# Patient Record
Sex: Female | Born: 1965
Health system: Southern US, Community
[De-identification: ages and names within clinical notes are randomized; demographics above are authoritative.]

## PROBLEM LIST (undated history)

## (undated) DIAGNOSIS — Z9889 Other specified postprocedural states: Secondary | ICD-10-CM

## (undated) DIAGNOSIS — F32A Depression, unspecified: Secondary | ICD-10-CM

## (undated) DIAGNOSIS — R112 Nausea with vomiting, unspecified: Secondary | ICD-10-CM

## (undated) DIAGNOSIS — K2270 Barrett's esophagus without dysplasia: Secondary | ICD-10-CM

## (undated) DIAGNOSIS — F329 Major depressive disorder, single episode, unspecified: Secondary | ICD-10-CM

## (undated) DIAGNOSIS — J449 Chronic obstructive pulmonary disease, unspecified: Secondary | ICD-10-CM

## (undated) DIAGNOSIS — I10 Essential (primary) hypertension: Secondary | ICD-10-CM

## (undated) DIAGNOSIS — J302 Other seasonal allergic rhinitis: Secondary | ICD-10-CM

## (undated) HISTORY — PX: CHOLECYSTECTOMY: SHX55

## (undated) HISTORY — DX: Barrett's esophagus without dysplasia: K22.70

## (undated) HISTORY — PX: ESOPHAGOGASTRODUODENOSCOPY: SHX1529

## (undated) HISTORY — PX: TUBAL LIGATION: SHX77

## (undated) HISTORY — PX: ABDOMINAL HYSTERECTOMY: SHX81

---

## 2000-12-04 ENCOUNTER — Other Ambulatory Visit: Admission: RE | Admit: 2000-12-04 | Discharge: 2000-12-04 | Payer: Self-pay | Admitting: Obstetrics and Gynecology

## 2001-01-19 ENCOUNTER — Encounter (INDEPENDENT_AMBULATORY_CARE_PROVIDER_SITE_OTHER): Payer: Self-pay

## 2001-01-19 ENCOUNTER — Ambulatory Visit (HOSPITAL_COMMUNITY): Admission: RE | Admit: 2001-01-19 | Discharge: 2001-01-19 | Payer: Self-pay | Admitting: Obstetrics and Gynecology

## 2001-06-09 ENCOUNTER — Observation Stay (HOSPITAL_COMMUNITY): Admission: RE | Admit: 2001-06-09 | Discharge: 2001-06-10 | Payer: Self-pay | Admitting: Obstetrics and Gynecology

## 2001-06-09 ENCOUNTER — Encounter (INDEPENDENT_AMBULATORY_CARE_PROVIDER_SITE_OTHER): Payer: Self-pay | Admitting: Specialist

## 2001-09-24 ENCOUNTER — Ambulatory Visit (HOSPITAL_COMMUNITY): Admission: RE | Admit: 2001-09-24 | Discharge: 2001-09-24 | Payer: Self-pay | Admitting: Unknown Physician Specialty

## 2001-09-24 ENCOUNTER — Encounter: Payer: Self-pay | Admitting: Unknown Physician Specialty

## 2003-04-11 ENCOUNTER — Emergency Department (HOSPITAL_COMMUNITY): Admission: EM | Admit: 2003-04-11 | Discharge: 2003-04-11 | Payer: Self-pay | Admitting: Emergency Medicine

## 2003-05-18 ENCOUNTER — Other Ambulatory Visit (HOSPITAL_COMMUNITY): Admission: RE | Admit: 2003-05-18 | Discharge: 2003-05-31 | Payer: Self-pay | Admitting: Psychiatry

## 2003-10-17 ENCOUNTER — Other Ambulatory Visit (HOSPITAL_COMMUNITY): Admission: RE | Admit: 2003-10-17 | Discharge: 2003-10-31 | Payer: Self-pay | Admitting: Psychiatry

## 2003-11-14 ENCOUNTER — Ambulatory Visit (HOSPITAL_COMMUNITY): Admission: RE | Admit: 2003-11-14 | Discharge: 2003-11-14 | Payer: Self-pay | Admitting: Family Medicine

## 2004-04-26 ENCOUNTER — Ambulatory Visit (HOSPITAL_COMMUNITY): Admission: RE | Admit: 2004-04-26 | Discharge: 2004-04-26 | Payer: Self-pay | Admitting: Unknown Physician Specialty

## 2004-05-14 ENCOUNTER — Ambulatory Visit (HOSPITAL_COMMUNITY): Admission: RE | Admit: 2004-05-14 | Discharge: 2004-05-14 | Payer: Self-pay | Admitting: Unknown Physician Specialty

## 2004-06-13 ENCOUNTER — Ambulatory Visit (HOSPITAL_COMMUNITY): Admission: RE | Admit: 2004-06-13 | Discharge: 2004-06-13 | Payer: Self-pay | Admitting: Neurology

## 2004-09-10 ENCOUNTER — Inpatient Hospital Stay (HOSPITAL_COMMUNITY): Admission: AD | Admit: 2004-09-10 | Discharge: 2004-09-12 | Payer: Self-pay | Admitting: Family Medicine

## 2004-12-19 ENCOUNTER — Ambulatory Visit (HOSPITAL_COMMUNITY): Admission: RE | Admit: 2004-12-19 | Discharge: 2004-12-19 | Payer: Self-pay | Admitting: Preventative Medicine

## 2005-03-05 ENCOUNTER — Ambulatory Visit (HOSPITAL_COMMUNITY): Admission: RE | Admit: 2005-03-05 | Discharge: 2005-03-05 | Payer: Self-pay | Admitting: Family Medicine

## 2005-06-06 ENCOUNTER — Ambulatory Visit (HOSPITAL_COMMUNITY): Admission: RE | Admit: 2005-06-06 | Discharge: 2005-06-06 | Payer: Self-pay | Admitting: Family Medicine

## 2005-06-17 ENCOUNTER — Ambulatory Visit (HOSPITAL_COMMUNITY): Admission: RE | Admit: 2005-06-17 | Discharge: 2005-06-17 | Payer: Self-pay | Admitting: Family Medicine

## 2005-07-31 ENCOUNTER — Inpatient Hospital Stay (HOSPITAL_COMMUNITY): Admission: AD | Admit: 2005-07-31 | Discharge: 2005-08-02 | Payer: Self-pay | Admitting: Family Medicine

## 2006-01-09 ENCOUNTER — Ambulatory Visit (HOSPITAL_COMMUNITY): Admission: RE | Admit: 2006-01-09 | Discharge: 2006-01-09 | Payer: Self-pay | Admitting: Family Medicine

## 2006-06-04 ENCOUNTER — Inpatient Hospital Stay (HOSPITAL_COMMUNITY): Admission: AD | Admit: 2006-06-04 | Discharge: 2006-06-06 | Payer: Self-pay | Admitting: Family Medicine

## 2006-09-01 ENCOUNTER — Ambulatory Visit (HOSPITAL_COMMUNITY): Admission: RE | Admit: 2006-09-01 | Discharge: 2006-09-01 | Payer: Self-pay | Admitting: Family Medicine

## 2010-10-14 ENCOUNTER — Encounter: Payer: Self-pay | Admitting: Family Medicine

## 2011-02-08 NOTE — H&P (Signed)
NAMEKRISLYN, [name]                 ACCOUNT NO.:  [id number]   MEDICAL RECORD NO.:  [id number]          PATIENT TYPE:  INP   LOCATION:  A315                          FACILITY:  [facility]   PHYSICIAN:  Donna Bernard, M.D.DATE OF BIRTH:  1966-04-15   DATE OF ADMISSION:  09/10/2004  DATE OF DISCHARGE:  LH                                HISTORY & PHYSICAL   CHIEF COMPLAINT:  Can't breathe..   SUBJECTIVE:  This patient is a 45 year old female with a history of known  asthma and an unfortunate chronic smoking habit, who arrives into the office  the date of admission with complaints of trouble breathing.  The patient has  been seen twice over the past couple of weeks with chest cold-type symptoms.  Most recently she was given a course of Levaquin along with prednisone  taper, along with frequent Ventolin via metered-dose inhaler along with  p.r.n. nebulizer treatment.  The patient states the last two nights she has  slept sitting up.  She has air hunger.  She basically was afraid last night  that she would have to go to the emergency room.  She presents to the office  today and was seen by our nurse practitioner and given a nebulizer treatment  and continued to remain quite tight after this.  The patient reports no  significant fever.  She has had a cough that is productive of yellowish  phlegm at times.  The patient claims compliance with her current  medications, which include Zoloft 200 mg daily, Lamictal 100 mg b.i.d.,  Seroquel 100 mg p.o. q.a.m. and 300 mg p.o. q.h.s., Advair 250/50 mcg one  puff b.i.d., Vistaril 25 mg up to q.i.d. p.r.n. for anxiety.   Prior surgeries:  Remote cholecystectomy, hysterectomy, tubal ligation.   FAMILY HISTORY:  Positive for hypertension and colon cancer.   ALLERGIES:  PENICILLIN, rash; ZANTAC, rash.   Occupation:  Works at [employer].  Tobacco use:  Positive at  approximately a pack a day.  Alcohol intake:  None significant.  No drug  use.   REVIEW OF SYSTEMS:  Otherwise negative.   PHYSICAL EXAMINATION:  VITAL SIGNS:  BP 140/90, afebrile.  GENERAL:  Alert.  Increased respiratory rate.  HEENT:  Moderate nasal congestion.  TMs normal.  Pharynx normal.  NECK:  Supple.  LUNGS:  Impressive expiratory wheezes with minimal change after nebulizer  treatment.  CARDIAC:  Slight tachycardia.  Positive mild flow murmur.  ABDOMEN:  Soft.  EXTREMITIES:  Normal.   SIGNIFICANT LABORATORY DATA:  Chest x-ray:  Chronic bronchitis-type changes.  No true infiltrate.  CBC:  White blood count elevated at 15,000.  O2  saturation is pending.   IMPRESSION:  Exacerbation of asthma with bronchitis equivalent and failure  on outpatient therapy.   PLAN:  Admit for IV antibiotics, steroids, frequent nebulizer treatments.  Further orders as noted in the chart.     [name]   WSL/MEDQ  D:  09/10/2004  T:  09/11/2004  Job:  [id number]

## 2011-02-08 NOTE — Op Note (Signed)
[facility] of [facility]  Patient:    [name], [name] Visit Number: [id number] MRN: [id number]          Service Type: OBV Location: 9300 [id number] Attending Physician:  Rhina Brackett Dictated by:   Duke Salvia. Marcelle Overlie, M.D. Proc. Date: 06/09/01 Admit Date:  06/09/2001                             Operative Report  PREOPERATIVE DIAGNOSES:       Abnormal uterine bleeding unresponsive to conservative measures, endometriosis.  POSTOPERATIVE DIAGNOSES:      Abnormal uterine bleeding unresponsive to conservative measures, endometriosis.  PROCEDURE:                    Diagnostic laparoscopy followed by total vaginal hysterectomy.  SURGEON:                      Duke Salvia. Marcelle Overlie, M.D.  ASSISTANT:                    Juluis Mire, M.D.  ANESTHESIA:                   General endotracheal.  COMPLICATIONS:                None.  DRAINS:                       Foley catheter.  ESTIMATED BLOOD LOSS:         150 cc.  PROCEDURE:                    ______ patient to the operating room after an adequate level of general endotracheal anesthesia was obtained, with the patients legs in stirrups the abdomen, perineum, and vagina were prepped and draped in the usual manner for laparoscopy, vaginal hysterectomy.  With the legs in stirrups Foley catheter was positioned draining clear urine.  After EUA revealed the uterus to be mid position, mobile, adnexa negative. Attention was directed to the laparoscopy portion of the procedure.  A 2 cm subumbilical incision was made.  The Veress needle was introduced without difficulty.  Its intra-abdominal position was verified by pressure and water testing.  After a 2 L pneumoperitoneum was then created laparoscopic trocar and ______ were then introduced without difficulty.  In Trendelenburg the patient was noted to have minimal fibrotic areas around the left uterosacral ligament that were noted previously.  The previously noted  omental adhesions and to the area of the left adnexa were free and clear.  Both ovaries were unremarkable.  Decision was made to proceed with straight TVH at that point. The legs were extended.  Weighted speculum was positioned.  Cervical vaginal mucosa was incised.  Posterior culdotomy performed without difficulty.  The left uterosacral ligament was then clamped, divided, and suture ligated with 0 Dexon in a Heaney fashion, held temporarily, the same repeated on the opposite side.  The bladder was advanced superiorly with sharp and blunt dissection until the anterior peritoneum could be identified.  This was entered without difficulty.  Retractor used to gently elevate the bladder out of the field. In a sequential manner staying close to the uterus, the cardinal ligament, uterine vasculature pedicles, and upper broad ligament and pedicles were clamped, divided, and suture ligated with 0 Dexon.  The fundus of the uterus was then delivered posteriorly.  The utero-ovarian pedicles were clamped, divided, first free tie followed by suture ligature of 0 Dexon.  Both ovarian pedicles were hemostatic.  Cuff was closed with running locked 2-0 Dexon suture.  All major pedicles were inspected and were hemostatic.  McCalls culdoplasty suture was placed from left uterosacral ligament picking up posterior peritoneum across to the right uterosacral ligament and tied down. Prior to closure sponge, needle, and instrument counts were reported as correct x 2.  The cuff closed from right to left with a figure-of-eight 2-0 Monocryl sutures.  Catheter was repositioned again draining clear urine.  She was reinsufflated and inspected.  The area of the vaginal cuff was hemostatic after irrigation and aspiration.  Pneumoperitoneum was deflated and the laparoscopy incision closed with a 2-0 Dexon subcuticular.  She tolerated this well.  Went to recovery room in good condition. Dictated by:   Duke Salvia. Marcelle Overlie,  M.D. Attending Physician:  Rhina Brackett DD:  06/09/01 TD:  06/09/01 Job: [id number]

## 2011-02-08 NOTE — Op Note (Signed)
[facility] of [facility]  Patient:    [name], [name]                        MRN: [id number] Proc. Date: 01/19/01 Adm. Date:  [id number] Attending:  Rhina Brackett                           Operative Report  PREOPERATIVE DIAGNOSIS:       Abnormal uterine bleeding, chronic pelvic pain.  POSTOPERATIVE DIAGNOSIS:      Abnormal uterine bleeding, chronic pelvic pain,                               plus left adnexal adhesions.  OPERATION:  SURGEON:                      Duke Salvia. Marcelle Overlie, M.D.  ANESTHESIA:                   General endotracheal.  COMPLICATIONS:                None.  DRAINS:                       In and out catheter.  DESCRIPTION OF PROCEDURE AND FINDINGS:                 The patient was taken to the operating room. After an adequate level of general endotracheal anesthesia was obtained with the legs in the stirrups, the abdomen, perineum, and vagina were prepped and draped in the usual manner for laparoscopy/hysteroscopy.  The bladder was drained and EUA carried out.  The uterus was in mid position, normal size, and adnexa negative.  A weighted speculum was positioned.  The cervix was grasped with a tenaculum and was sounded to 8 cm, progressively dilated to a 27 Pratt dilator.  The diagnostic hysteroscope was then inserted without difficulty revealing two normal ostia.  The fundus was normal.  The cavity appeared to be entirely normal.  Brief D&C carried out just to obtain an endometrial biopsy. The Hulka tenaculum was positioned and attention directed to the abdomen where a 2 cm subumbilical incision was made.  This was done after infiltrating with 0.5% Marcaine plain.  The Veress needle was introduced without difficulty. Its intra-abdominal position was verified by pressure and water testing. After a 2 L pneumoperitoneum was created, the laparoscopic trocar and sleeve were then introduced.  Three fingerbreadths above the symphysis in the  midline, a second puncture was made with a 5 mm trocar, and a blunt probe was inserted.  This was done under direct visualization.  With the patient in Trendelenburg and the uterus anteflexed, the pelvic findings were as follows; the anterior space was unremarkable.  The uterus itself was normal size and contour.  The right tube and ovary were normal.  Both tubes had been surgically interrupted with fallopian rings.  The right pelvic side wall was unremarkable.  The cul-de-sac was free and clear.  There was one area lateral to the left uterosacral ligament showing some old fibrosis that may have been secondary to prior endometriosis.  There was a solitary omental adhesion that appeared to be avascular and to the area of the fallopian ring which was lysed in a vascular plane.  Once this was freed up, the tube and  ovary were inspected and noted to be unremarkable except for evidence of the prior tubal.  The appendix and upper abdomen were unremarkable.  After these findings were noted and the solitary adhesion was lysed, instruments were removed, gas allowed to escape, and the defects closed with 4-0 Dexon subcuticular sutures.  She tolerated this well and went to the recovery room in good condition. DD:  01/19/01 TD:  01/19/01 Job: [id number]

## 2011-02-08 NOTE — H&P (Signed)
[name], [name]                 ACCOUNT NO.:  [id number]   MEDICAL RECORD NO.:  [id number]          PATIENT TYPE:  INP   LOCATION:  A321                          FACILITY:  [facility]   PHYSICIAN:  [name], M.D.DATE OF BIRTH:  28-Dec-1965   DATE OF ADMISSION:  07/31/2005  DATE OF DISCHARGE:  LH                                HISTORY & PHYSICAL   CHIEF COMPLAINT:  Coughing, wheezing, cannot breathe.   SUBJECTIVE:  This patient is a 45 year old white female with a history of  known asthma and bipolar disease who presents to the office the day of  admission with complaints of severe shortness of breath.  The patient was  seen in the office two days prior.  At that time, she was having cough,  wheezing, congestion, sore throat.  She was noted to be wheezing  significantly.  She had come off her Advair recently.  Dr. Lorin Picket placed her  on Levaquin 750 mg daily.  The patient was placed on a prednisone taper.  She was encouraged to get back on her Advair, she was encouraged to stop  smoking.  The next 48 hours, the patient continued to worsen.  She has had  significant coughing.  She states she has been up all night wheezing.  She  is compliant with her other medications.  The psychotropic medications are  administered via the psychiatrist.   CURRENT MEDICATIONS:  1.  Zoloft 200 mg daily.  2.  Lamictal 200 mg b.i.d.  3.  Seroquel 50 mg q.a.m. and at lunch, at 4 p.m., 300 mg q.h.s.  4.  Advair 250/50 one puff b.i.d.  5.  Depakote 500 mg q.h.s.  6.  Cogentin 2 mg one p.o. q.h.s.   PAST SURGICAL HISTORY:  1.  Remote cholecystectomy.  2.  Hysterectomy.  3.  Tubal ligation.   FAMILY HISTORY:  Significant for colon cancer, hypertension.   ALLERGIES:  PENICILLIN.   SOCIAL HISTORY:  The patient smokes a pack per day.  She works at  [employer].  No alcohol abuse.   REVIEW OF SYSTEMS:  Otherwise negative.   PHYSICAL EXAMINATION:  VITAL SIGNS:  Temperature 99.4, blood pressure  130/80.  HEENT:  Moderate nasal congestion.  Pharynx normal.  NECK:  Supple.  LUNGS:  Positive tachypnea, positive expiratory wheezes, some inspiratory  crackles.  HEART:  Regular rate and rhythm.  ABDOMEN:  Soft.  EXTREMITIES:  Normal.  NEUROLOGIC:  Intact.   LABORATORY DATA:  Blood work pending.  Chest x-ray shows bronchial asthmatic  changes.   IMPRESSION:  1.  Exacerbation of asthma with bronchitis, failure on outpatient therapy.  2.  Bipolar disease.  3.  Chronic smoker.   PLAN:  The patient is encouraged to quit smoking again.  Admit for  intravenous fluids, steroid therapy, frequent nebulizer treatments, further  orders as noted in the chart.      [name], M.D.  Electronically Signed     WSL/MEDQ  D:  07/31/2005  T:  07/31/2005  Job:  [id number]

## 2011-02-08 NOTE — H&P (Signed)
[facility]  Patient:    [name], [name] Visit Number: [id number] MRN: [id number]          Service Type: Attending:  Duke Salvia. Marcelle Overlie, M.D. Dictated by:   Duke Salvia. Marcelle Overlie, M.D. Adm. Date:  06/09/01                           History and Physical  CHIEF COMPLAINT:              Abnormal uterine bleeding unresponsive to conservative measures.  HISTORY OF PRESENT ILLNESS:   A 45 year old G2, P2 prior tubal ligation who has had a one year history of pelvic pain and heavy irregular bleeding that did not respond to OCPs.  In April 2002 she underwent diagnostic laparoscopy and hysteroscopy.  Findings were normal except for some minimal fibrosis around the left uterosacral ligament suggestive of endometriosis and some omental adhesions at the site of her prior Falope ring application.  Due to the significant problems she continues to have, she presents for definitive LAVH.  ALLERGIES:                    PENICILLIN, BENADRYL, CODEINE PRODUCTS.  PAST OBSTETRICAL HISTORY:     Two vaginal deliveries at term.  PAST SURGICAL HISTORY:        Tubal ligation, cholecystectomy in 1995, laparoscopy, hysteroscopy.  REVIEW OF SYSTEMS:            Significant for mild asthma for which she uses an inhaler p.r.n.  MEDICATIONS:                  No regular medicines.  PHYSICAL EXAMINATION  VITAL SIGNS:                  Temperature 98.2, blood pressure 120/78.  HEENT:                        Unremarkable.  NECK:                         Supple without masses.  LUNGS:                        Clear.  CARDIOVASCULAR:               Regular rate and rhythm without murmurs, rubs, or gallops.  BREASTS:                      Without masses.  ABDOMEN:                      Soft, flat, nontender.  PELVIC:                       Normal external genitalia.  Vagina and cervix: Clear.  Uterus:  Mid position, normal size.  Adnexa:  Negative.  IMPRESSION:                   1.  Chronic pelvic pain, abnormal uterine                                  bleeding unresponsive to conservative  measures.                               2. Pelvic adhesions noted on recent laparoscopy                                  and also evidence of endometriosis.  PLAN:                         LAVH.  Procedure and risks reviewed as above. Dictated by:   Duke Salvia. Marcelle Overlie, M.D. Attending:  Duke Salvia. Marcelle Overlie, M.D. DD:  06/04/01 TD:  06/04/01 Job: [id number]

## 2011-02-08 NOTE — Discharge Summary (Signed)
NAMESVEA, PUSCH                 ACCOUNT NO.:  [id number]   MEDICAL RECORD NO.:  [id number]          PATIENT TYPE:  INP   LOCATION:  A321                          FACILITY:  [facility]   PHYSICIAN:  Scott A. Gerda Diss, MD    DATE OF BIRTH:  [birth year]   DATE OF ADMISSION:  07/31/2005  DATE OF DISCHARGE:  11/10/2006LH                                 DISCHARGE SUMMARY   DISCHARGE DIAGNOSES:  1.  Asthma.  2.  Chronic obstructive pulmonary disease.  3.  Tobacco use.   HOSPITAL COURSE:  A 45 year old female admitted in with severe coughing,  congestion and wheezing with some relative hypoxia also having difficulty  with breathing.  She had been treated as an outpatient for the past couple  of weeks with frequent rounds of steroids and nebulizer treatments without  success.  The patient does smoke.  She understands that it would be in her  best interest to quit smoking and that she is destine to have severe lung  problems as she goes on if she does not.  No pneumonia was seen.  The  patient responded very well to the IV steroids and nebulizer treatments and  was felt the patient was stable on November 10, to go home.  She was  discharged on nebulizer treatments as well as steroids and Zithromax.  She  is to follow up in 3-4 days if not continuing to stay dramatically stay  well.  If she continues on her present course and does really well, then she  is to follow up in 1 week to be rechecked.   DISCHARGE MEDICATIONS:  1.  Prednisone taper.  When she finishes, go to Advair twice daily.  2.  Xopenex, that she has at home, every 6-8 hours.  3.  Albuterol inhaler as needed when she is out and about.  4.  Zithromax for the next 5 days as directed.   SPECIAL INSTRUCTIONS:  She is not to smoke.  She was instructed to call us  if she gets worse.      Scott A. Gerda Diss, MD  Electronically Signed     SAL/MEDQ  D:  08/02/2005  T:  08/02/2005  Job:  [id number]

## 2011-02-08 NOTE — Discharge Summary (Signed)
[facility]  Patient:    [name], [name] Visit Number: [id number] MRN: [id number]          Service Type: OBV Location: 9300 [id number] [id number] Attending Physician:  Rhina Brackett Dictated by:   Duke Salvia. Marcelle Overlie, M.D. Admit Date:  06/09/2001 Disc. Date: 06/10/01                             Discharge Summary  DISCHARGE DIAGNOSES:          1. Abnormal uterine bleeding.                               2. Diagnostic laparoscopy followed by                                  total vaginal hysterectomy this admission.  SUMMARY OF THE HISTORY AND PHYSICAL EXAMINATION:     Please see admission history and physical for details. Briefly, this is a 45 year old, G2, P2, prior tubal with a one-year history of pelvic pain and heavy irregular bleeding, previous laparoscopy earlier this year showing some adnexal adhesions and fibrotic changes of old endometriosis. She presents now for definitive hysterectomy.  HOSPITAL COURSE:              On June 09, 2001 under general anesthesia, the patient underwent laparoscopy followed by [facility]. On the first postoperative day she was tolerating a regular diet and was afebrile. The catheter had been removed the night before. She was voiding without difficulty, felt well, and was ready for discharge at that point. Her abdominal exam was unremarkable.  LABORATORY DATA:              Preoperative hemoglobin 14.7, postoperative on September 18 was 10.8. Blood type was A positive, antibody screen negative. Admission UA was negative. Pathology report is still pending.  DISPOSITION:                  The patient was discharged.  DISCHARGE MEDICATIONS:        1. Tylox p.r.n. pain.                               2. Hemocyte once daily for a month.  DISCHARGE FOLLOWUP:           The patient  is to return to the office in one week.  DISCHARGE INSTRUCTIONS:       She was advised to report any incisional redness or drainage, increased  pain or bleeding, or fever over 101. She was given specific instructions regarding diet, sex, and exercise.  CONDITION:                    Good.  ACTIVITY:                     Gradually increase. Dictated by:   Duke Salvia. Marcelle Overlie, M.D. Attending Physician:  Rhina Brackett DD:  06/10/01 TD:  06/10/01 Job: [phone] [id number]

## 2011-02-08 NOTE — Discharge Summary (Signed)
[name], [name]                 ACCOUNT NO.:  [id number]   MEDICAL RECORD NO.:  [id number]          PATIENT TYPE:  INP   LOCATION:  A315                          FACILITY:  [facility]   PHYSICIAN:  Scott A. Gerda Diss, MD    DATE OF BIRTH:  11-Sep-1966   DATE OF ADMISSION:  DATE OF DISCHARGE:  12/21/2005LH                                 DISCHARGE SUMMARY   DISCHARGE DIAGNOSES:  1.  Walking pneumonia.  2.  Reactive airway/asthma flare-up.  3.  Hypoxia secondary to the above.   HOSPITAL COURSE:  This 45 year old white female was admitted in after having  a couple of week's worth of coughing and congestion, sinus drainage, being  treated as an outpatient with albuterol, prednisone and Levaquin.  She came  to the office in respiratory distress, progressively worse.  Failed  outpatient management.  Was admitted in on Solu-Medrol nebulization  treatments and antibiotics.  She gradually improved over the course of the  next several days.  Her x-ray did not show pneumonia, but showed bronchitic  changes.  Blood work was within order.  O2 saturations 91.  Placed on two  liters on the afternoon of the 20th and improved to the point where she did  not need steroids.  The patient states that she has decided to quit smoking.  She was advised she could use a patch if she wished.  It was felt that this  certainly was a good choice for her.  She was encouraged to follow up in our  office within two weeks, and sooner if any problems.   DISCHARGE CONDITION:  She was discharged in good condition.     Scot   SAL/MEDQ  D:  09/12/2004  T:  09/12/2004  Job:  [id number]

## 2011-02-08 NOTE — Discharge Summary (Signed)
[name], [name]                 ACCOUNT NO.:  [id number]   MEDICAL RECORD NO.:  [id number]          PATIENT TYPE:  INP   LOCATION:  A302                          FACILITY:  [facility]   PHYSICIAN:  Scott A. Gerda Diss, MD    DATE OF BIRTH:  [birth year]   DATE OF ADMISSION:  06/04/2006  DATE OF DISCHARGE:  09/14/2007LH                                 DISCHARGE SUMMARY   DISCHARGE DIAGNOSES:  1. Exacerbation of asthma with failure of outpatient therapy.  2. Allergic rhinitis.  3. Bronchitis.  4. Bipolar disease.   HOSPITAL COURSE:  The patient was admitted in with severe wheezing and  difficulty breathing.  She is a smoker.  She had been seen 3 times over 6  days in the office with severe wheezing and coughing, and she had been using  her neb treatments every couple of hours (Xopenex) without success.  She has  been on a shot of Depo-Medrol in the past and has been on Avelox and  prednisone and despite all this was getting worse.   DISCHARGE MEDICATIONS:  1. Prednisone taper from 60 mg to 20 mg over a 9-day course.  2. Zoloft 100 mg daily.  3. Lamictal 100 mg q.a.m.  4. Seroquel 100 mg q.h.s.  5. Depakote ER 1,000 mg q.h.s.  6. Singulair 10 mg daily.  7. Advair 500/50, one inhalation b.i.d.  8. Chantix.   The patient was encouraged to quit smoking.  She may also use a Xopenex as  frequently as necessary.  She is to follow up in the office within 7-14  days, plus also she is to follow up sooner if worse.  In addition, to this  the patient was also encouraged to notify us if any particular problems  should arise.   HOSPITAL COURSE:  The patient was treated with a combination of Xopenex,  Levaquin, and her usual medications and Advair and gradually improved over  the course of the next couple of days and was clear by June 06, 2006,  and stable for discharge.      Scott A. Gerda Diss, MD  Electronically Signed    SAL/MEDQ  D:  06/06/2006  T:  06/06/2006  Job:  [id number]

## 2011-02-08 NOTE — H&P (Signed)
[facility]  Patient:    [name], [name]                          MRN: [id number] Adm. Date:  01/19/01 Attending:  Duke Salvia. Marcelle Overlie, M.D.                         History and Physical  CHIEF COMPLAINT:  Menometrorrhagia, dysmenorrhea, history of endometriosis.  HISTORY OF PRESENT ILLNESS:  A 45 year old G2, P2, history of prior tubal ligation, was seen over the last six to nine months by several physicians with complaints of irregular bleeding and tried on different OCPs without significant improvement.  At the time of her tubal in 1996, she was told she had endometriosis.  Her problems lately have been worsening dysmenorrhea and heavy, irregular bleeding.  When I first saw her March 2002, her exam was normal.  At that time, we discussed several options with her, including Lupron or laparoscopy or further attempted medical management, which she declined. She has required Vioxx for pain and Loestrin 1/20 in an effort to regulate her AUB, and presents now for laparoscopy, hysteroscopy.  The risks of the procedure, including risks of bleeding, infection, adjacent organ injury, possible need for open or additional surgery, were all discussed with her. Laparoscopic techniques for lysis of adhesions and ablation of endometriosis reviewed with her.  ALLERGIES:  PENICILLIN, BENADRYL, CODEINE PRODUCTS.  PAST SURGICAL HISTORY:  Tubal ligation, cholecystectomy.  REVIEW OF SYSTEMS:  Smokes one PPD, with a history of mild asthma, not currently on any medications.  OBSTETRICAL HISTORY:  Two vaginal deliveries without complications.  PHYSICAL EXAMINATION:  VITAL SIGNS:  Temperature 98.2, blood pressure 120/60.  HEENT:  Unremarkable.  NECK:  Supple without masses.  CHEST:  Lungs clear.  CARDIAC:  Regular rate and rhythm without murmurs, rubs, gallops or _____.  BREASTS:  Without masses.  ABDOMEN:  Soft, flat, nontender.  PELVIC:  Normal external  genitalia.  Vagina and cervix clear.  Uterus midposition, normal size.  Adnexa negative.  No unusual nodularity or tenderness.  IMPRESSION: 1. Chronic pelvic pain, dysmenorrhea. 2. Abnormal uterine bleeding. 3. History of endometriosis.  PLAN:  Diagnostic laparoscopy, hysteroscopy, dilatation and curettage. Procedure and risks discussed as above. DD:  01/15/01 TD:  01/16/01 Job: [id number]

## 2011-02-08 NOTE — H&P (Signed)
[name], [name]                 ACCOUNT NO.:  [id number]   MEDICAL RECORD NO.:  [id number]          PATIENT TYPE:  INP   LOCATION:  A302                          FACILITY:  [facility]   PHYSICIAN:  Donna Bernard, M.D.DATE OF BIRTH:  August 28, 1966   DATE OF ADMISSION:  06/04/2006  DATE OF DISCHARGE:  LH                                HISTORY & PHYSICAL   CHIEF COMPLAINT:  Wheezing.   SUBJECTIVE:  This patient is a 45 year old white female with history of  asthma and bipolar disease who presents to the office for the third time in  6 days regarding her shortness of breath and wheezing.  The patient states  she was up through most of the night coughing and wheezing considerably.  She was seen 6 days ago with several days' worth of cough, congestion, and  wheezing.  The patient was given Biaxin at that time along with frequent  Ventolin treatments and a prednisone taper.  She returned four days later  with worsening symptoms.  She was given a shot of Depo-Medrol and switched  to Avelox.  She now returns 2 days later stating that she had a very  difficult night last night as noted above and has had to move her breathing  treatments up to every several hours without success.  The patient notes no  vomiting, no diarrhea.   She is compliant with her current medications which include:  1. Prednisone 40 mg daily.  2. Zoloft 100 mg daily.  3. Lamictal 100 mg q.a.m.  4. Seroquel 100 mg q.h.s.  5. Depakote R 1,000 mg q.h.s.  6. Singulair 10 mg daily.   ALLERGIES:  PENICILLIN.   FAMILY HISTORY:  Positive for colon cancer and hypertension.   HOSPITAL SURGERIES:  1. Prior cholecystectomy.  2. Prior tubal ligation.  3. Prior hysterectomy.   SOCIAL HISTORY:  The patient works.  She is married.  Smokes unfortunately a  pack per day.  No alcohol use.   REVIEW OF SYSTEMS:  Otherwise negative.   PHYSICAL EXAMINATION:  VITAL SIGNS:  Stable, temp afebrile.  GENERAL:  Alert, moderate distress,  breathing somewhat rapidly.  HEENT:  Mild nasal congestion.  LUNGS:  Impressive expiratory wheezes with tachypnea.  Respiratory rate  around 24.  HEART:  Moderately tachycardic at 120.  ABDOMEN:  Soft.  No obvious tenderness.  EXTREMITIES:  Normal.  NEUROLOGIC:  Intact.   Of note, chest x-ray revealed chronic bronchitic changes, no acute changes.  Oxygen saturation 95%.  CBC:  White blood count 11,000.   Of note, the patient was also given nebulizer treatments in the office.  Recently her Ventolin was changed to Xopenex a couple of days ago because of  her side effects that occur which I think is a good idea which we will  maintain this.   IMPRESSION:  1. Exacerbation of asthma with failure on outpatient therapy.  Due to      multiple attempts at outpatient management along with a difficult night      last night, along with the patient's severe bronchospasm, I felt  admission to the hospital was warranted.  2. Bipolar disease.  3. Allergic rhinitis.   PLAN:  As per orders.      Donna Bernard, M.D.  Electronically Signed     WSL/MEDQ  D:  06/04/2006  T:  06/04/2006  Job:  [id number]

## 2011-02-08 NOTE — Group Therapy Note (Signed)
[name], [name]                 ACCOUNT NO.:  [id number]   MEDICAL RECORD NO.:  [id number]          PATIENT TYPE:  INP   LOCATION:  A321                          FACILITY:  [facility]   PHYSICIAN:  Scott A. Gerda Diss, MD    DATE OF BIRTH:  October 16, 1965   DATE OF PROCEDURE:  08/01/2005  DATE OF DISCHARGE:                                   PROGRESS NOTE   SUBJECTIVE:  The patient overall doing pretty good.  She is having a lot of  coughing and congestion, not feeling overly well.   OBJECTIVE:  LUNGS:  Bilateral expiratory wheezes, no rales.  HEART:  Regular.  VITAL SIGNS:  O2 saturations doing good.   ASSESSMENT:  The patient has had such a rough course over the past few weeks  and she has had such problems with coughing and wheezing that I do not feel  that the patient is reasonable to be treated at home and I think we need to  continue forward with the intensive therapy.   PLAN:  She will probably be here for another couple of days.      Scott A. Gerda Diss, MD  Electronically Signed     SAL/MEDQ  D:  08/01/2005  T:  08/01/2005  Job:  [id number]

## 2011-05-17 ENCOUNTER — Telehealth (INDEPENDENT_AMBULATORY_CARE_PROVIDER_SITE_OTHER): Payer: Self-pay | Admitting: *Deleted

## 2011-05-17 DIAGNOSIS — Z800 Family history of malignant neoplasm of digestive organs: Secondary | ICD-10-CM

## 2011-05-17 DIAGNOSIS — K625 Hemorrhage of anus and rectum: Secondary | ICD-10-CM

## 2011-05-17 NOTE — Telephone Encounter (Signed)
TCS sch'd 05/21/11 @ 12:30 (11:30), movi prep sample given

## 2011-05-20 MED ORDER — SODIUM CHLORIDE 0.45 % IV SOLN
Freq: Once | INTRAVENOUS | Status: AC
  Administered 2011-05-21: 12:00:00 via INTRAVENOUS

## 2011-05-21 ENCOUNTER — Encounter (HOSPITAL_COMMUNITY): Payer: Self-pay | Admitting: *Deleted

## 2011-05-21 ENCOUNTER — Encounter (HOSPITAL_COMMUNITY)
Admission: RE | Disposition: A | Discharge status: Home / Self Care | Payer: Self-pay | Source: Ambulatory Visit | Attending: Internal Medicine

## 2011-05-21 ENCOUNTER — Other Ambulatory Visit (INDEPENDENT_AMBULATORY_CARE_PROVIDER_SITE_OTHER): Payer: Self-pay | Admitting: Internal Medicine

## 2011-05-21 ENCOUNTER — Ambulatory Visit (HOSPITAL_COMMUNITY)
Admission: RE | Admit: 2011-05-21 | Discharge: 2011-05-21 | Disposition: A | Discharge status: Home / Self Care | Payer: Medicaid Other | Source: Ambulatory Visit | Attending: Internal Medicine | Admitting: Internal Medicine

## 2011-05-21 DIAGNOSIS — K648 Other hemorrhoids: Secondary | ICD-10-CM | POA: Insufficient documentation

## 2011-05-21 DIAGNOSIS — K922 Gastrointestinal hemorrhage, unspecified: Secondary | ICD-10-CM | POA: Insufficient documentation

## 2011-05-21 DIAGNOSIS — D126 Benign neoplasm of colon, unspecified: Secondary | ICD-10-CM | POA: Insufficient documentation

## 2011-05-21 HISTORY — DX: Major depressive disorder, single episode, unspecified: F32.9

## 2011-05-21 HISTORY — PX: COLONOSCOPY: SHX5424

## 2011-05-21 HISTORY — DX: Depression, unspecified: F32.A

## 2011-05-21 HISTORY — DX: Other seasonal allergic rhinitis: J30.2

## 2011-05-21 SURGERY — COLONOSCOPY
Anesthesia: Moderate Sedation

## 2011-05-21 MED ORDER — MIDAZOLAM HCL 5 MG/5ML IJ SOLN
INTRAMUSCULAR | Status: AC
  Filled 2011-05-21: qty 5

## 2011-05-21 MED ORDER — PROMETHAZINE HCL 25 MG/ML IJ SOLN
INTRAMUSCULAR | Status: DC | PRN
  Administered 2011-05-21: 12.5 mg via INTRAVENOUS

## 2011-05-21 MED ORDER — MEPERIDINE HCL 50 MG/ML IJ SOLN
INTRAMUSCULAR | Status: AC
  Filled 2011-05-21: qty 1

## 2011-05-21 MED ORDER — MIDAZOLAM HCL 5 MG/5ML IJ SOLN
INTRAMUSCULAR | Status: AC
  Filled 2011-05-21: qty 10

## 2011-05-21 MED ORDER — DICYCLOMINE HCL 10 MG PO CAPS: 10.0000 mg | ORAL_CAPSULE | Freq: Two times a day (BID) | ORAL | Status: DC

## 2011-05-21 MED ORDER — MIDAZOLAM HCL 5 MG/5ML IJ SOLN
INTRAMUSCULAR | Status: DC | PRN
  Administered 2011-05-21: 2 mg via INTRAVENOUS
  Administered 2011-05-21: 3 mg via INTRAVENOUS
  Administered 2011-05-21 (×2): 2 mg via INTRAVENOUS
  Administered 2011-05-21: 3 mg via INTRAVENOUS

## 2011-05-21 MED ORDER — MEPERIDINE HCL 50 MG/ML IJ SOLN
INTRAMUSCULAR | Status: DC | PRN
  Administered 2011-05-21 (×2): 25 mg via INTRAVENOUS

## 2011-05-21 MED ORDER — PROMETHAZINE HCL 25 MG/ML IJ SOLN
INTRAMUSCULAR | Status: AC
  Filled 2011-05-21: qty 1

## 2011-05-21 NOTE — Op Note (Signed)
COLONOSCOPY PROCEDURE REPORT  PATIENT:  [name]  MR#:  [id number] Birthdate:  01-May-1966, 45 y.o., female Endoscopist:  Dr. Malissa Hippo, MD Referred By:  Dr. Lilyan Punt, MD Procedure Date: 05/21/2011  Procedure:   Colonoscopy with snare polypectomy.  Indications:  Rectal bleeding. Family history of CRC in a 2nd degree relative.  Informed Consent:  Procedure and risks were reviewed with patient. Her questions were answered and informed consent obtained. Medications:  Demerol 50 mg IV Versed 12 mg IV Promethazine 12.5 mg IV in diluted form.  Description of procedure:  After a digital rectal exam was performed, that colonoscope was advanced from the anus through the rectum and colon to the area of the cecum, ileocecal valve and appendiceal orifice. The cecum was deeply intubated. These structures were well-seen and photographed for the record. From the level of the cecum and ileocecal valve, the scope was slowly and cautiously withdrawn. The mucosal surfaces were carefully surveyed utilizing scope tip to flexion to facilitate fold flattening as needed. The scope was pulled down into the rectum where a thorough exam including retroflexion was performed.  Findings:   Prep excellent. 3 mm polyp cold snared from a sending colon. 10 mm cherry-red polyp on a thick stack. It was snared and removed for histologic exam. Small external hemorrhoids.    Therapeutic/Diagnostic Maneuvers Performed:  See above  Complications:  None  Cecal Withdrawal Time:  8 minutes  Impression:  Examination performed to cecum.. 3 mm  polyp cold snared from ascending colon. 10 mm pedunculated polyp snared from sigmoid colon. Small external hemorrhoids. It is possible that the sigmoid colon polyp  has been bleeding.  Recommendations:  No aspirin for 10 days. Dicyclomine 10 mg twice daily as needed for lower abdominal pain. Physician will contact patient with biopsy results.  [name],[name]   05/21/2011 2:06 PM  CC: Dr. No primary provider on file. & Dr. No ref. provider found

## 2011-05-21 NOTE — H&P (Signed)
[name] is an 45 y.o. female.   Chief Complaint: here for colonoscopy [name] is 45 yo female who has been passing bright blood with bowel movements and at she passes pure blood and clots; c/o pain across lower abdomen. Family history  Positive for CRC in maternal grandfather; renal carcinoma in father; pancreatic carcinoma in maternal aunt and paternal grandfather.   Past Medical History  Diagnosis Date  . Asthma   . Depression   . Seasonal allergies     Past Surgical History  Procedure Date  . Abdominal hysterectomy   . Cholecystectomy   . Esophagogastroduodenoscopy     Family History  Problem Relation Age of Onset  . Colon cancer Other    Social History:  reports that she has been smoking Cigarettes.  She has a 11.5 pack-year smoking history. She does not have any smokeless tobacco history on file. She reports that she does not drink alcohol or use illicit drugs.  Allergies:  Allergies  Allergen Reactions  . Codeine Nausea And Vomiting and Rash  . Penicillins Nausea And Vomiting and Rash    Medications Prior to Admission  Medication Dose Route Frequency Provider Last Rate Last Dose  . 0.45 % sodium chloride infusion   Intravenous Once Malissa Hippo, MD 20 mL/hr at 05/21/11 1157    . meperidine (DEMEROL) 50 MG/ML injection           . midazolam (VERSED) 5 MG/5ML injection            Medications Prior to Admission  Medication Sig Dispense Refill  . albuterol (PROVENTIL HFA;VENTOLIN HFA) 108 (90 BASE) MCG/ACT inhaler Inhale 2 puffs into the lungs every 6 (six) hours as needed.        . beclomethasone (QVAR) 80 MCG/ACT inhaler Inhale 2 puffs into the lungs daily.        . cetirizine (ZYRTEC) 10 MG tablet Take 10 mg by mouth daily.        . sertraline (ZOLOFT) 100 MG tablet Take 100 mg by mouth daily.          No results found for this or any previous visit (from the past 48 hour(s)). No results found.  Review of Systems  Constitutional: Negative for  weight loss.  Gastrointestinal: Positive for abdominal pain (across lower abdomen). Negative for heartburn, nausea, vomiting, diarrhea, constipation and melena. Blood in stool: blood with BMs and inbetween as well as clots.  Genitourinary: Negative for dysuria.    Blood pressure 119/83, pulse 97, temperature 97.6 F (36.4 C), temperature source Oral, resp. rate 18, height 5\' 5"  (1.651 m), weight 142 lb (64.411 kg), SpO2 99.00%. Physical Exam  Constitutional: She appears well-developed and well-nourished.  HENT:  Mouth/Throat: Oropharynx is clear and moist.  Eyes: Conjunctivae are normal. No scleral icterus.  Neck: No thyromegaly present.  Cardiovascular: Normal rate, regular rhythm and normal heart sounds.   No murmur heard. Respiratory: Effort normal. Wheezes: few rhonchi both lung bases.  GI: She exhibits no distension. There is tenderness (mild tenderness across lower abdomen).  Musculoskeletal: She exhibits no edema.  Lymphadenopathy:    She has no cervical adenopathy.  Neurological: She is alert.  Skin: Skin is warm and dry.     Assessment/Plan Rectal Bleeding.  Colonoscopy.  AngelaNAJEEB Paul 05/21/2011, 1:27 PM

## 2011-05-28 ENCOUNTER — Encounter (INDEPENDENT_AMBULATORY_CARE_PROVIDER_SITE_OTHER): Payer: Self-pay | Admitting: *Deleted

## 2011-05-31 ENCOUNTER — Encounter (HOSPITAL_COMMUNITY): Payer: Self-pay | Admitting: Internal Medicine

## 2012-01-08 ENCOUNTER — Encounter (INDEPENDENT_AMBULATORY_CARE_PROVIDER_SITE_OTHER): Payer: Self-pay

## 2012-05-15 ENCOUNTER — Emergency Department (HOSPITAL_COMMUNITY)
Admission: EM | Admit: 2012-05-15 | Discharge: 2012-05-15 | Disposition: A | Discharge status: Home / Self Care | Payer: Self-pay | Attending: Emergency Medicine | Admitting: Emergency Medicine

## 2012-05-15 ENCOUNTER — Encounter (HOSPITAL_COMMUNITY): Payer: Self-pay | Admitting: *Deleted

## 2012-05-15 DIAGNOSIS — W57XXXA Bitten or stung by nonvenomous insect and other nonvenomous arthropods, initial encounter: Secondary | ICD-10-CM

## 2012-05-15 DIAGNOSIS — S90569A Insect bite (nonvenomous), unspecified ankle, initial encounter: Secondary | ICD-10-CM | POA: Insufficient documentation

## 2012-05-15 DIAGNOSIS — R51 Headache: Secondary | ICD-10-CM | POA: Insufficient documentation

## 2012-05-15 DIAGNOSIS — R1110 Vomiting, unspecified: Secondary | ICD-10-CM | POA: Insufficient documentation

## 2012-05-15 MED ORDER — CLINDAMYCIN HCL 150 MG PO CAPS: 150.0000 mg | ORAL_CAPSULE | Freq: Four times a day (QID) | ORAL | Status: AC

## 2012-05-15 MED ORDER — ONDANSETRON 8 MG PO TBDP: 8.0000 mg | ORAL_TABLET | Freq: Three times a day (TID) | ORAL | Status: AC | PRN

## 2012-05-15 MED ORDER — ONDANSETRON 8 MG PO TBDP
8.0000 mg | ORAL_TABLET | Freq: Once | ORAL | Status: AC
  Administered 2012-05-15: 8 mg via ORAL
  Filled 2012-05-15: qty 1

## 2012-05-15 MED ORDER — DIPHENHYDRAMINE HCL 25 MG PO CAPS
25.0000 mg | ORAL_CAPSULE | Freq: Once | ORAL | Status: AC
  Administered 2012-05-15: 25 mg via ORAL
  Filled 2012-05-15: qty 1

## 2012-05-15 MED ORDER — CLINDAMYCIN HCL 150 MG PO CAPS
300.0000 mg | ORAL_CAPSULE | Freq: Once | ORAL | Status: AC
  Administered 2012-05-15: 300 mg via ORAL
  Filled 2012-05-15: qty 2

## 2012-05-15 NOTE — ED Provider Notes (Signed)
History     CSN: [id number]  Arrival date & time 05/15/12  1827   First MD Initiated Contact with Patient 05/15/12 1844      Chief Complaint  Patient presents with  . Insect Bite    (Consider location/radiation/quality/duration/timing/severity/associated sxs/prior treatment) HPI   Patient with bug bite to lle yesterday and removed stinger.  Patient states it has gotten redder and more tender today.  She had a headache this am and vomited twice this afternoon and is concerned that it is due to a spider bite.  Patient was out in tobacco field when she felt something but later noted a stinger imbedded after getting home and taking pants off.  She has had large local reactions to bees but no anaphylaxis.   Past Medical History  Diagnosis Date  . Asthma   . Depression   . Seasonal allergies     Past Surgical History  Procedure Date  . Abdominal hysterectomy   . Cholecystectomy   . Esophagogastroduodenoscopy   . Colonoscopy 05/21/2011    Procedure: COLONOSCOPY;  Surgeon: Malissa Hippo, MD;  Location: [facility];  Service: Endoscopy;  Laterality: N/A;  12:30    Family History  Problem Relation Age of Onset  . Colon cancer Other     History  Substance Use Topics  . Smoking status: Current Everyday Smoker -- 0.5 packs/day for 23 years    Types: Cigarettes  . Smokeless tobacco: Not on file  . Alcohol Use: No    OB History    Grav Para Term Preterm Abortions TAB SAB Ect Mult Living                  Review of Systems  All other systems reviewed and are negative.    Allergies  Codeine and Penicillins  Home Medications   Current Outpatient Rx  Name Route Sig Dispense Refill  . ALBUTEROL SULFATE HFA 108 (90 BASE) MCG/ACT IN AERS Inhalation Inhale 2 puffs into the lungs every 6 (six) hours as needed.      . BECLOMETHASONE DIPROPIONATE 80 MCG/ACT IN AERS Inhalation Inhale 2 puffs into the lungs daily.      Marland Kitchen CETIRIZINE HCL 10 MG PO TABS Oral Take 10 mg by mouth  daily.      Marland Kitchen DICYCLOMINE HCL 10 MG PO CAPS Oral Take 1 capsule (10 mg total) by mouth 2 (two) times daily. 60 capsule 2    Before breakfast and evening meals.  . SERTRALINE HCL 100 MG PO TABS Oral Take 100 mg by mouth daily.        BP 124/78  Pulse 94  Temp 98 F (36.7 C) (Oral)  Resp 20  Ht 5\' 5"  (1.651 m)  SpO2 98%  Physical Exam  Nursing note and vitals reviewed. Constitutional: She is oriented to person, place, and time. She appears well-developed and well-nourished.  HENT:  Head: Normocephalic and atraumatic.  Eyes: Conjunctivae are normal. Pupils are equal, round, and reactive to light.  Neck: Normal range of motion. Neck supple.  Cardiovascular: Normal rate and regular rhythm.   Pulmonary/Chest: Effort normal and breath sounds normal.  Abdominal: Soft. Bowel sounds are normal.  Musculoskeletal:       lle extremity with 4 cm diameter area of redness mildly tender without pus or fluctuance with central excoriation.    Neurological: She is alert and oriented to person, place, and time.  Skin: Skin is warm.  Psychiatric: She has a normal mood and affect.  ED Course  Procedures (including critical care time)  Labs Reviewed - No data to display No results found.   No diagnosis found.    MDM  Patient without any vomiting here. Her abdomen is soft and nontender. Her lungs are clear. The area appears consistent with insect bite with a central excoriation. However with the increasing erythema she will be placed on antibiotics and given Benadryl. She is advised regarding signs and symptoms of worsening infection and to return if the redness is spreading, pain is worsening, or she is unable to keep her antibiotics down.        Hilario Quarry, MD 05/15/12 [phone]

## 2012-05-15 NOTE — ED Notes (Signed)
Insect bite to lt ankle,last pm.  Tender to touch, since then headache, and vomiting, abd cramping

## 2012-11-23 ENCOUNTER — Encounter (HOSPITAL_COMMUNITY): Payer: Self-pay | Admitting: *Deleted

## 2012-11-23 ENCOUNTER — Emergency Department (HOSPITAL_COMMUNITY)
Admission: EM | Admit: 2012-11-23 | Discharge: 2012-11-23 | Disposition: A | Discharge status: Home / Self Care | Payer: Self-pay | Attending: Emergency Medicine | Admitting: Emergency Medicine

## 2012-11-23 DIAGNOSIS — Z888 Allergy status to other drugs, medicaments and biological substances status: Secondary | ICD-10-CM | POA: Insufficient documentation

## 2012-11-23 DIAGNOSIS — F3289 Other specified depressive episodes: Secondary | ICD-10-CM | POA: Insufficient documentation

## 2012-11-23 DIAGNOSIS — J45909 Unspecified asthma, uncomplicated: Secondary | ICD-10-CM | POA: Insufficient documentation

## 2012-11-23 DIAGNOSIS — R21 Rash and other nonspecific skin eruption: Secondary | ICD-10-CM | POA: Insufficient documentation

## 2012-11-23 DIAGNOSIS — T471X5A Adverse effect of other antacids and anti-gastric-secretion drugs, initial encounter: Secondary | ICD-10-CM | POA: Insufficient documentation

## 2012-11-23 DIAGNOSIS — L509 Urticaria, unspecified: Secondary | ICD-10-CM | POA: Insufficient documentation

## 2012-11-23 DIAGNOSIS — L299 Pruritus, unspecified: Secondary | ICD-10-CM | POA: Insufficient documentation

## 2012-11-23 DIAGNOSIS — F329 Major depressive disorder, single episode, unspecified: Secondary | ICD-10-CM | POA: Insufficient documentation

## 2012-11-23 DIAGNOSIS — F17200 Nicotine dependence, unspecified, uncomplicated: Secondary | ICD-10-CM | POA: Insufficient documentation

## 2012-11-23 DIAGNOSIS — T7840XA Allergy, unspecified, initial encounter: Secondary | ICD-10-CM

## 2012-11-23 DIAGNOSIS — R0602 Shortness of breath: Secondary | ICD-10-CM | POA: Insufficient documentation

## 2012-11-23 DIAGNOSIS — Z79899 Other long term (current) drug therapy: Secondary | ICD-10-CM | POA: Insufficient documentation

## 2012-11-23 MED ORDER — DIPHENHYDRAMINE HCL 50 MG/ML IJ SOLN
25.0000 mg | Freq: Once | INTRAMUSCULAR | Status: AC
Start: 2012-11-23 — End: 2012-11-23
  Administered 2012-11-23: 25 mg via INTRAVENOUS
  Filled 2012-11-23: qty 1

## 2012-11-23 MED ORDER — PREDNISONE 50 MG PO TABS
60.0000 mg | ORAL_TABLET | Freq: Once | ORAL | Status: AC
  Administered 2012-11-23: 60 mg via ORAL
  Filled 2012-11-23: qty 1

## 2012-11-23 MED ORDER — FAMOTIDINE 20 MG PO TABS
20.0000 mg | ORAL_TABLET | Freq: Once | ORAL | Status: AC
  Administered 2012-11-23: 20 mg via ORAL
  Filled 2012-11-23: qty 1

## 2012-11-23 MED ORDER — PREDNISONE 10 MG PO TABS: 20.0000 mg | ORAL_TABLET | Freq: Every day | ORAL | Status: DC

## 2012-11-23 NOTE — ED Notes (Addendum)
Pt states having allergic reaction to Prilosec OTC, Saturday started noticing ears red and itching, last night rash and itching spread to arms and upper torso, states breathing gets worse when she gets itchy. Pt denies difficulty breathing/swallowing, O2 sats range between 88-91% on RA

## 2012-11-23 NOTE — ED Notes (Signed)
Pt reporting improvement in itching.  Denies any other concerns at present time.

## 2012-11-23 NOTE — ED Provider Notes (Signed)
History     CSN: [id number]  Arrival date & time 11/23/12  0436   First MD Initiated Contact with Patient 11/23/12 [phone]      Chief Complaint  Patient presents with  . Allergic Reaction  . Urticaria  . Shortness of Breath    (Consider location/radiation/quality/duration/timing/severity/associated sxs/prior treatment) HPI [name] is a 47 y.o. female who presents to the Emergency Department complaining of awakening with an allergic reaction, hives, red rash, itching. She has been taking the generic Prilosec for a week and feels it is the Prilosec. She had al allergic reaction to Prilosec several years ago when she was  Put on it for GERD. She has had a resurgence of GERD and started the generic brand one week ago.   Past Medical History  Diagnosis Date  . Asthma   . Depression   . Seasonal allergies     Past Surgical History  Procedure Laterality Date  . Abdominal hysterectomy    . Cholecystectomy    . Esophagogastroduodenoscopy    . Colonoscopy  05/21/2011    Procedure: COLONOSCOPY;  Surgeon: Malissa Hippo, MD;  Location: [facility];  Service: Endoscopy;  Laterality: N/A;  12:30    Family History  Problem Relation Age of Onset  . Colon cancer Other     History  Substance Use Topics  . Smoking status: Current Every Day Smoker -- 0.50 packs/day for 23 years    Types: Cigarettes  . Smokeless tobacco: Not on file  . Alcohol Use: No    OB History   Grav Para Term Preterm Abortions TAB SAB Ect Mult Living                  Review of Systems  Constitutional: Negative for fever.       10 Systems reviewed and are negative for acute change except as noted in the HPI.  HENT: Negative for congestion.   Eyes: Negative for discharge and redness.  Respiratory: Negative for cough and shortness of breath.   Cardiovascular: Negative for chest pain.  Gastrointestinal: Negative for vomiting and abdominal pain.  Musculoskeletal: Negative for back pain.  Skin: Positive  for rash.       hives  Neurological: Negative for syncope, numbness and headaches.  Psychiatric/Behavioral:       No behavior change.    Allergies  Codeine and Penicillins  Home Medications   Current Outpatient Rx  Name  Route  Sig  Dispense  Refill  . albuterol (PROVENTIL HFA;VENTOLIN HFA) 108 (90 BASE) MCG/ACT inhaler   Inhalation   Inhale 2 puffs into the lungs every 6 (six) hours as needed.           . beclomethasone (QVAR) 80 MCG/ACT inhaler   Inhalation   Inhale 2 puffs into the lungs daily.           . cetirizine (ZYRTEC) 10 MG tablet   Oral   Take 10 mg by mouth daily.           Marland Kitchen EXPIRED: dicyclomine (BENTYL) 10 MG capsule   Oral   Take 1 capsule (10 mg total) by mouth 2 (two) times daily.   60 capsule   2     Before breakfast and evening meals.   . sertraline (ZOLOFT) 100 MG tablet   Oral   Take 100 mg by mouth daily.             BP 115/72  Pulse 115  Temp(Src) 97.9 F (  36.6 C) (Oral)  Ht 5\' 5"  (1.651 m)  Wt 150 lb (68.04 kg)  BMI 24.96 kg/m2  SpO2 91%  Physical Exam  Nursing note and vitals reviewed. Constitutional: She is oriented to person, place, and time. She appears well-developed and well-nourished.  Awake, alert, nontoxic appearance.  HENT:  Head: Normocephalic and atraumatic.  Right Ear: External ear normal.  Left Ear: External ear normal.  Eyes: EOM are normal. Pupils are equal, round, and reactive to light. Right eye exhibits no discharge. Left eye exhibits no discharge.  Neck: Normal range of motion. Neck supple.  Cardiovascular: Normal rate and intact distal pulses.   Pulmonary/Chest: Effort normal and breath sounds normal. She exhibits no tenderness.  Abdominal: Soft. Bowel sounds are normal. There is no tenderness. There is no rebound.  Musculoskeletal: Normal range of motion. She exhibits no tenderness.  Baseline ROM, no obvious new focal weakness.  Neurological: She is alert and oriented to person, place, and time.   Mental status and motor strength appears baseline for patient and situation.  Skin: No rash noted.  Red rash bodywide and hives  Psychiatric: She has a normal mood and affect.    ED Course  Procedures (including critical care time)    0600 Feeling better. Not as red. Hives have resolved.  MDM  Patient with hives and a red rash with itching from Prilosec. Given prednisone, benadryl, pepcid with improvement. Pt stable in ED with no significant deterioration in condition.The patient appears reasonably screened and/or stabilized for discharge and I doubt any other medical condition or other [facility] requiring further screening, evaluation, or treatment in the ED at this time prior to discharge.  MDM Reviewed: nursing note and vitals           Nicoletta Dress. Colon Branch, MD 11/23/12 [phone]

## 2013-03-15 ENCOUNTER — Emergency Department (HOSPITAL_COMMUNITY)
Admission: EM | Admit: 2013-03-15 | Discharge: 2013-03-15 | Disposition: A | Discharge status: Home / Self Care | Payer: Self-pay | Attending: Emergency Medicine | Admitting: Emergency Medicine

## 2013-03-15 ENCOUNTER — Emergency Department (HOSPITAL_COMMUNITY): Payer: Self-pay

## 2013-03-15 ENCOUNTER — Encounter (HOSPITAL_COMMUNITY): Payer: Self-pay | Admitting: *Deleted

## 2013-03-15 DIAGNOSIS — J45901 Unspecified asthma with (acute) exacerbation: Secondary | ICD-10-CM | POA: Insufficient documentation

## 2013-03-15 DIAGNOSIS — Z8659 Personal history of other mental and behavioral disorders: Secondary | ICD-10-CM | POA: Insufficient documentation

## 2013-03-15 DIAGNOSIS — Z880 Allergy status to penicillin: Secondary | ICD-10-CM | POA: Insufficient documentation

## 2013-03-15 DIAGNOSIS — F17200 Nicotine dependence, unspecified, uncomplicated: Secondary | ICD-10-CM | POA: Insufficient documentation

## 2013-03-15 DIAGNOSIS — R05 Cough: Secondary | ICD-10-CM | POA: Insufficient documentation

## 2013-03-15 DIAGNOSIS — Z79899 Other long term (current) drug therapy: Secondary | ICD-10-CM | POA: Insufficient documentation

## 2013-03-15 DIAGNOSIS — R059 Cough, unspecified: Secondary | ICD-10-CM | POA: Insufficient documentation

## 2013-03-15 DIAGNOSIS — Z8709 Personal history of other diseases of the respiratory system: Secondary | ICD-10-CM | POA: Insufficient documentation

## 2013-03-15 MED ORDER — IPRATROPIUM BROMIDE 0.02 % IN SOLN
0.5000 mg | Freq: Once | RESPIRATORY_TRACT | Status: AC
  Administered 2013-03-15: 0.5 mg via RESPIRATORY_TRACT
  Filled 2013-03-15: qty 2.5

## 2013-03-15 MED ORDER — PREDNISONE 20 MG PO TABS
40.0000 mg | ORAL_TABLET | Freq: Once | ORAL | Status: AC
  Administered 2013-03-15: 40 mg via ORAL
  Filled 2013-03-15: qty 2

## 2013-03-15 MED ORDER — ALBUTEROL SULFATE (5 MG/ML) 0.5% IN NEBU
5.0000 mg | INHALATION_SOLUTION | Freq: Once | RESPIRATORY_TRACT | Status: AC
Start: 2013-03-15 — End: 2013-03-15
  Administered 2013-03-15: 5 mg via RESPIRATORY_TRACT
  Filled 2013-03-15: qty 1

## 2013-03-15 MED ORDER — AZITHROMYCIN 250 MG PO TABS: 250.0000 mg | ORAL_TABLET | Freq: Every day | ORAL | Status: DC

## 2013-03-15 MED ORDER — PREDNISONE 20 MG PO TABS: 40.0000 mg | ORAL_TABLET | Freq: Every day | ORAL | Status: DC

## 2013-03-15 MED ORDER — ALBUTEROL SULFATE HFA 108 (90 BASE) MCG/ACT IN AERS: 1.0000 | INHALATION_SPRAY | Freq: Four times a day (QID) | RESPIRATORY_TRACT | Status: DC | PRN

## 2013-03-15 NOTE — ED Notes (Signed)
Hx of asthma, taking albuterol neb treatments at home.  Feels sore in "rib cage",  Yellow brown sputum.  Fever -101 last night.

## 2013-03-15 NOTE — ED Provider Notes (Signed)
History     This chart was scribed for [employer]. Rubin Payor, MD, MD by Smitty Pluck, ED Scribe. The patient was seen in room [facility]04/[facility]04 and the patient's care was started at 2:18 PM.   CSN: [id number]  Arrival date & time 03/15/13  1253    Chief Complaint  Patient presents with  . Asthma     The history is provided by the patient and medical records. No language interpreter was used.   HPI Comments: [name] is a 47 y.o. female with hx of asthma who presents to the Emergency Department complaining of SOB onset 1 week ago. She reports that she has productive cough with yellow-brownish sputum. She states this feels similar to past asthma flare ups. Pt states that she has used her albuterol neb treatments at home with minor relief. She reports that she smokes cigarettes. Pt denies fever, chills, nausea, vomiting, diarrhea, weakness and any other pain.   Past Medical History  Diagnosis Date  . Asthma   . Depression   . Seasonal allergies     Past Surgical History  Procedure Laterality Date  . Abdominal hysterectomy    . Cholecystectomy    . Esophagogastroduodenoscopy    . Colonoscopy  05/21/2011    Procedure: COLONOSCOPY;  Surgeon: Malissa Hippo, MD;  Location: [facility];  Service: Endoscopy;  Laterality: N/A;  12:30    Family History  Problem Relation Age of Onset  . Colon cancer Other     History  Substance Use Topics  . Smoking status: Current Every Day Smoker -- 0.50 packs/day for 23 years    Types: Cigarettes  . Smokeless tobacco: Not on file  . Alcohol Use: No    OB History   Grav Para Term Preterm Abortions TAB SAB Ect Mult Living                  Review of Systems  Respiratory: Positive for cough and shortness of breath.   Cardiovascular: Negative for chest pain.  Gastrointestinal: Negative for nausea, vomiting and diarrhea.  All other systems reviewed and are negative.    Allergies  Codeine; Penicillins; and Prilosec  Home Medications    Current Outpatient Rx  Name  Route  Sig  Dispense  Refill  . acetaminophen (TYLENOL) 500 MG tablet   Oral   Take 1,000 mg by mouth every 6 (six) hours as needed for pain.         Marland Kitchen albuterol (PROVENTIL HFA;VENTOLIN HFA) 108 (90 BASE) MCG/ACT inhaler   Inhalation   Inhale 2 puffs into the lungs every 6 (six) hours as needed for shortness of breath.          Marland Kitchen albuterol (PROVENTIL HFA;VENTOLIN HFA) 108 (90 BASE) MCG/ACT inhaler   Inhalation   Inhale 1-2 puffs into the lungs every 6 (six) hours as needed for wheezing.   1 Inhaler   0   . azithromycin (ZITHROMAX) 250 MG tablet   Oral   Take 1 tablet (250 mg total) by mouth daily. Take first 2 tablets together, then 1 every day until finished.   6 tablet   0   . predniSONE (DELTASONE) 20 MG tablet   Oral   Take 2 tablets (40 mg total) by mouth daily.   8 tablet   0     BP 142/90  Pulse 92  Temp(Src) 98.2 F (36.8 C) (Oral)  Resp 20  Ht 5\' 5"  (1.651 m)  Wt 141 lb (63.957 kg)  BMI 23.46 kg/m2  SpO2 96%  Physical Exam  Nursing note and vitals reviewed. Constitutional: She is oriented to person, place, and time. She appears well-developed and well-nourished. No distress.  HENT:  Head: Normocephalic and atraumatic.  Eyes: EOM are normal. Pupils are equal, round, and reactive to light.  Neck: Normal range of motion. Neck supple. No tracheal deviation present.  Cardiovascular: Normal rate.   Pulmonary/Chest: No respiratory distress. She has wheezes (diffuse ).  No retractions No stridor Harsh breath sounds Prolonged breath sounds    Abdominal: Soft. She exhibits no distension.  Musculoskeletal: Normal range of motion.  Neurological: She is alert and oriented to person, place, and time.  Skin: Skin is warm and dry.  Psychiatric: She has a normal mood and affect. Her behavior is normal.    ED Course  Procedures (including critical care time)  COORDINATION OF CARE: 2:20 PM Discussed ED treatment with pt and  pt agrees.  Medications  albuterol (PROVENTIL) (5 MG/ML) 0.5% nebulizer solution 5 mg (5 mg Nebulization Given 03/15/13 1439)  ipratropium (ATROVENT) nebulizer solution 0.5 mg (0.5 mg Nebulization Given 03/15/13 1439)  predniSONE (DELTASONE) tablet 40 mg (40 mg Oral Given 03/15/13 1441)      Labs Reviewed - No data to display Dg Chest 2 View  03/15/2013   *RADIOLOGY REPORT*  Clinical Data: Cough, congestion and difficulty breathing.  CHEST - 2 VIEW  Comparison: 06/04/2006.  Findings: Trachea may be slightly deviated to the left, which can be seen with right thyroid enlargement.  Heart size normal.  Lungs are mildly hyperinflated but clear.  No pleural fluid.  IMPRESSION: No acute findings.   Original Report Authenticated By: Leanna Battles, M.D.     1. Asthma attack       MDM  Patient with shortness of breath. Diffuse wheezing. Sputum has changed from yellow to brown. We'll treat with steroids and antibiotics. Patient is not in respiratory distress.    I personally performed the services described in this documentation, which was scribed in my presence. The recorded information has been reviewed and is accurate.     Juliet Rude. Rubin Payor, MD 03/15/13 1526

## 2014-10-04 ENCOUNTER — Other Ambulatory Visit (HOSPITAL_COMMUNITY): Payer: Self-pay | Admitting: *Deleted

## 2014-10-04 DIAGNOSIS — N6320 Unspecified lump in the left breast, unspecified quadrant: Secondary | ICD-10-CM

## 2014-10-11 ENCOUNTER — Encounter (HOSPITAL_COMMUNITY): Payer: Medicaid Other

## 2014-10-18 ENCOUNTER — Other Ambulatory Visit (HOSPITAL_COMMUNITY): Payer: Self-pay | Admitting: *Deleted

## 2014-10-18 ENCOUNTER — Ambulatory Visit (HOSPITAL_COMMUNITY)
Admission: RE | Admit: 2014-10-18 | Discharge: 2014-10-18 | Disposition: A | Discharge status: Home / Self Care | Payer: PRIVATE HEALTH INSURANCE | Source: Ambulatory Visit | Attending: *Deleted | Admitting: *Deleted

## 2014-10-18 DIAGNOSIS — N6320 Unspecified lump in the left breast, unspecified quadrant: Secondary | ICD-10-CM

## 2014-10-18 DIAGNOSIS — N6002 Solitary cyst of left breast: Secondary | ICD-10-CM | POA: Insufficient documentation

## 2014-10-18 DIAGNOSIS — N63 Unspecified lump in breast: Secondary | ICD-10-CM | POA: Diagnosis present

## 2016-06-06 ENCOUNTER — Emergency Department (HOSPITAL_COMMUNITY): Payer: Medicaid Other

## 2016-06-06 ENCOUNTER — Encounter (HOSPITAL_COMMUNITY): Payer: Self-pay | Admitting: *Deleted

## 2016-06-06 ENCOUNTER — Encounter (INDEPENDENT_AMBULATORY_CARE_PROVIDER_SITE_OTHER): Payer: Self-pay | Admitting: *Deleted

## 2016-06-06 ENCOUNTER — Emergency Department (HOSPITAL_COMMUNITY)
Admission: EM | Admit: 2016-06-06 | Discharge: 2016-06-06 | Disposition: A | Discharge status: Home / Self Care | Payer: Medicaid Other | Attending: Emergency Medicine | Admitting: Emergency Medicine

## 2016-06-06 DIAGNOSIS — J40 Bronchitis, not specified as acute or chronic: Secondary | ICD-10-CM

## 2016-06-06 DIAGNOSIS — F17210 Nicotine dependence, cigarettes, uncomplicated: Secondary | ICD-10-CM | POA: Insufficient documentation

## 2016-06-06 DIAGNOSIS — J440 Chronic obstructive pulmonary disease with acute lower respiratory infection: Secondary | ICD-10-CM | POA: Insufficient documentation

## 2016-06-06 HISTORY — DX: Chronic obstructive pulmonary disease, unspecified: J44.9

## 2016-06-06 MED ORDER — PREDNISONE 50 MG PO TABS
60.0000 mg | ORAL_TABLET | Freq: Once | ORAL | Status: AC
  Administered 2016-06-06: 60 mg via ORAL
  Filled 2016-06-06: qty 1

## 2016-06-06 MED ORDER — ALBUTEROL SULFATE HFA 108 (90 BASE) MCG/ACT IN AERS: 1.0000 | INHALATION_SPRAY | Freq: Four times a day (QID) | RESPIRATORY_TRACT | 0 refills | Status: DC | PRN

## 2016-06-06 MED ORDER — IPRATROPIUM-ALBUTEROL 0.5-2.5 (3) MG/3ML IN SOLN
3.0000 mL | Freq: Once | RESPIRATORY_TRACT | Status: AC
  Administered 2016-06-06: 3 mL via RESPIRATORY_TRACT
  Filled 2016-06-06: qty 3

## 2016-06-06 MED ORDER — PREDNISONE 20 MG PO TABS: 40.0000 mg | ORAL_TABLET | Freq: Every day | ORAL | 0 refills | Status: DC

## 2016-06-06 NOTE — ED Notes (Addendum)
Pt has had a fever of 102 yesterday w/ body aches, cough, and congestion. Pt threw up last night. 06-06-16 and is complaining of N/V.  Pt also says her ears ache. Pt took Tylenol to bring the fever down.

## 2016-06-06 NOTE — ED Notes (Signed)
RT called for breathing tx. 

## 2016-06-06 NOTE — ED Triage Notes (Signed)
Fever, body aches , aching all over, flu symptoms onset 2 days ago

## 2016-06-09 NOTE — ED Provider Notes (Signed)
[facility] DEPT Provider Note   CSN: [id number] Arrival date & time: 06/06/16  1448     History   Chief Complaint Chief Complaint  Patient presents with  . Fever  . Cough    HPI [name] is a 50 y.o. female.  HPI   50 year old female with fever and cough. Onset about 2 days ago. Persistent since then. She has felt feverish. Generalized weakness and body aches. Cough is nonproductive. Facial congestion. Vomiting once last night. No urinary symptoms. No sick contacts.  Past Medical History:  Diagnosis Date  . Asthma   . COPD (chronic obstructive pulmonary disease) ([place])   . Depression   . Seasonal allergies     There are no active problems to display for this patient.   Past Surgical History:  Procedure Laterality Date  . ABDOMINAL HYSTERECTOMY    . CHOLECYSTECTOMY    . COLONOSCOPY  05/21/2011   Procedure: COLONOSCOPY;  Surgeon: Rogene Houston, MD;  Location: [facility];  Service: Endoscopy;  Laterality: N/A;  12:30  . ESOPHAGOGASTRODUODENOSCOPY    . TUBAL LIGATION      OB History    No data available       Home Medications    Prior to Admission medications   Medication Sig Start Date End Date Taking? Authorizing Provider  acetaminophen (TYLENOL) 500 MG tablet Take 1,000 mg by mouth every 6 (six) hours as needed for pain.    Historical Provider, MD  albuterol (PROVENTIL HFA;VENTOLIN HFA) 108 (90 BASE) MCG/ACT inhaler Inhale 2 puffs into the lungs every 6 (six) hours as needed for shortness of breath.     Historical Provider, MD  albuterol (PROVENTIL HFA;VENTOLIN HFA) 108 (90 BASE) MCG/ACT inhaler Inhale 1-2 puffs into the lungs every 6 (six) hours as needed for wheezing. 03/15/13   Davonna Belling, MD  albuterol (PROVENTIL HFA;VENTOLIN HFA) 108 (90 Base) MCG/ACT inhaler Inhale 1-2 puffs into the lungs every 6 (six) hours as needed for wheezing or shortness of breath. 06/06/16   Virgel Manifold, MD  azithromycin (ZITHROMAX) 250 MG tablet Take 1 tablet  (250 mg total) by mouth daily. Take first 2 tablets together, then 1 every day until finished. 03/15/13   Davonna Belling, MD  predniSONE (DELTASONE) 20 MG tablet Take 2 tablets (40 mg total) by mouth daily. 06/06/16   Virgel Manifold, MD    Family History Family History  Problem Relation Age of Onset  . Colon cancer Other     Social History Social History  Substance Use Topics  . Smoking status: Current Every Day Smoker    Packs/day: 0.50    Years: 23.00    Types: Cigarettes  . Smokeless tobacco: Never Used  . Alcohol use No     Allergies   Codeine; Penicillins; and Prilosec [omeprazole]   Review of Systems Review of Systems  All systems reviewed and negative, other than as noted in HPI.  Physical Exam Updated Vital Signs BP 122/76   Pulse 87   Temp 98.2 F (36.8 C) (Oral)   Resp 18   Ht 5\' 2"  (1.575 m)   Wt 142 lb 11.2 oz (64.7 kg)   SpO2 100%   BMI 26.10 kg/m   Physical Exam  Constitutional: She appears well-developed and well-nourished. No distress.  HENT:  Head: Normocephalic and atraumatic.  Eyes: Conjunctivae are normal. Right eye exhibits no discharge. Left eye exhibits no discharge.  Neck: Neck supple.  Cardiovascular: Normal rate, regular rhythm and normal heart sounds.  Exam  reveals no gallop and no friction rub.   No murmur heard. Pulmonary/Chest: Effort normal. No respiratory distress. She has wheezes.  Abdominal: Soft. She exhibits no distension. There is no tenderness.  Musculoskeletal: She exhibits no edema or tenderness.  Neurological: She is alert.  Skin: Skin is warm and dry.  Psychiatric: She has a normal mood and affect. Her behavior is normal. Thought content normal.  Nursing note and vitals reviewed.    ED Treatments / Results  Labs (all labs ordered are listed, but only abnormal results are displayed) Labs Reviewed - No data to display  EKG  EKG Interpretation None       Radiology No results  found.  Procedures Procedures (including critical care time)  Medications Ordered in ED Medications  ipratropium-albuterol (DUONEB) 0.5-2.5 (3) MG/3ML nebulizer solution 3 mL (3 mLs Nebulization Given 06/06/16 1739)  predniSONE (DELTASONE) tablet 60 mg (60 mg Oral Given 06/06/16 1722)     Initial Impression / Assessment and Plan / ED Course  I have reviewed the triage vital signs and the nursing notes.  Pertinent labs & imaging results that were available during my care of the patient were reviewed by me and considered in my medical decision making (see chart for details).  Clinical Course    50 year old female with likely viral illness. Some wheezing on exam. Steroids and albuterol. Chest x-ray without focal infiltrate. Return precautions discussed.  Final Clinical Impressions(s) / ED Diagnoses   Final diagnoses:  Bronchitis    New Prescriptions Discharge Medication List as of 06/06/2016  5:49 PM    START taking these medications   Details  !! albuterol (PROVENTIL HFA;VENTOLIN HFA) 108 (90 Base) MCG/ACT inhaler Inhale 1-2 puffs into the lungs every 6 (six) hours as needed for wheezing or shortness of breath., Starting Thu 06/06/2016, Print     !! - Potential duplicate medications found. Please discuss with provider.       Virgel Manifold, MD 06/09/16 [phone]

## 2016-12-04 DIAGNOSIS — S838X1D Sprain of other specified parts of right knee, subsequent encounter: Secondary | ICD-10-CM | POA: Diagnosis not present

## 2016-12-12 DIAGNOSIS — M238X1 Other internal derangements of right knee: Secondary | ICD-10-CM | POA: Diagnosis not present

## 2016-12-12 DIAGNOSIS — M25561 Pain in right knee: Secondary | ICD-10-CM | POA: Diagnosis not present

## 2016-12-13 ENCOUNTER — Other Ambulatory Visit: Payer: Self-pay | Admitting: Physician Assistant

## 2016-12-13 DIAGNOSIS — M25561 Pain in right knee: Secondary | ICD-10-CM

## 2016-12-16 ENCOUNTER — Ambulatory Visit
Admission: RE | Admit: 2016-12-16 | Discharge: 2016-12-16 | Disposition: A | Discharge status: Home / Self Care | Payer: BLUE CROSS/BLUE SHIELD | Source: Ambulatory Visit | Attending: Physician Assistant | Admitting: Physician Assistant

## 2016-12-16 DIAGNOSIS — M25561 Pain in right knee: Secondary | ICD-10-CM

## 2016-12-25 DIAGNOSIS — M25561 Pain in right knee: Secondary | ICD-10-CM | POA: Diagnosis not present

## 2016-12-25 DIAGNOSIS — S838X1D Sprain of other specified parts of right knee, subsequent encounter: Secondary | ICD-10-CM | POA: Diagnosis not present

## 2017-01-22 DIAGNOSIS — M25561 Pain in right knee: Secondary | ICD-10-CM | POA: Diagnosis not present

## 2017-01-22 DIAGNOSIS — S8391XD Sprain of unspecified site of right knee, subsequent encounter: Secondary | ICD-10-CM | POA: Diagnosis not present

## 2017-04-24 DIAGNOSIS — J441 Chronic obstructive pulmonary disease with (acute) exacerbation: Secondary | ICD-10-CM | POA: Diagnosis not present

## 2017-04-24 DIAGNOSIS — J329 Chronic sinusitis, unspecified: Secondary | ICD-10-CM | POA: Diagnosis not present

## 2017-04-24 DIAGNOSIS — H6093 Unspecified otitis externa, bilateral: Secondary | ICD-10-CM | POA: Diagnosis not present

## 2017-05-05 DIAGNOSIS — H60502 Unspecified acute noninfective otitis externa, left ear: Secondary | ICD-10-CM | POA: Diagnosis not present

## 2017-05-05 DIAGNOSIS — J329 Chronic sinusitis, unspecified: Secondary | ICD-10-CM | POA: Diagnosis not present

## 2017-05-05 DIAGNOSIS — J441 Chronic obstructive pulmonary disease with (acute) exacerbation: Secondary | ICD-10-CM | POA: Diagnosis not present

## 2017-06-11 DIAGNOSIS — H5712 Ocular pain, left eye: Secondary | ICD-10-CM | POA: Diagnosis not present

## 2017-06-12 DIAGNOSIS — H2000 Unspecified acute and subacute iridocyclitis: Secondary | ICD-10-CM | POA: Diagnosis not present

## 2017-06-12 DIAGNOSIS — H5712 Ocular pain, left eye: Secondary | ICD-10-CM | POA: Diagnosis not present

## 2017-06-12 DIAGNOSIS — H16102 Unspecified superficial keratitis, left eye: Secondary | ICD-10-CM | POA: Diagnosis not present

## 2017-07-03 DIAGNOSIS — J329 Chronic sinusitis, unspecified: Secondary | ICD-10-CM | POA: Diagnosis not present

## 2017-07-03 DIAGNOSIS — J069 Acute upper respiratory infection, unspecified: Secondary | ICD-10-CM | POA: Diagnosis not present

## 2017-07-03 DIAGNOSIS — J209 Acute bronchitis, unspecified: Secondary | ICD-10-CM | POA: Diagnosis not present

## 2017-09-26 DIAGNOSIS — J329 Chronic sinusitis, unspecified: Secondary | ICD-10-CM | POA: Diagnosis not present

## 2017-09-26 DIAGNOSIS — J209 Acute bronchitis, unspecified: Secondary | ICD-10-CM | POA: Diagnosis not present

## 2017-09-26 DIAGNOSIS — J069 Acute upper respiratory infection, unspecified: Secondary | ICD-10-CM | POA: Diagnosis not present

## 2017-12-22 DIAGNOSIS — J209 Acute bronchitis, unspecified: Secondary | ICD-10-CM | POA: Diagnosis not present

## 2017-12-22 DIAGNOSIS — J329 Chronic sinusitis, unspecified: Secondary | ICD-10-CM | POA: Diagnosis not present

## 2017-12-22 DIAGNOSIS — H6091 Unspecified otitis externa, right ear: Secondary | ICD-10-CM | POA: Diagnosis not present

## 2017-12-22 DIAGNOSIS — J111 Influenza due to unidentified influenza virus with other respiratory manifestations: Secondary | ICD-10-CM | POA: Diagnosis not present

## 2018-01-07 DIAGNOSIS — J45901 Unspecified asthma with (acute) exacerbation: Secondary | ICD-10-CM | POA: Diagnosis not present

## 2018-01-07 DIAGNOSIS — F17200 Nicotine dependence, unspecified, uncomplicated: Secondary | ICD-10-CM | POA: Diagnosis not present

## 2018-01-07 DIAGNOSIS — F319 Bipolar disorder, unspecified: Secondary | ICD-10-CM | POA: Diagnosis not present

## 2018-01-07 DIAGNOSIS — Z6824 Body mass index (BMI) 24.0-24.9, adult: Secondary | ICD-10-CM | POA: Diagnosis not present

## 2018-01-21 DIAGNOSIS — Z0000 Encounter for general adult medical examination without abnormal findings: Secondary | ICD-10-CM | POA: Diagnosis not present

## 2018-01-23 DIAGNOSIS — J45901 Unspecified asthma with (acute) exacerbation: Secondary | ICD-10-CM | POA: Diagnosis not present

## 2018-01-23 DIAGNOSIS — H6503 Acute serous otitis media, bilateral: Secondary | ICD-10-CM | POA: Diagnosis not present

## 2018-01-23 DIAGNOSIS — Z0000 Encounter for general adult medical examination without abnormal findings: Secondary | ICD-10-CM | POA: Diagnosis not present

## 2018-01-23 DIAGNOSIS — F17200 Nicotine dependence, unspecified, uncomplicated: Secondary | ICD-10-CM | POA: Diagnosis not present

## 2018-02-06 DIAGNOSIS — Z6824 Body mass index (BMI) 24.0-24.9, adult: Secondary | ICD-10-CM | POA: Diagnosis not present

## 2018-02-06 DIAGNOSIS — R062 Wheezing: Secondary | ICD-10-CM | POA: Diagnosis not present

## 2018-08-05 DIAGNOSIS — F17200 Nicotine dependence, unspecified, uncomplicated: Secondary | ICD-10-CM | POA: Diagnosis not present

## 2018-08-05 DIAGNOSIS — Z23 Encounter for immunization: Secondary | ICD-10-CM | POA: Diagnosis not present

## 2018-08-05 DIAGNOSIS — M79642 Pain in left hand: Secondary | ICD-10-CM | POA: Diagnosis not present

## 2018-08-14 DIAGNOSIS — R2232 Localized swelling, mass and lump, left upper limb: Secondary | ICD-10-CM | POA: Diagnosis not present

## 2018-08-14 DIAGNOSIS — G5602 Carpal tunnel syndrome, left upper limb: Secondary | ICD-10-CM | POA: Diagnosis not present

## 2018-09-07 DIAGNOSIS — M65332 Trigger finger, left middle finger: Secondary | ICD-10-CM | POA: Diagnosis not present

## 2018-09-07 DIAGNOSIS — M71342 Other bursal cyst, left hand: Secondary | ICD-10-CM | POA: Diagnosis not present

## 2018-09-07 DIAGNOSIS — R2232 Localized swelling, mass and lump, left upper limb: Secondary | ICD-10-CM | POA: Diagnosis not present

## 2018-12-14 DIAGNOSIS — J069 Acute upper respiratory infection, unspecified: Secondary | ICD-10-CM | POA: Diagnosis not present

## 2018-12-14 DIAGNOSIS — R062 Wheezing: Secondary | ICD-10-CM | POA: Diagnosis not present

## 2018-12-17 DIAGNOSIS — R062 Wheezing: Secondary | ICD-10-CM | POA: Diagnosis not present

## 2018-12-17 DIAGNOSIS — R509 Fever, unspecified: Secondary | ICD-10-CM | POA: Diagnosis not present

## 2018-12-17 DIAGNOSIS — J069 Acute upper respiratory infection, unspecified: Secondary | ICD-10-CM | POA: Diagnosis not present

## 2019-03-16 DIAGNOSIS — F4310 Post-traumatic stress disorder, unspecified: Secondary | ICD-10-CM | POA: Diagnosis not present

## 2019-05-12 DIAGNOSIS — R110 Nausea: Secondary | ICD-10-CM | POA: Diagnosis not present

## 2019-05-12 DIAGNOSIS — Z136 Encounter for screening for cardiovascular disorders: Secondary | ICD-10-CM | POA: Diagnosis not present

## 2019-05-12 DIAGNOSIS — J019 Acute sinusitis, unspecified: Secondary | ICD-10-CM | POA: Diagnosis not present

## 2019-05-12 DIAGNOSIS — R062 Wheezing: Secondary | ICD-10-CM | POA: Diagnosis not present

## 2019-05-12 DIAGNOSIS — R002 Palpitations: Secondary | ICD-10-CM | POA: Diagnosis not present

## 2019-05-12 DIAGNOSIS — R42 Dizziness and giddiness: Secondary | ICD-10-CM | POA: Diagnosis not present

## 2019-05-17 DIAGNOSIS — I10 Essential (primary) hypertension: Secondary | ICD-10-CM | POA: Diagnosis not present

## 2019-05-17 DIAGNOSIS — R42 Dizziness and giddiness: Secondary | ICD-10-CM | POA: Diagnosis not present

## 2019-05-21 DIAGNOSIS — R42 Dizziness and giddiness: Secondary | ICD-10-CM | POA: Diagnosis not present

## 2019-05-21 DIAGNOSIS — I10 Essential (primary) hypertension: Secondary | ICD-10-CM | POA: Diagnosis not present

## 2019-05-24 ENCOUNTER — Other Ambulatory Visit (HOSPITAL_COMMUNITY): Payer: Self-pay | Admitting: Adult Health Nurse Practitioner

## 2019-05-24 DIAGNOSIS — R42 Dizziness and giddiness: Secondary | ICD-10-CM

## 2019-05-24 DIAGNOSIS — I10 Essential (primary) hypertension: Secondary | ICD-10-CM

## 2019-05-25 ENCOUNTER — Encounter: Payer: Self-pay | Admitting: Cardiology

## 2019-05-25 ENCOUNTER — Ambulatory Visit (INDEPENDENT_AMBULATORY_CARE_PROVIDER_SITE_OTHER): Payer: BC Managed Care – PPO | Admitting: Cardiology

## 2019-05-25 ENCOUNTER — Other Ambulatory Visit: Payer: Self-pay

## 2019-05-25 VITALS — BP 118/80 | HR 66 | Ht 62.0 in

## 2019-05-25 DIAGNOSIS — Z01812 Encounter for preprocedural laboratory examination: Secondary | ICD-10-CM | POA: Diagnosis not present

## 2019-05-25 DIAGNOSIS — R072 Precordial pain: Secondary | ICD-10-CM

## 2019-05-25 DIAGNOSIS — Z716 Tobacco abuse counseling: Secondary | ICD-10-CM | POA: Diagnosis not present

## 2019-05-25 DIAGNOSIS — Z7189 Other specified counseling: Secondary | ICD-10-CM

## 2019-05-25 DIAGNOSIS — I10 Essential (primary) hypertension: Secondary | ICD-10-CM | POA: Insufficient documentation

## 2019-05-25 DIAGNOSIS — R002 Palpitations: Secondary | ICD-10-CM | POA: Diagnosis not present

## 2019-05-25 MED ORDER — METOPROLOL TARTRATE 50 MG PO TABS: ORAL_TABLET | ORAL | 0 refills | Status: DC

## 2019-05-25 NOTE — Progress Notes (Signed)
Cardiology Office Note:    Date:  05/25/2019   ID:  [name], DOB 1965/10/15, MRN [id number]  PCP:  Angela Squibb, MD  Cardiologist:  Angela Dresser, MD PhD  Referring MD: Angela Squibb, MD   CC: new consult for chest/arm pain, palpitations  History of Present Illness:    [name] is a 53 y.o. female with a hx of tobacco use who is seen as a new consult at the request of Angela Squibb, MD for the evaluation and management of dizziness, palpitations. During evaluation also report left arm/chest tightness.  Tachycardia/palpitations: -Initial onset: about 5 weeks ago -Frequency/Duration: more at night, has gotten more frequent in last few weeks. -Associated symptoms: has noted that BP has also intermittently been high, up to [id number] systolic. Has no history of BP problems. With elevated BP has noted rare left arm/left chest discomfort. Started on losartan 25 mg on 8/28, has been checking BP at home. Has been 0000000 systolic since then. Dizziness has also improved. Also has been having really bad headaches. -Aggravating/alleviating factors: better with lying down, worse with activity -Syncope/near syncope: no syncope but feels weak sometimes when standing up/being active. -Prior cardiac history: none -Prior ECG: NSR -Prior workup: none -Prior treatment: started bystolic about a week and losartan since 8/29, feels  -Possible medication interactions: none -Caffeine: 1 c coffee in AM. Avoids caffeine the rest of the day -Alcohol: occasional social drinking -Tobacco: less than 1 ppd -OTC supplements: none -Comorbidities: none prior to recent episodes -Exercise level: previously very active, worked in the garden center at [employer],  -Labs: TSH, kidney function/electrolytes, CBC reviewed. -Cardiac ROS: no PND, no orthopnea, no LE edema. -Family history: mother had a stroke at age 78, 60 had CHF, [name] had heart and blood pressure problems. Everyone else has cancer.   Past  Medical History:  Diagnosis Date  . Asthma   . COPD (chronic obstructive pulmonary disease) ([place])   . Depression   . Seasonal allergies     Past Surgical History:  Procedure Laterality Date  . ABDOMINAL HYSTERECTOMY    . CHOLECYSTECTOMY    . COLONOSCOPY  05/21/2011   Procedure: COLONOSCOPY;  Surgeon: Angela Houston, MD;  Location: [facility];  Service: Endoscopy;  Laterality: N/A;  12:30  . ESOPHAGOGASTRODUODENOSCOPY    . TUBAL LIGATION      Current Medications: Current Outpatient Medications on File Prior to Visit  Medication Sig  . acetaminophen (TYLENOL) 500 MG tablet Take 1,000 mg by mouth every 6 (six) hours as needed for pain.  Marland Kitchen albuterol (PROVENTIL HFA;VENTOLIN HFA) 108 (90 BASE) MCG/ACT inhaler Inhale 2 puffs into the lungs every 6 (six) hours as needed for shortness of breath.   . budesonide-formoterol (SYMBICORT) 160-4.5 MCG/ACT inhaler Inhale 2 puffs into the lungs daily.  . fluticasone (FLONASE) 50 MCG/ACT nasal spray Place 2 sprays into both nostrils daily.  Marland Kitchen losartan (COZAAR) 25 MG tablet Take 25 mg by mouth daily.  . nebivolol (BYSTOLIC) 5 MG tablet Take 5 mg by mouth daily.  . ondansetron (ZOFRAN) 8 MG tablet Take 8 mg by mouth as needed.   No current facility-administered medications on file prior to visit.      Allergies:   Codeine, Penicillins, and Prilosec [omeprazole]   Social History   Socioeconomic History  . Marital status: Married    Spouse name: Not on file  . Number of children: Not on file  . Years of education: Not on file  .  Highest education level: Not on file  Occupational History  . Not on file  Social Needs  . Financial resource strain: Not on file  . Food insecurity    Worry: Not on file    Inability: Not on file  . Transportation needs    Medical: Not on file    Non-medical: Not on file  Tobacco Use  . Smoking status: Current Every Day Smoker    Packs/day: 0.50    Years: 23.00    Pack years: 11.50    Types: Cigarettes   . Smokeless tobacco: Never Used  Substance and Sexual Activity  . Alcohol use: No  . Drug use: No  . Sexual activity: Yes    Birth control/protection: Surgical  Lifestyle  . Physical activity    Days per week: Not on file    Minutes per session: Not on file  . Stress: Not on file  Relationships  . Social Herbalist on phone: Not on file    Gets together: Not on file    Attends religious service: Not on file    Active member of club or organization: Not on file    Attends meetings of clubs or organizations: Not on file    Relationship status: Not on file  Other Topics Concern  . Not on file  Social History Narrative  . Not on file     Family History: The patient's family history includes Colon cancer in an other family member. mother had a stroke at age 54, 68 had CHF, [name] had heart and blood pressure problems. Everyone else has cancer.  ROS:   Please see the history of present illness.  Additional pertinent ROS: Constitutional: Negative for chills, fever, night sweats, unintentional weight loss  HENT: Negative for ear pain and hearing loss.   Eyes: Negative for loss of vision and eye pain.  Respiratory: Negative for cough, sputum, wheezing.   Cardiovascular: See HPI. Gastrointestinal: Negative for abdominal pain, melena, and hematochezia.  Genitourinary: Negative for dysuria and hematuria.  Musculoskeletal: Negative for falls and myalgias.  Skin: Negative for itching and rash.  Neurological: Negative for focal weakness, focal sensory changes and loss of consciousness.  Endo/Heme/Allergies: Does not bruise/bleed easily.     EKGs/Labs/Other Studies Reviewed:    The following studies were reviewed today: Notes from Dr. Juel Paul office  EKG:  EKG is personally reviewed.  The ekg ordered today demonstrates NSR at 66 bpm  Recent Labs: No results found for requested labs within last 8760 hours.  Recent Lipid Panel No results found for: CHOL, TRIG, HDL,  CHOLHDL, VLDL, LDLCALC, LDLDIRECT  Physical Exam:    VS:  BP 118/80   Pulse 66   Ht 5\' 2"  (1.575 m)   SpO2 97%   BMI 26.10 kg/m     Wt Readings from Last 3 Encounters:  06/06/16 142 lb 11.2 oz (64.7 kg)  03/15/13 141 lb (64 kg)  11/23/12 150 lb (68 kg)     GEN: Well nourished, well developed in no acute distress HEENT: Normal, moist mucous membranes NECK: No JVD CARDIAC: regular rhythm, normal S1 and S2, no murmurs, rubs, gallops.  VASCULAR: Radial and DP pulses 2+ bilaterally. No carotid bruits RESPIRATORY:  Clear to auscultation without rales. Did have audible expiratory wheeze. ABDOMEN: Soft, non-tender, non-distended MUSCULOSKELETAL:  Ambulates independently SKIN: Warm and dry, no edema NEUROLOGIC:  Alert and oriented x 3. No focal neuro deficits noted. PSYCHIATRIC:  Normal affect    ASSESSMENT:  1. Palpitation   2. Precordial pain   3. Pre-procedure lab exam   4. Essential hypertension   5. Tobacco abuse counseling   6. Cardiac risk counseling   7. Counseling on health promotion and disease prevention    PLAN:    Palpitations: severe, somewhat improved on bystolic but still occurring. Not like any panic attack she has had before. Very concerning to her -14 day Zio monitor for rhythm evaluation, instructed on use  Left chest/precordial pain and left arm pain: at the time of high blood pressure and palpitations. May be anginal equivalent. Significant smoking history and family history as risk factors -discussed treadmill stress, nuclear stress/lexiscan, and CT coronary angiography. Discussed pros and cons of each, including but not limited to false positive/false negative risk, radiation risk, and risk of IV contrast dye. Based on shared decision making, decision was made to pursue CT coronary angiography. -will give one time dose of metoprolol in addition to home beta blocker -counseled on need to get BMET one week prior to test -counseled on use of sublingual  nitroglycerin and its importance to a good test  Hypertension: new diagnosis recently for her -started on bystolic and losartan by PCP -well controlled today, goal <130/80  Tobacco use: The patient was counseled on tobacco cessation today for 4 minutes.  Counseling included reviewing the risks of smoking tobacco products, how it impacts the patient's current medical diagnoses and different strategies for quitting.  Pharmacotherapy to aid in tobacco cessation was not prescribed today.  Cardiac risk counseling and prevention recommendations: -recommend heart healthy/Mediterranean diet, with whole grains, fruits, vegetable, fish, lean meats, nuts, and olive oil. Limit salt. -recommend moderate walking, 3-5 times/week for 30-50 minutes each session. Aim for at least 150 minutes.week. Goal should be pace of 3 miles/hours, or walking 1.5 miles in 30 minutes -recommend avoidance of tobacco products. Avoid excess alcohol. -Additional risk factor control:  -Diabetes: A1c is not available, no history  -Lipids: done at PCP, I cannot see full list but Tchol 170, HDL 57, TG 83  -Blood pressure control: as above  Plan for follow up: 3 mos or sooner TBD based on results of testing  Medication Adjustments/Labs and Tests Ordered: Current medicines are reviewed at length with the patient today.  Concerns regarding medicines are outlined above.  Orders Placed This Encounter  Procedures  . CT CORONARY MORPH W/CTA COR W/SCORE W/CA W/CM &/OR WO/CM  . CT CORONARY FRACTIONAL FLOW RESERVE DATA PREP  . CT CORONARY FRACTIONAL FLOW RESERVE FLUID ANALYSIS  . Basic metabolic panel  . LONG TERM MONITOR (3-14 DAYS)  . EKG 12-Lead   Meds ordered this encounter  Medications  . metoprolol tartrate (LOPRESSOR) 50 MG tablet    Sig: TAKE 1 TABLET 2 HR PRIOR TO CARDIAC PROCEDURE    Dispense:  1 tablet    Refill:  0    Patient Instructions  Medication Instructions:  Your Physician recommend you continue on your  current medication as directed.    If you need a refill on your cardiac medications before your next appointment, please call your pharmacy.   Lab work: Your physician recommends that you return for lab work 1 week prior to procedure  If you have labs (blood work) drawn today and your tests are completely normal, you will receive your results only by: Marland Kitchen MyChart Message (if you have MyChart) OR . A paper copy in the mail If you have any lab test that is abnormal or we need to change  your treatment, we will call you to review the results.  Testing/Procedures: Your physician has requested that you have cardiac CT. Cardiac computed tomography (CT) is a painless test that uses an x-ray machine to take clear, detailed pictures of your heart. For further information please visit HugeFiesta.tn. Please follow instruction sheet as given.  Our physician has recommended that you wear an 14  DAY ZIO-PATCH monitor. The Zio patch cardiac monitor continuously records heart rhythm data for up to 14 days, this is for patients being evaluated for multiple types heart rhythms. For the first 24 hours post application, please avoid getting the Zio monitor wet in the shower or by excessive sweating during exercise. After that, feel free to carry on with regular activities. Keep soaps and lotions away from the ZIO XT Patch.   This will be placed at our [facility] location - [address].        Follow-Up: At [facility], you and your health needs are our priority.  As part of our continuing mission to provide you with exceptional heart care, we have created designated Provider Care Teams.  These Care Teams include your primary Cardiologist (physician) and Advanced Practice Providers (APPs -  Physician Assistants and Nurse Practitioners) who all work together to provide you with the care you need, when you need it. You will need a follow up appointment in 3 months.  Please call our office 2  months in advance to schedule this appointment.  You may see Dr. Harrell Gave or one of the following Advanced Practice Providers on your designated Care Team:   Rosaria Ferries, PA-C . Jory Sims, DNP, ANP  Your cardiac CT will be scheduled at one of the below locations:   [facility] [address] [place], [place] [address] [phone]  Please arrive at the [facility] main entrance of [facility] 30-45 minutes prior to test start time. Proceed to the [facility] Radiology Department (first floor) to check-in and test prep.  Please follow these instructions carefully (unless otherwise directed):   On the Night Before the Test: . Be sure to Drink plenty of water. . Do not consume any caffeinated/decaffeinated beverages or chocolate 12 hours prior to your test. . Do not take any antihistamines 12 hours prior to your test.  On the Day of the Test: . Drink plenty of water. Do not drink any water within one hour of the test. . Do not eat any food 4 hours prior to the test. . You may take your regular medications prior to the test.  . Take metoprolol (Lopressor) two hours prior to test. . FEMALES- please wear underwire-free bra if available       After the Test: . Drink plenty of water. . After receiving IV contrast, you may experience a mild flushed feeling. This is normal. . On occasion, you may experience a mild rash up to 24 hours after the test. This is not dangerous. If this occurs, you can take Benadryl 25 mg and increase your fluid intake. . If you experience trouble breathing, this can be serious. If it is severe call 911 IMMEDIATELY. If it is mild, please call our office. . If you take any of these medications: Glipizide/Metformin, Avandament, Glucavance, please do not take 48 hours after completing test.    Please contact the cardiac imaging nurse navigator should you have any questions/concerns Marchia Bond, RN Navigator Cardiac [place] [facility] [phone] Office  [phone] Cell       Signed, Angela Dresser, MD PhD 05/25/2019 6:13 PM    [facility]

## 2019-05-25 NOTE — Patient Instructions (Addendum)
Medication Instructions:  Your Physician recommend you continue on your current medication as directed.    If you need a refill on your cardiac medications before your next appointment, please call your pharmacy.   Lab work: Your physician recommends that you return for lab work 1 week prior to procedure  If you have labs (blood work) drawn today and your tests are completely normal, you will receive your results only by: Marland Kitchen MyChart Message (if you have MyChart) OR . A paper copy in the mail If you have any lab test that is abnormal or we need to change your treatment, we will call you to review the results.  Testing/Procedures: Your physician has requested that you have cardiac CT. Cardiac computed tomography (CT) is a painless test that uses an x-ray machine to take clear, detailed pictures of your heart. For further information please visit HugeFiesta.tn. Please follow instruction sheet as given.  Our physician has recommended that you wear an 14  DAY ZIO-PATCH monitor. The Zio patch cardiac monitor continuously records heart rhythm data for up to 14 days, this is for patients being evaluated for multiple types heart rhythms. For the first 24 hours post application, please avoid getting the Zio monitor wet in the shower or by excessive sweating during exercise. After that, feel free to carry on with regular activities. Keep soaps and lotions away from the ZIO XT Patch.   This will be placed at our [facility] location - [address], [address].        Follow-Up: At [facility], you and your health needs are our priority.  As part of our continuing mission to provide you with exceptional heart care, we have created designated Provider Care Teams.  These Care Teams include your primary Cardiologist (physician) and Advanced Practice Providers (APPs -  Physician Assistants and Nurse Practitioners) who all work together to provide you with the care you need, when you need it. You  will need a follow up appointment in 3 months.  Please call our office 2 months in advance to schedule this appointment.  You may see Dr. Harrell Gave or one of the following Advanced Practice Providers on your designated Care Team:   Rosaria Ferries, PA-C . Jory Sims, DNP, ANP  Your cardiac CT will be scheduled at one of the below locations:   [facility] [address] [place],  [address] [phone]  Please arrive at the [facility] main entrance of [facility] 30-45 minutes prior to test start time. Proceed to the [facility] Radiology Department (first floor) to check-in and test prep.  Please follow these instructions carefully (unless otherwise directed):   On the Night Before the Test: . Be sure to Drink plenty of water. . Do not consume any caffeinated/decaffeinated beverages or chocolate 12 hours prior to your test. . Do not take any antihistamines 12 hours prior to your test.  On the Day of the Test: . Drink plenty of water. Do not drink any water within one hour of the test. . Do not eat any food 4 hours prior to the test. . You may take your regular medications prior to the test.  . Take metoprolol (Lopressor) two hours prior to test. . FEMALES- please wear underwire-free bra if available       After the Test: . Drink plenty of water. . After receiving IV contrast, you may experience a mild flushed feeling. This is normal. . On occasion, you may experience  a mild rash up to 24 hours after the test. This is not dangerous. If this occurs, you can take Benadryl 25 mg and increase your fluid intake. . If you experience trouble breathing, this can be serious. If it is severe call 911 IMMEDIATELY. If it is mild, please call our office. . If you take any of these medications: Glipizide/Metformin, Avandament, Glucavance, please do not take 48 hours after completing test.    Please contact the cardiac imaging nurse navigator should you have  any questions/concerns Marchia Bond, RN Navigator Cardiac [place] [facility] [phone] Office  [phone] Cell

## 2019-05-26 ENCOUNTER — Telehealth: Payer: Self-pay | Admitting: Radiology

## 2019-05-26 NOTE — Telephone Encounter (Signed)
Enrolled patient for a 14 day Zio monitor to be mailed. Brief instructions were gone over with the patient and she knows to expect the monitor to arrive in 3-4 days.  

## 2019-05-27 ENCOUNTER — Ambulatory Visit (HOSPITAL_COMMUNITY): Admission: RE | Admit: 2019-05-27 | Payer: BC Managed Care – PPO | Source: Ambulatory Visit

## 2019-05-27 ENCOUNTER — Encounter (HOSPITAL_COMMUNITY): Payer: Self-pay

## 2019-06-01 ENCOUNTER — Ambulatory Visit (INDEPENDENT_AMBULATORY_CARE_PROVIDER_SITE_OTHER): Payer: BC Managed Care – PPO

## 2019-06-01 DIAGNOSIS — R072 Precordial pain: Secondary | ICD-10-CM | POA: Diagnosis not present

## 2019-06-01 DIAGNOSIS — R002 Palpitations: Secondary | ICD-10-CM | POA: Diagnosis not present

## 2019-06-07 ENCOUNTER — Other Ambulatory Visit: Payer: Self-pay

## 2019-06-07 ENCOUNTER — Ambulatory Visit (HOSPITAL_COMMUNITY)
Admission: RE | Admit: 2019-06-07 | Discharge: 2019-06-07 | Disposition: A | Discharge status: Home / Self Care | Payer: BC Managed Care – PPO | Source: Ambulatory Visit | Attending: Adult Health Nurse Practitioner | Admitting: Adult Health Nurse Practitioner

## 2019-06-07 DIAGNOSIS — I6523 Occlusion and stenosis of bilateral carotid arteries: Secondary | ICD-10-CM | POA: Diagnosis not present

## 2019-06-07 DIAGNOSIS — I10 Essential (primary) hypertension: Secondary | ICD-10-CM | POA: Diagnosis not present

## 2019-06-07 DIAGNOSIS — R42 Dizziness and giddiness: Secondary | ICD-10-CM

## 2019-06-10 DIAGNOSIS — Z01812 Encounter for preprocedural laboratory examination: Secondary | ICD-10-CM | POA: Diagnosis not present

## 2019-06-11 LAB — BASIC METABOLIC PANEL
BUN/Creatinine Ratio: 18 (ref 9–23)
BUN: 12 mg/dL (ref 6–24)
CO2: 23 mmol/L (ref 20–29)
Calcium: 9.8 mg/dL (ref 8.7–10.2)
Chloride: 99 mmol/L (ref 96–106)
Creatinine, Ser: 0.66 mg/dL (ref 0.57–1.00)
GFR calc Af Amer: 118 mL/min/{1.73_m2} (ref >59)
GFR calc non Af Amer: 103 mL/min/{1.73_m2} (ref >59)
Glucose: 69 mg/dL (ref 65–99)
Potassium: 4.4 mmol/L (ref 3.5–5.2)
Sodium: 140 mmol/L (ref 134–144)

## 2019-06-14 ENCOUNTER — Telehealth (HOSPITAL_COMMUNITY): Payer: Self-pay | Admitting: Emergency Medicine

## 2019-06-14 NOTE — Telephone Encounter (Signed)
Reaching out to patient to offer assistance regarding upcoming cardiac imaging study; pt verbalizes understanding of appt date/time, parking situation and where to check in, pre-test NPO status and medications ordered, and verified current allergies; name and call back number provided for further questions should they arise Marchia Bond RN [facility] [phone] office [phone] cell  Pt states will have ride home

## 2019-06-15 ENCOUNTER — Ambulatory Visit (HOSPITAL_COMMUNITY)
Admission: RE | Admit: 2019-06-15 | Discharge: 2019-06-15 | Disposition: A | Discharge status: Home / Self Care | Payer: BC Managed Care – PPO | Source: Ambulatory Visit | Attending: Cardiology | Admitting: Cardiology

## 2019-06-15 ENCOUNTER — Other Ambulatory Visit: Payer: Self-pay

## 2019-06-15 DIAGNOSIS — R072 Precordial pain: Secondary | ICD-10-CM | POA: Diagnosis not present

## 2019-06-15 MED ORDER — IOHEXOL 350 MG/ML SOLN
100.0000 mL | Freq: Once | INTRAVENOUS | Status: AC | PRN
  Administered 2019-06-15: 80 mL via INTRAVENOUS

## 2019-06-15 MED ORDER — NITROGLYCERIN 0.4 MG SL SUBL
0.8000 mg | SUBLINGUAL_TABLET | Freq: Once | SUBLINGUAL | Status: AC
  Administered 2019-06-15: 13:00:00 0.8 mg via SUBLINGUAL
  Filled 2019-06-15: qty 25

## 2019-06-15 MED ORDER — NITROGLYCERIN 0.4 MG SL SUBL
SUBLINGUAL_TABLET | SUBLINGUAL | Status: AC
  Administered 2019-06-15: 13:00:00 0.8 mg via SUBLINGUAL
  Filled 2019-06-15: qty 2

## 2019-06-21 DIAGNOSIS — I10 Essential (primary) hypertension: Secondary | ICD-10-CM | POA: Diagnosis not present

## 2019-06-21 DIAGNOSIS — R42 Dizziness and giddiness: Secondary | ICD-10-CM | POA: Diagnosis not present

## 2019-06-28 ENCOUNTER — Ambulatory Visit: Payer: BLUE CROSS/BLUE SHIELD | Admitting: Cardiovascular Disease

## 2019-06-29 ENCOUNTER — Other Ambulatory Visit: Payer: Self-pay

## 2019-08-09 DIAGNOSIS — R438 Other disturbances of smell and taste: Secondary | ICD-10-CM | POA: Diagnosis not present

## 2019-08-09 DIAGNOSIS — R509 Fever, unspecified: Secondary | ICD-10-CM | POA: Diagnosis not present

## 2019-08-09 DIAGNOSIS — R5383 Other fatigue: Secondary | ICD-10-CM | POA: Diagnosis not present

## 2019-08-09 DIAGNOSIS — R531 Weakness: Secondary | ICD-10-CM | POA: Diagnosis not present

## 2019-08-24 ENCOUNTER — Telehealth: Payer: Self-pay

## 2019-08-24 NOTE — Telephone Encounter (Signed)
Appointment changed to virtual by scheduler.

## 2019-08-24 NOTE — Telephone Encounter (Signed)
Called pt to change appointment to virtual or reschedule for a later date. Left message to call back.

## 2019-08-30 ENCOUNTER — Telehealth (INDEPENDENT_AMBULATORY_CARE_PROVIDER_SITE_OTHER): Payer: BC Managed Care – PPO | Admitting: Cardiology

## 2019-08-30 ENCOUNTER — Encounter: Payer: Self-pay | Admitting: Cardiology

## 2019-08-30 VITALS — BP 123/95 | HR 84 | Ht 65.0 in | Wt 152.0 lb

## 2019-08-30 DIAGNOSIS — R002 Palpitations: Secondary | ICD-10-CM

## 2019-08-30 DIAGNOSIS — Z712 Person consulting for explanation of examination or test findings: Secondary | ICD-10-CM

## 2019-08-30 DIAGNOSIS — I471 Supraventricular tachycardia, unspecified: Secondary | ICD-10-CM | POA: Insufficient documentation

## 2019-08-30 DIAGNOSIS — Z720 Tobacco use: Secondary | ICD-10-CM | POA: Diagnosis not present

## 2019-08-30 DIAGNOSIS — R079 Chest pain, unspecified: Secondary | ICD-10-CM | POA: Diagnosis not present

## 2019-08-30 DIAGNOSIS — I10 Essential (primary) hypertension: Secondary | ICD-10-CM

## 2019-08-30 DIAGNOSIS — Z716 Tobacco abuse counseling: Secondary | ICD-10-CM

## 2019-08-30 NOTE — Patient Instructions (Signed)

## 2019-08-30 NOTE — Progress Notes (Signed)
Virtual Visit via Video Note   This visit type was conducted due to national recommendations for restrictions regarding the COVID-19 Pandemic (e.g. social distancing) in an effort to limit this patient's exposure and mitigate transmission in our community.  Due to her co-morbid illnesses, this patient is at least at moderate risk for complications without adequate follow up.  This format is felt to be most appropriate for this patient at this time.  All issues noted in this document were discussed and addressed.  A limited physical exam was performed with this format.  Please refer to the patient's chart for her consent to telehealth for [facility].   Date:  08/30/2019   ID:  [name], DOB September 29, 1965, MRN [id number]  Patient Location: Home Provider Location: Home  PCP:  Celene Squibb, MD  Cardiologist:  Buford Dresser, MD  Electrophysiologist:  None   Evaluation Performed:  Follow-Up Visit  Chief Complaint:  Follow up  History of Present Illness:    [name] is a 53 y.o. female with PMH tobacco use, seen 05/2019 for palpitations and chest tightness.  The patient does not have symptoms concerning for COVID-19 infection (fever, chills, cough, or new shortness of breath).   Today: Doing better overall. Rare pains, occasional flutters. Not like it was. Blood pressure spikes every so often, but tends to be related to triggers. We reviewed the results of her monitor and CT today, all questions answered.  Tobacco cessation: cutting back, working to quit. Tobacco and caffeine can relate to palpitations, which we discussed today.  BP: has been doing well the last few weeks, but 5-6 weeks ago had a spell of elevated BP (160/120) when very stressed at work. She has a clear association between stress/anxiety and her events. We discussed trying to find ways to manage this. Spoke today about stress/anxiety, fight or flight response and how it affects the body  Denies shortness  of breath at rest or with normal exertion. No PND, orthopnea, LE edema or unexpected weight gain. No syncope.   Past Medical History:  Diagnosis Date  . Asthma   . COPD (chronic obstructive pulmonary disease) ([place])   . Depression   . Seasonal allergies    Past Surgical History:  Procedure Laterality Date  . ABDOMINAL HYSTERECTOMY    . CHOLECYSTECTOMY    . COLONOSCOPY  05/21/2011   Procedure: COLONOSCOPY;  Surgeon: Rogene Houston, MD;  Location[facility];  Service: Endoscopy;  Laterality: N/A;  12:30  . ESOPHAGOGASTRODUODENOSCOPY    . TUBAL LIGATION       Current Meds  Medication Sig  . acetaminophen (TYLENOL) 500 MG tablet Take 1,000 mg by mouth every 6 (six) hours as needed for pain.  Marland Kitchen albuterol (PROVENTIL HFA;VENTOLIN HFA) 108 (90 BASE) MCG/ACT inhaler Inhale 2 puffs into the lungs every 6 (six) hours as needed for shortness of breath.   . budesonide-formoterol (SYMBICORT) 160-4.5 MCG/ACT inhaler Inhale 2 puffs into the lungs daily.  . fluticasone (FLONASE) 50 MCG/ACT nasal spray Place 2 sprays into both nostrils daily.  Marland Kitchen losartan (COZAAR) 25 MG tablet Take 25 mg by mouth daily.  . nebivolol (BYSTOLIC) 10 MG tablet Take 10 mg by mouth daily.   . ondansetron (ZOFRAN) 8 MG tablet Take 8 mg by mouth as needed.     Allergies:   Codeine, Penicillins, and Prilosec [omeprazole]   Social History   Tobacco Use  . Smoking status: Current Every Day Smoker    Packs/day: 0.50  Years: 23.00    Pack years: 11.50    Types: Cigarettes  . Smokeless tobacco: Never Used  Substance Use Topics  . Alcohol use: No  . Drug use: No     Family Hx: The patient's family history includes Colon cancer in an other family member.  ROS:   Please see the history of present illness.    All other systems reviewed and are negative.   Prior CV studies:   The following studies were reviewed today: Monitor results, CCTA both personally reviewed and discussed with her today  Labs/Other  Tests and Data Reviewed:    EKG:  An ECG dated 05/25/19 was personally reviewed today and demonstrated:  NSR  Recent Labs: 06/10/2019: BUN 12; Creatinine, Ser 0.66; Potassium 4.4; Sodium 140   Recent Lipid Panel No results found for: CHOL, TRIG, HDL, CHOLHDL, LDLCALC, LDLDIRECT  Wt Readings from Last 3 Encounters:  08/30/19 152 lb (68.9 kg)  06/06/16 142 lb 11.2 oz (64.7 kg)  03/15/13 141 lb (64 kg)     Objective:    Vital Signs:  BP (!) 123/95   Pulse 84   Ht 5\' 5"  (1.651 m)   Wt 152 lb (68.9 kg)   BMI 25.29 kg/m    VITAL SIGNS:  reviewed GEN:  no acute distress EYES:  sclerae anicteric, EOMI - Extraocular Movements Intact RESPIRATORY:  normal respiratory effort, symmetric expansion CARDIOVASCULAR:  no visible JVD SKIN:  no rash, lesions or ulcers. MUSCULOSKELETAL:  no obvious deformities. NEURO:  alert and oriented x 3, no obvious focal deficit PSYCH:  normal affect  ASSESSMENT & PLAN:    Palpitations, chest pain: paroxysmal SVT on monitor, test results (CCTA and monitor) reviewed today -she is relieved to hear her results -we discussed paroxysmal SVT, options for management. She is working on triggers. Will continue bystolic for now as this has made her symptoms much better  Hypertension: systolic at goal, diastolic near goal today -continue losartan, nebivolol  Tobacco cessation: The patient was counseled on tobacco cessation today for 3 minutes.  Counseling included reviewing the risks of smoking tobacco products, how it impacts the patient's current medical diagnoses and different strategies for quitting.  Pharmacotherapy to aid in tobacco cessation was not prescribed today.  COVID-19 Education: The signs and symptoms of COVID-19 were discussed with the patient and how to seek care for testing (follow up with PCP or arrange E-visit).  The importance of social distancing was discussed today.  Time:   Today, I have spent 16 minutes with the patient with telehealth  technology discussing the above problems.     Medication Adjustments/Labs and Tests Ordered: Current medicines are reviewed at length with the patient today.  Concerns regarding medicines are outlined above.   Tests Ordered: No orders of the defined types were placed in this encounter.   Medication Changes: No orders of the defined types were placed in this encounter.  Patient Instructions  Medication Instructions:  Your Physician recommend you continue on your current medication as directed.    *If you need a refill on your cardiac medications before your next appointment, please call your pharmacy*  Lab Work: None  Testing/Procedures: None  Follow-Up: At [facility], you and your health needs are our priority.  As part of our continuing mission to provide you with exceptional heart care, we have created designated Provider Care Teams.  These Care Teams include your primary Cardiologist (physician) and Advanced Practice Providers (APPs -  Physician Assistants and Nurse Practitioners) who  all work together to provide you with the care you need, when you need it.  Your next appointment:   1 year(s)  The format for your next appointment:   In Person  Provider:   Buford Dresser, MD     Signed, Buford Dresser, MD  08/30/2019 11:13 PM    [place]

## 2019-09-23 DIAGNOSIS — I10 Essential (primary) hypertension: Secondary | ICD-10-CM | POA: Diagnosis not present

## 2019-09-27 DIAGNOSIS — I10 Essential (primary) hypertension: Secondary | ICD-10-CM | POA: Diagnosis not present

## 2019-09-27 DIAGNOSIS — Z716 Tobacco abuse counseling: Secondary | ICD-10-CM | POA: Diagnosis not present

## 2019-09-27 DIAGNOSIS — F17210 Nicotine dependence, cigarettes, uncomplicated: Secondary | ICD-10-CM | POA: Diagnosis not present

## 2019-12-13 DIAGNOSIS — M25512 Pain in left shoulder: Secondary | ICD-10-CM | POA: Diagnosis not present

## 2019-12-15 DIAGNOSIS — M25512 Pain in left shoulder: Secondary | ICD-10-CM | POA: Diagnosis not present

## 2019-12-25 DIAGNOSIS — M25512 Pain in left shoulder: Secondary | ICD-10-CM | POA: Diagnosis not present

## 2020-01-04 DIAGNOSIS — M542 Cervicalgia: Secondary | ICD-10-CM | POA: Diagnosis not present

## 2020-01-04 DIAGNOSIS — M47812 Spondylosis without myelopathy or radiculopathy, cervical region: Secondary | ICD-10-CM | POA: Diagnosis not present

## 2020-01-04 DIAGNOSIS — M5030 Other cervical disc degeneration, unspecified cervical region: Secondary | ICD-10-CM | POA: Diagnosis not present

## 2020-01-04 DIAGNOSIS — M25512 Pain in left shoulder: Secondary | ICD-10-CM | POA: Diagnosis not present

## 2020-01-11 DIAGNOSIS — M542 Cervicalgia: Secondary | ICD-10-CM | POA: Diagnosis not present

## 2020-01-13 DIAGNOSIS — M542 Cervicalgia: Secondary | ICD-10-CM | POA: Diagnosis not present

## 2020-01-18 DIAGNOSIS — M542 Cervicalgia: Secondary | ICD-10-CM | POA: Diagnosis not present

## 2020-01-20 DIAGNOSIS — M542 Cervicalgia: Secondary | ICD-10-CM | POA: Diagnosis not present

## 2020-01-26 DIAGNOSIS — M542 Cervicalgia: Secondary | ICD-10-CM | POA: Diagnosis not present

## 2020-01-31 DIAGNOSIS — M542 Cervicalgia: Secondary | ICD-10-CM | POA: Diagnosis not present

## 2020-02-01 DIAGNOSIS — M542 Cervicalgia: Secondary | ICD-10-CM | POA: Diagnosis not present

## 2020-02-01 DIAGNOSIS — M5030 Other cervical disc degeneration, unspecified cervical region: Secondary | ICD-10-CM | POA: Diagnosis not present

## 2020-02-11 DIAGNOSIS — M542 Cervicalgia: Secondary | ICD-10-CM | POA: Diagnosis not present

## 2020-02-17 DIAGNOSIS — M5030 Other cervical disc degeneration, unspecified cervical region: Secondary | ICD-10-CM | POA: Diagnosis not present

## 2020-02-17 DIAGNOSIS — M542 Cervicalgia: Secondary | ICD-10-CM | POA: Diagnosis not present

## 2020-05-31 DIAGNOSIS — Z1152 Encounter for screening for COVID-19: Secondary | ICD-10-CM | POA: Diagnosis not present

## 2020-06-06 DIAGNOSIS — R5383 Other fatigue: Secondary | ICD-10-CM | POA: Diagnosis not present

## 2020-06-06 DIAGNOSIS — R002 Palpitations: Secondary | ICD-10-CM | POA: Diagnosis not present

## 2020-06-06 DIAGNOSIS — R531 Weakness: Secondary | ICD-10-CM | POA: Diagnosis not present

## 2020-06-06 DIAGNOSIS — R438 Other disturbances of smell and taste: Secondary | ICD-10-CM | POA: Diagnosis not present

## 2020-06-06 DIAGNOSIS — Z136 Encounter for screening for cardiovascular disorders: Secondary | ICD-10-CM | POA: Diagnosis not present

## 2020-06-06 DIAGNOSIS — K5733 Diverticulitis of large intestine without perforation or abscess with bleeding: Secondary | ICD-10-CM | POA: Diagnosis not present

## 2020-07-25 DIAGNOSIS — J0111 Acute recurrent frontal sinusitis: Secondary | ICD-10-CM | POA: Diagnosis not present

## 2020-08-01 ENCOUNTER — Other Ambulatory Visit (HOSPITAL_COMMUNITY): Payer: Self-pay | Admitting: Adult Health Nurse Practitioner

## 2020-08-01 ENCOUNTER — Ambulatory Visit (HOSPITAL_COMMUNITY)
Admission: RE | Admit: 2020-08-01 | Discharge: 2020-08-01 | Disposition: A | Discharge status: Home / Self Care | Payer: BC Managed Care – PPO | Source: Ambulatory Visit | Attending: Adult Health Nurse Practitioner | Admitting: Adult Health Nurse Practitioner

## 2020-08-01 ENCOUNTER — Other Ambulatory Visit: Payer: Self-pay

## 2020-08-01 DIAGNOSIS — J441 Chronic obstructive pulmonary disease with (acute) exacerbation: Secondary | ICD-10-CM | POA: Diagnosis not present

## 2020-08-01 DIAGNOSIS — J449 Chronic obstructive pulmonary disease, unspecified: Secondary | ICD-10-CM | POA: Diagnosis not present

## 2020-08-01 DIAGNOSIS — R5383 Other fatigue: Secondary | ICD-10-CM | POA: Diagnosis not present

## 2020-08-01 DIAGNOSIS — R438 Other disturbances of smell and taste: Secondary | ICD-10-CM | POA: Diagnosis not present

## 2020-08-01 DIAGNOSIS — R0602 Shortness of breath: Secondary | ICD-10-CM | POA: Diagnosis not present

## 2020-08-01 DIAGNOSIS — R531 Weakness: Secondary | ICD-10-CM | POA: Diagnosis not present

## 2020-08-01 DIAGNOSIS — Z136 Encounter for screening for cardiovascular disorders: Secondary | ICD-10-CM | POA: Diagnosis not present

## 2020-08-01 DIAGNOSIS — R059 Cough, unspecified: Secondary | ICD-10-CM | POA: Diagnosis not present

## 2020-08-08 DIAGNOSIS — R531 Weakness: Secondary | ICD-10-CM | POA: Diagnosis not present

## 2020-08-08 DIAGNOSIS — Z136 Encounter for screening for cardiovascular disorders: Secondary | ICD-10-CM | POA: Diagnosis not present

## 2020-08-08 DIAGNOSIS — R002 Palpitations: Secondary | ICD-10-CM | POA: Diagnosis not present

## 2020-08-08 DIAGNOSIS — E860 Dehydration: Secondary | ICD-10-CM | POA: Diagnosis not present

## 2020-08-08 DIAGNOSIS — R5383 Other fatigue: Secondary | ICD-10-CM | POA: Diagnosis not present

## 2020-08-08 DIAGNOSIS — R438 Other disturbances of smell and taste: Secondary | ICD-10-CM | POA: Diagnosis not present

## 2020-08-30 ENCOUNTER — Ambulatory Visit: Payer: BC Managed Care – PPO | Admitting: Cardiology

## 2020-10-16 DIAGNOSIS — M545 Low back pain, unspecified: Secondary | ICD-10-CM | POA: Diagnosis not present

## 2020-10-23 DIAGNOSIS — R52 Pain, unspecified: Secondary | ICD-10-CM | POA: Diagnosis not present

## 2020-10-23 DIAGNOSIS — M5459 Other low back pain: Secondary | ICD-10-CM | POA: Diagnosis not present

## 2020-11-10 DIAGNOSIS — M5416 Radiculopathy, lumbar region: Secondary | ICD-10-CM | POA: Diagnosis not present

## 2020-11-17 DIAGNOSIS — M48061 Spinal stenosis, lumbar region without neurogenic claudication: Secondary | ICD-10-CM | POA: Diagnosis not present

## 2020-11-17 DIAGNOSIS — M5136 Other intervertebral disc degeneration, lumbar region: Secondary | ICD-10-CM | POA: Diagnosis not present

## 2020-12-05 DIAGNOSIS — L239 Allergic contact dermatitis, unspecified cause: Secondary | ICD-10-CM | POA: Diagnosis not present

## 2020-12-05 DIAGNOSIS — M545 Low back pain, unspecified: Secondary | ICD-10-CM | POA: Diagnosis not present

## 2020-12-12 DIAGNOSIS — M5416 Radiculopathy, lumbar region: Secondary | ICD-10-CM | POA: Diagnosis not present

## 2021-01-04 DIAGNOSIS — R002 Palpitations: Secondary | ICD-10-CM | POA: Diagnosis not present

## 2021-01-04 DIAGNOSIS — R5383 Other fatigue: Secondary | ICD-10-CM | POA: Diagnosis not present

## 2021-01-04 DIAGNOSIS — M48061 Spinal stenosis, lumbar region without neurogenic claudication: Secondary | ICD-10-CM | POA: Diagnosis not present

## 2021-01-04 DIAGNOSIS — M47816 Spondylosis without myelopathy or radiculopathy, lumbar region: Secondary | ICD-10-CM | POA: Diagnosis not present

## 2021-01-04 DIAGNOSIS — Z136 Encounter for screening for cardiovascular disorders: Secondary | ICD-10-CM | POA: Diagnosis not present

## 2021-01-04 DIAGNOSIS — R438 Other disturbances of smell and taste: Secondary | ICD-10-CM | POA: Diagnosis not present

## 2021-01-04 DIAGNOSIS — R531 Weakness: Secondary | ICD-10-CM | POA: Diagnosis not present

## 2021-01-09 DIAGNOSIS — R7303 Prediabetes: Secondary | ICD-10-CM | POA: Diagnosis not present

## 2021-01-09 DIAGNOSIS — R062 Wheezing: Secondary | ICD-10-CM | POA: Diagnosis not present

## 2021-01-09 DIAGNOSIS — J45901 Unspecified asthma with (acute) exacerbation: Secondary | ICD-10-CM | POA: Diagnosis not present

## 2021-01-09 DIAGNOSIS — E782 Mixed hyperlipidemia: Secondary | ICD-10-CM | POA: Diagnosis not present

## 2021-02-01 DIAGNOSIS — M47816 Spondylosis without myelopathy or radiculopathy, lumbar region: Secondary | ICD-10-CM | POA: Diagnosis not present

## 2021-02-08 DIAGNOSIS — F319 Bipolar disorder, unspecified: Secondary | ICD-10-CM | POA: Diagnosis not present

## 2021-02-08 DIAGNOSIS — M79602 Pain in left arm: Secondary | ICD-10-CM | POA: Diagnosis not present

## 2021-02-08 DIAGNOSIS — R002 Palpitations: Secondary | ICD-10-CM | POA: Diagnosis not present

## 2021-02-08 DIAGNOSIS — I10 Essential (primary) hypertension: Secondary | ICD-10-CM | POA: Diagnosis not present

## 2021-02-08 DIAGNOSIS — F411 Generalized anxiety disorder: Secondary | ICD-10-CM | POA: Diagnosis not present

## 2021-03-06 DIAGNOSIS — I10 Essential (primary) hypertension: Secondary | ICD-10-CM | POA: Diagnosis not present

## 2021-03-06 DIAGNOSIS — F319 Bipolar disorder, unspecified: Secondary | ICD-10-CM | POA: Diagnosis not present

## 2021-03-06 DIAGNOSIS — F411 Generalized anxiety disorder: Secondary | ICD-10-CM | POA: Diagnosis not present

## 2021-03-06 DIAGNOSIS — H6122 Impacted cerumen, left ear: Secondary | ICD-10-CM | POA: Diagnosis not present

## 2021-03-07 ENCOUNTER — Emergency Department (HOSPITAL_COMMUNITY): Payer: BC Managed Care – PPO

## 2021-03-07 ENCOUNTER — Encounter (HOSPITAL_COMMUNITY): Payer: Self-pay

## 2021-03-07 ENCOUNTER — Other Ambulatory Visit: Payer: Self-pay

## 2021-03-07 ENCOUNTER — Emergency Department (HOSPITAL_COMMUNITY)
Admission: EM | Admit: 2021-03-07 | Discharge: 2021-03-07 | Disposition: A | Discharge status: Home / Self Care | Payer: BC Managed Care – PPO | Attending: Emergency Medicine | Admitting: Emergency Medicine

## 2021-03-07 DIAGNOSIS — R079 Chest pain, unspecified: Secondary | ICD-10-CM | POA: Diagnosis not present

## 2021-03-07 DIAGNOSIS — F17210 Nicotine dependence, cigarettes, uncomplicated: Secondary | ICD-10-CM | POA: Diagnosis not present

## 2021-03-07 DIAGNOSIS — J45909 Unspecified asthma, uncomplicated: Secondary | ICD-10-CM | POA: Insufficient documentation

## 2021-03-07 DIAGNOSIS — J449 Chronic obstructive pulmonary disease, unspecified: Secondary | ICD-10-CM | POA: Diagnosis not present

## 2021-03-07 DIAGNOSIS — Z79899 Other long term (current) drug therapy: Secondary | ICD-10-CM | POA: Diagnosis not present

## 2021-03-07 DIAGNOSIS — I10 Essential (primary) hypertension: Secondary | ICD-10-CM | POA: Insufficient documentation

## 2021-03-07 DIAGNOSIS — R030 Elevated blood-pressure reading, without diagnosis of hypertension: Secondary | ICD-10-CM | POA: Diagnosis not present

## 2021-03-07 LAB — CBC
HCT: 48.6 % — ABNORMAL HIGH (ref 36.0–46.0)
Hemoglobin: 16.3 g/dL — ABNORMAL HIGH (ref 12.0–15.0)
MCH: 32.7 pg (ref 26.0–34.0)
MCHC: 33.5 g/dL (ref 30.0–36.0)
MCV: 97.6 fL (ref 80.0–100.0)
Platelets: 183 10*3/uL (ref 150–400)
RBC: 4.98 MIL/uL (ref 3.87–5.11)
RDW: 12.2 % (ref 11.5–15.5)
WBC: 8.5 10*3/uL (ref 4.0–10.5)
nRBC: 0 % (ref 0.0–0.2)

## 2021-03-07 LAB — URINALYSIS, ROUTINE W REFLEX MICROSCOPIC
Bacteria, UA: NONE SEEN
Bilirubin Urine: NEGATIVE
Glucose, UA: NEGATIVE mg/dL
Ketones, ur: NEGATIVE mg/dL
Leukocytes,Ua: NEGATIVE
Nitrite: NEGATIVE
Protein, ur: NEGATIVE mg/dL
Specific Gravity, Urine: 1.024 (ref 1.005–1.030)
pH: 5 (ref 5.0–8.0)

## 2021-03-07 LAB — TROPONIN I (HIGH SENSITIVITY): Troponin I (High Sensitivity): <2 ng/L (ref <18)

## 2021-03-07 LAB — BASIC METABOLIC PANEL
Anion gap: 7 (ref 5–15)
BUN: 12 mg/dL (ref 6–20)
CO2: 30 mmol/L (ref 22–32)
Calcium: 9.4 mg/dL (ref 8.9–10.3)
Chloride: 102 mmol/L (ref 98–111)
Creatinine, Ser: 0.88 mg/dL (ref 0.44–1.00)
GFR, Estimated: >60 mL/min (ref >60)
Glucose, Bld: 162 mg/dL — ABNORMAL HIGH (ref 70–99)
Potassium: 3.4 mmol/L — ABNORMAL LOW (ref 3.5–5.1)
Sodium: 139 mmol/L (ref 135–145)

## 2021-03-07 NOTE — Discharge Instructions (Addendum)
As discussed, your labs, EKG and chest x-ray are reassuring today.  Do not forget about having your fasting blood sugar completed by your MD.  Continue taking the new medications for your blood pressure that you started today.  Follow-up with your doctor next week as planned.

## 2021-03-07 NOTE — ED Provider Notes (Signed)
[facility] EMERGENCY DEPARTMENT Provider Note   CSN: [id number] Arrival date & time: 03/07/21  1333     History Chief Complaint  Patient presents with   Hypertension    [name] is a 55 y.o. female with a history of asthma, COPD, SVT and essential hypertension presenting for evaluation of persistent elevated blood pressures.  She checks her blood pressure 3 times daily and her blood pressure numbers have been consistently elevated.  She describes intermittent problems with headache in association with high blood pressures.  She checked her blood pressure prior to arrival today and it was 190/110.  She also reports occasional episodes of left upper arm tightness when she is in a stressful environment with this pain sometimes radiating into her chest.  She is under the care of Dr. Harrell Gave of cardiology and has undergone Holter monitoring and had a cardiac CT scan in 2020 secondary to this symptom and had a negative test with the conclusion that these episodes were stress-induced.  Her last episode of this pressure sensation occurred yesterday while at work.  She denies vision changes, nausea or vomiting, diaphoresis.  She does endorse ankle edema when she is standing on her feet for long periods of time at work especially in the heat of the summer time.  She denies orthopnea, shortness of breath.  She was seen by her PCP yesterday due to these elevated blood pressures and her medications have been adjusted and she just started taking a new dose of amlodipine today and continues to take her Bystolic unchanged.  The history is provided by the patient.      Past Medical History:  Diagnosis Date   Asthma    COPD (chronic obstructive pulmonary disease) ([place])    Depression    Seasonal allergies     Patient Active Problem List   Diagnosis Date Noted   Paroxysmal SVT (supraventricular tachycardia) ([place]) 08/30/2019   Palpitation 05/25/2019   Precordial pain 05/25/2019   Essential  hypertension 05/25/2019   Tobacco abuse counseling 05/25/2019    Past Surgical History:  Procedure Laterality Date   ABDOMINAL HYSTERECTOMY     CHOLECYSTECTOMY     COLONOSCOPY  05/21/2011   Procedure: COLONOSCOPY;  Surgeon: Rogene Houston, MD;  Location[facility];  Service: Endoscopy;  Laterality: N/A;  12:30   ESOPHAGOGASTRODUODENOSCOPY     TUBAL LIGATION       OB History   No obstetric history on file.     Family History  Problem Relation Age of Onset   Colon cancer Other     Social History   Tobacco Use   Smoking status: Every Day    Packs/day: 0.50    Years: 23.00    Pack years: 11.50    Types: Cigarettes   Smokeless tobacco: Never  Vaping Use   Vaping Use: Never used  Substance Use Topics   Alcohol use: No   Drug use: No    Home Medications Prior to Admission medications   Medication Sig Start Date End Date Taking? Authorizing Provider  acetaminophen (TYLENOL) 500 MG tablet Take 1,000 mg by mouth every 6 (six) hours as needed for pain.   Yes [provider]  albuterol (PROVENTIL HFA;VENTOLIN HFA) 108 (90 BASE) MCG/ACT inhaler Inhale 2 puffs into the lungs every 6 (six) hours as needed for shortness of breath.    Yes [provider]  ALPRAZolam (XANAX) 0.25 MG tablet Take 0.25 mg by mouth 2 (two) times daily. 02/08/21  Yes  [provider]  amLODipine (NORVASC) 5 MG tablet Take 1 tablet by mouth daily. 03/06/21  Yes [provider]  celecoxib (CELEBREX) 200 MG capsule Take 200 mg by mouth 2 (two) times daily. 02/11/21  Yes [provider]  nebivolol (BYSTOLIC) 10 MG tablet Take 10 mg by mouth daily.    Yes [provider]  VRAYLAR 1.5 MG capsule Take 1.5 mg by mouth daily. 03/06/21  Yes [provider]    Allergies    Bee venom, Codeine, Penicillins, Prilosec [omeprazole], and Zantac [ranitidine]  Review of Systems   Review of Systems  Constitutional:  Negative for chills, diaphoresis and  fever.  HENT:  Negative for congestion and sore throat.   Eyes: Negative.  Negative for visual disturbance.  Respiratory:  Positive for chest tightness. Negative for shortness of breath.   Cardiovascular:  Negative for chest pain.  Gastrointestinal:  Negative for abdominal pain, nausea and vomiting.  Genitourinary: Negative.   Musculoskeletal:  Negative for arthralgias, joint swelling and neck pain.  Skin: Negative.  Negative for rash and wound.  Neurological:  Positive for headaches. Negative for dizziness, weakness, light-headedness and numbness.  Psychiatric/Behavioral: Negative.    All other systems reviewed and are negative.  Physical Exam Updated Vital Signs BP (!) 141/90   Pulse 78   Temp 98.7 F (37.1 C) (Oral)   Resp 12   Ht 5\' 2"  (1.575 m)   Wt 68.9 kg   SpO2 98%   BMI 27.80 kg/m   Physical Exam Vitals and nursing note reviewed.  Constitutional:      Appearance: She is well-developed.  HENT:     Head: Normocephalic and atraumatic.  Eyes:     Conjunctiva/sclera: Conjunctivae normal.  Cardiovascular:     Rate and Rhythm: Normal rate and regular rhythm.     Heart sounds: Normal heart sounds.  Pulmonary:     Effort: Pulmonary effort is normal.     Breath sounds: Normal breath sounds. No wheezing.     Comments: Faint expiratory wheeze left upper lung field.  No respiratory distress. Abdominal:     General: Bowel sounds are normal.     Palpations: Abdomen is soft.     Tenderness: There is no abdominal tenderness. There is no guarding.  Musculoskeletal:        General: Normal range of motion.     Cervical back: Normal range of motion.  Skin:    General: Skin is warm and dry.  Neurological:     Mental Status: She is alert.    ED Results / Procedures / Treatments   Labs (all labs ordered are listed, but only abnormal results are displayed) Labs Reviewed  CBC - Abnormal; Notable for the following components:      Result Value   Hemoglobin 16.3 (*)    HCT  48.6 (*)    All other components within normal limits  BASIC METABOLIC PANEL - Abnormal; Notable for the following components:   Potassium 3.4 (*)    Glucose, Bld 162 (*)    All other components within normal limits  URINALYSIS, ROUTINE W REFLEX MICROSCOPIC - Abnormal; Notable for the following components:   APPearance HAZY (*)    Hgb urine dipstick SMALL (*)    All other components within normal limits  TROPONIN I (HIGH SENSITIVITY)    EKG EKG Interpretation  Date/Time:  Wednesday March 07 2021 14:17:34 EDT Ventricular Rate:  81 PR Interval:  160 QRS Duration: 92 QT Interval:  390 QTC  Calculation: 453 R Axis:   85 Text Interpretation: Sinus rhythm Baseline wander in lead(s) V3 V4 No old tracing to compare Confirmed by Isla Pence [id number]) on 03/07/2021 2:19:55 PM  Radiology DG Chest Port 1 View  Result Date: 03/07/2021 CLINICAL DATA:  Hypertension and chest pain EXAM: PORTABLE CHEST 1 VIEW COMPARISON:  08/01/2020 FINDINGS: Cardiac shadow is within normal limits. The lungs are well aerated bilaterally. No focal infiltrate or effusion is seen. No acute bony abnormality is noted. IMPRESSION: No active disease. Electronically Signed   By: Inez Catalina M.D.   On: 03/07/2021 15:06    Procedures Procedures   Medications Ordered in ED Medications - No data to display  ED Course  I have reviewed the triage vital signs and the nursing notes.  Pertinent labs & imaging results that were available during my care of the patient were reviewed by me and considered in my medical decision making (see chart for details).    MDM Rules/Calculators/A&P                          Patient is here secondary to persistent elevated blood pressures, most recently 190/110 when checked at home.  Her pressures have been better here, her most recent measurement was 141/90.  She does not have any evidence of endorgan damage, chest x-ray, EKG, labs are all reassuring, she has a normal creatinine with no  indication of renal compromise.  She has no protein in her urine.  She does have an elevated blood glucose level at 162.  She does not have a history of diabetes.  She ate at [employer] just prior to arrival and drink a diet soda.  She does state that her blood glucose levels have been elevated for her PCP as well and she is supposed to go in for a fasting blood sugar.  She also has follow-up in 1 week for recheck of her blood pressure.  In addition to her medication list she states she was given a 7-day supply of a blood pressure medication to try, she does not know the name of this medication.  The patient appears reasonably screened and/or stabilized for discharge and I doubt any other medical condition or other [facility] requiring further screening, evaluation, or treatment in the ED at this time prior to discharge.  Final Clinical Impression(s) / ED Diagnoses Final diagnoses:  Primary hypertension    Rx / DC Orders ED Discharge Orders     None        Landis Martins 03/07/21 [place]    Isla Pence, MD 03/08/21 [phone]

## 2021-03-07 NOTE — ED Triage Notes (Signed)
Pt to er, pt states that she is here for htn, states that she checks her blood pressure regularly and it was 190/110 earlier today, states that when this happens she gets a little headache and some L sided chest/arm pain.  Pt denies chest pain at this time, resps even and unlabored

## 2021-03-14 DIAGNOSIS — I10 Essential (primary) hypertension: Secondary | ICD-10-CM | POA: Diagnosis not present

## 2021-04-16 ENCOUNTER — Ambulatory Visit: Payer: BC Managed Care – PPO | Admitting: Internal Medicine

## 2021-04-16 ENCOUNTER — Other Ambulatory Visit: Payer: Self-pay

## 2021-04-16 ENCOUNTER — Encounter: Payer: Self-pay | Admitting: Internal Medicine

## 2021-04-16 VITALS — BP 124/82 | HR 74 | Ht 62.0 in | Wt 149.8 lb

## 2021-04-16 DIAGNOSIS — I10 Essential (primary) hypertension: Secondary | ICD-10-CM | POA: Diagnosis not present

## 2021-04-16 MED ORDER — NEBIVOLOL HCL 5 MG PO TABS: 5.0000 mg | ORAL_TABLET | Freq: Every day | ORAL | 3 refills | Status: DC

## 2021-04-16 MED ORDER — AMLODIPINE BESYLATE 2.5 MG PO TABS: 2.5000 mg | ORAL_TABLET | Freq: Every day | ORAL | 3 refills | Status: DC

## 2021-04-16 NOTE — Patient Instructions (Signed)
Medication Instructions:   Decrease Bystolic to 5 mg Daily  Decrease Amlodipine to 2.5 mg Daily   *If you need a refill on your cardiac medications before your next appointment, please call your pharmacy*   Lab Work: NONE   If you have labs (blood work) drawn today and your tests are completely normal, you will receive your results only by: [place] (if you have MyChart) OR A paper copy in the mail If you have any lab test that is abnormal or we need to change your treatment, we will call you to review the results.   Testing/Procedures: NONE    Follow-Up: At [facility], you and your health needs are our priority.  As part of our continuing mission to provide you with exceptional heart care, we have created designated Provider Care Teams.  These Care Teams include your primary Cardiologist (physician) and Advanced Practice Providers (APPs -  Physician Assistants and Nurse Practitioners) who all work together to provide you with the care you need, when you need it.  We recommend signing up for the patient portal called "MyChart".  Sign up information is provided on this After Visit Summary.  MyChart is used to connect with patients for Virtual Visits (Telemedicine).  Patients are able to view lab/test results, encounter notes, upcoming appointments, etc.  Non-urgent messages can be sent to your provider as well.   To learn more about what you can do with MyChart, go to [email or web address].    Your next appointment:    To Be Determined   The format for your next appointment:   In Person  Provider:   Dorris Carnes, MD   Other Instructions Thank you for choosing [facility]!

## 2021-04-16 NOTE — Progress Notes (Signed)
Cardiology Office Note   Date:  04/16/2021   ID:  [name], DOB 03-25-1966, MRN [id number]  PCP:  Celene Squibb, MD  Cardiologist:   Dorris Carnes, MD   Pt presents for evaluation of HTN and chest tightness      History of Present Illness: [name] is a 55 y.o. female with a history of asthma, COPD, SVT, HTN   Has had CP in past   Seen by B Christopher in cardiology   CT coronary angiogram done   Calcium score 0   No CAD noted   Pt also had monitor which showed SR with short burst SVT  (5 beats longest).  She was seen in ED on 6/15 /22 for presistent elevation of BP  Checks BP 3x per day   On day of ED visit was 190/110   Also with some L upper arm tightnes when stressed  BP improved when her BP was treated   Pt had been on Bystolic and Edbari  Amldipine was added to regimen   Pt says now her BP is lwer   But at times she feels like she may pass out.    She denies CP      Current Meds  Medication Sig   acetaminophen (TYLENOL) 500 MG tablet Take 1,000 mg by mouth every 6 (six) hours as needed for pain.   albuterol (PROVENTIL HFA;VENTOLIN HFA) 108 (90 BASE) MCG/ACT inhaler Inhale 2 puffs into the lungs every 6 (six) hours as needed for shortness of breath.    ALPRAZolam (XANAX) 0.5 MG tablet Take 0.5 mg by mouth 2 (two) times daily as needed.   amLODipine (NORVASC) 2.5 MG tablet Take 1 tablet (2.5 mg total) by mouth daily.   EDARBI 80 MG TABS Take 1 tablet by mouth daily.   nebivolol (BYSTOLIC) 5 MG tablet Take 1 tablet (5 mg total) by mouth daily.   VRAYLAR 1.5 MG capsule Take 1.5 mg by mouth daily.   [DISCONTINUED] amLODipine (NORVASC) 5 MG tablet Take 1 tablet by mouth daily.   [DISCONTINUED] nebivolol (BYSTOLIC) 10 MG tablet Take 10 mg by mouth daily.      Allergies:   Bee venom, Codeine, Penicillins, Prilosec [omeprazole], and Zantac [ranitidine]   Past Medical History:  Diagnosis Date   Asthma    COPD (chronic obstructive pulmonary disease) ([place])    Depression     Seasonal allergies     Past Surgical History:  Procedure Laterality Date   ABDOMINAL HYSTERECTOMY     CHOLECYSTECTOMY     COLONOSCOPY  05/21/2011   Procedure: COLONOSCOPY;  Surgeon: Rogene Houston, MD;  Location: [facility];  Service: Endoscopy;  Laterality: N/A;  12:30   ESOPHAGOGASTRODUODENOSCOPY     TUBAL LIGATION       Social History:  The patient  reports that she has been smoking cigarettes. She has a 23.00 pack-year smoking history. She has never used smokeless tobacco. She reports that she does not drink alcohol and does not use drugs.   Family History:  The patient's family history includes Colon cancer in an other family member.    ROS:  Please see the history of present illness. All other systems are reviewed and  Negative to the above problem except as noted.    PHYSICAL EXAM: VS:  BP 124/82   Pulse 74   Ht '5\' 2"'$  (1.575 m)   Wt 149 lb 12.8 oz (67.9 kg)   BMI 27.40 kg/m   GEN:  Well nourished, well developed, in no acute distress  HEENT: normal  Neck: no JVD, carotid bruits, Cardiac: RRR; no murmurs.  No LE edema  Respiratory:  clear to auscultation bilaterally,  GI: soft, nontender, nondistended, + BS  No hepatomegaly  MS: no deformity Moving all extremities   Skin: warm and dry, no rash Neuro:  Strength and sensation are intact Psych: euthymic mood, full affect   EKG:  EKG is not ordered today.  On 03/08/21  SR 81 bpm      Lipid Panel No results found for: CHOL, TRIG, HDL, CHOLHDL, VLDL, LDLCALC, LDLDIRECT    Wt Readings from Last 3 Encounters:  04/16/21 149 lb 12.8 oz (67.9 kg)  03/07/21 152 lb (68.9 kg)  08/30/19 152 lb (68.9 kg)      ASSESSMENT AND PLAN:  1  Chest pain   I do no think episode represent cardiac ischemia Improved with BP control    Follow  2  HTN   BP is much better   She actually complains of dizziness, Would cut amlodipine to 2.5 mg and Bystolic to 5 mg   Continue Edarbi    Follow  BP        Current medicines are  reviewed at length with the patient today.  The patient does not have concerns regarding medicines.  Signed, Dorris Carnes, MD  04/16/2021 8:53 PM    [address], [place], [place]  [address] Phone: [phone]; Fax: [phone]

## 2021-04-24 DIAGNOSIS — I10 Essential (primary) hypertension: Secondary | ICD-10-CM | POA: Diagnosis not present

## 2021-05-07 IMAGING — US US CAROTID DUPLEX BILAT
1 series · 13 of 24 positions shown · non-contrast
Comparison: None.

CLINICAL DATA: 53-year-old female with a history of vertigo

EXAM:
BILATERAL CAROTID DUPLEX ULTRASOUND
TECHNIQUE: Gray scale imaging, color Doppler and duplex ultrasound were
performed of bilateral carotid and vertebral arteries in the neck.

[Series 1: us carotid duplex bilat · 0.05mm/px · 13 of 69 slices shown]
[im 1/69]
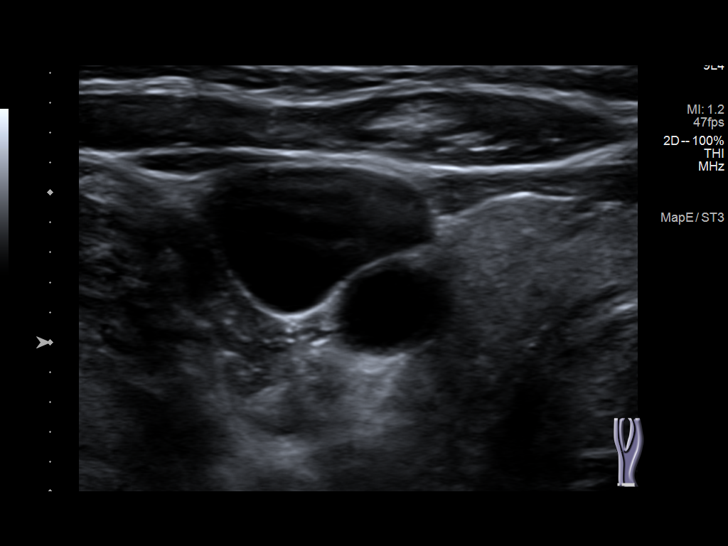
[im 6/69]
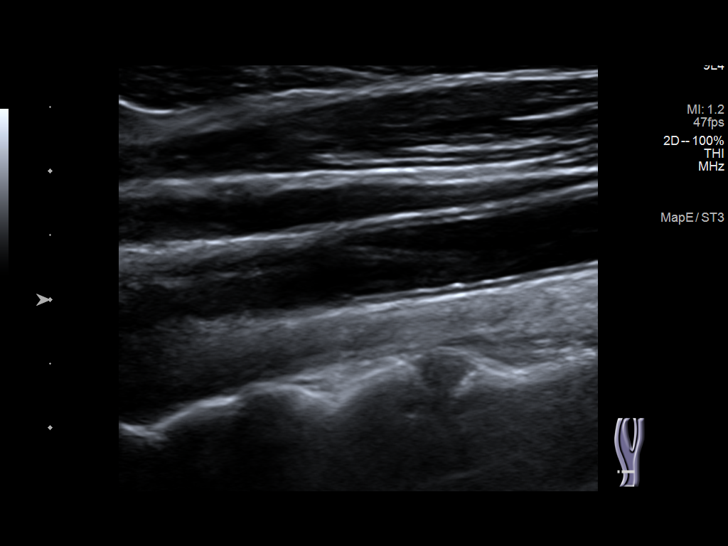
[im 12/69]
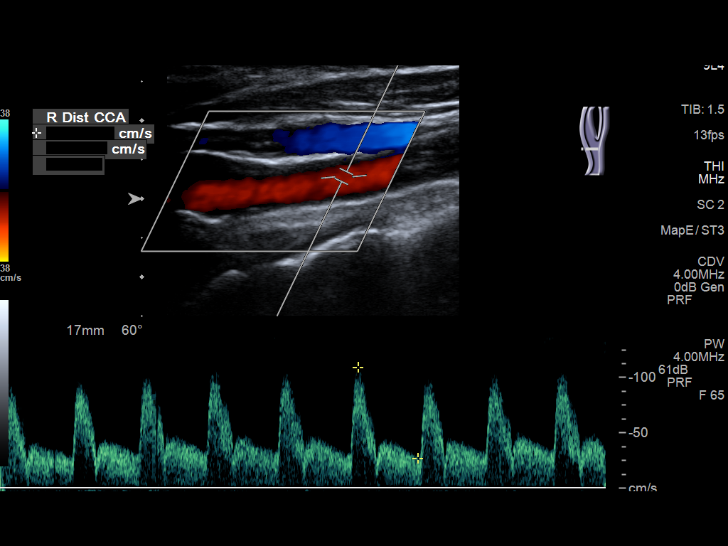
[im 18/69]
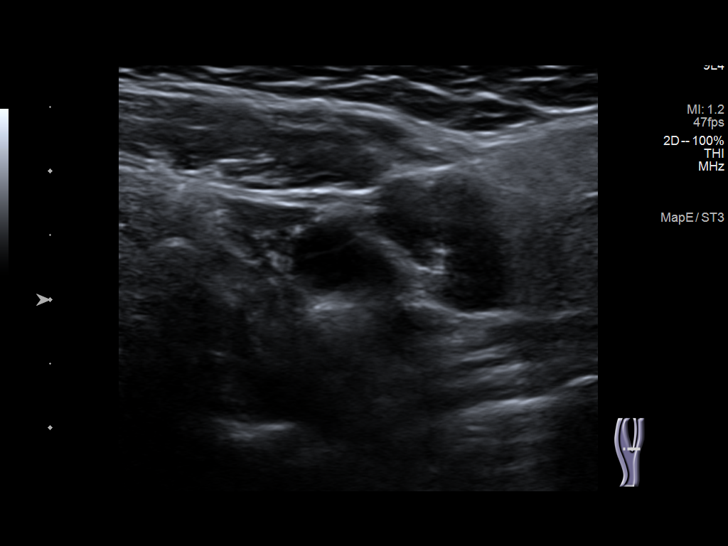
[im 24/69]
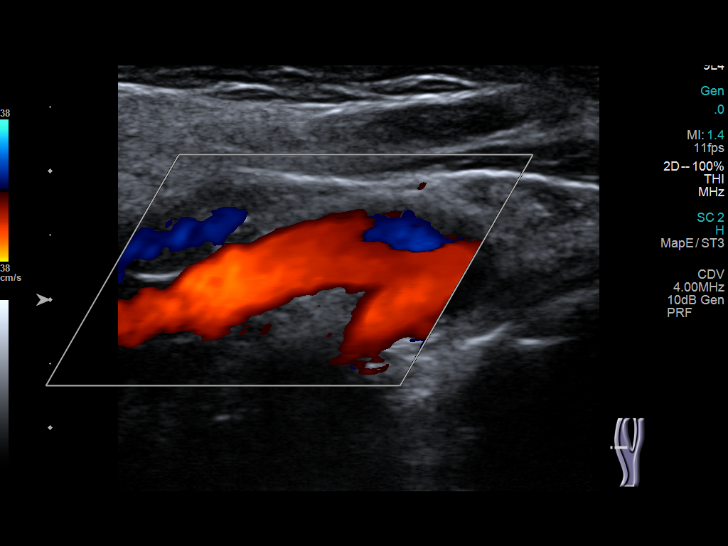
[im 30/69]
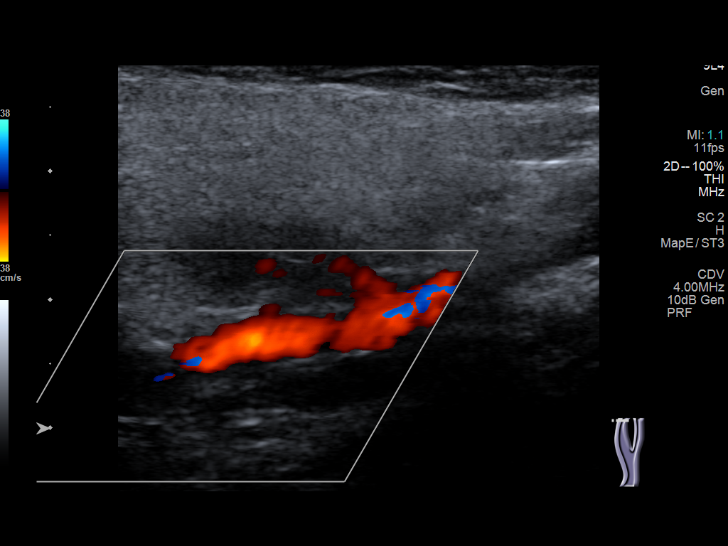
[im 36/69]
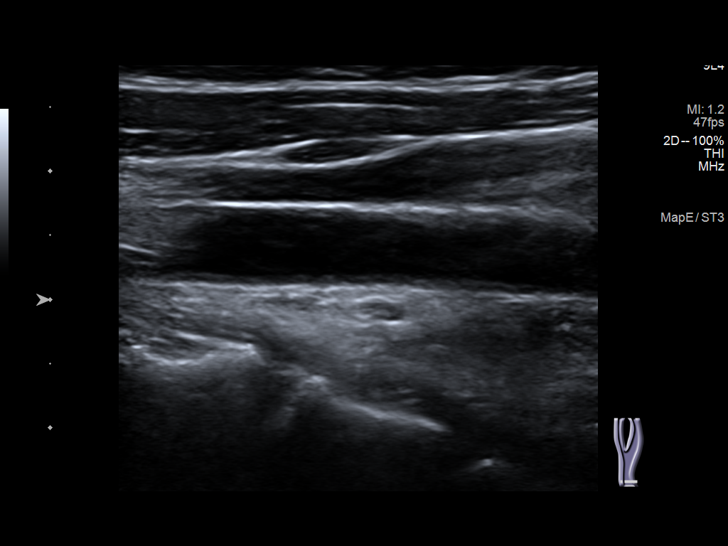
[im 39/69]
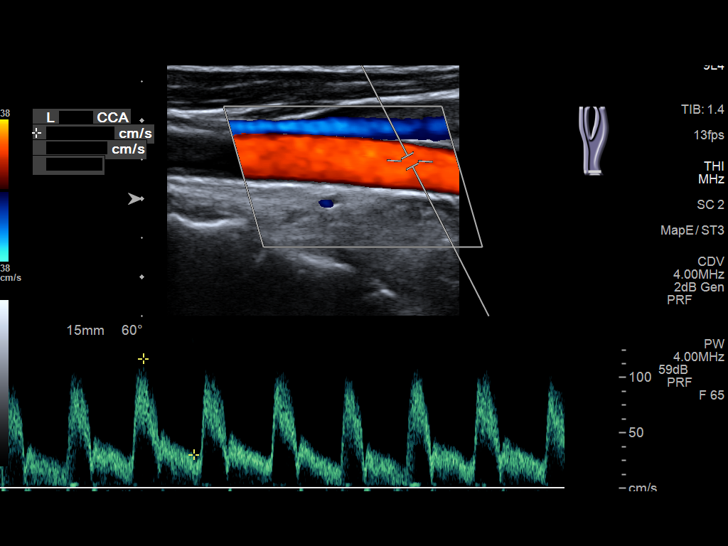
[im 45/69]
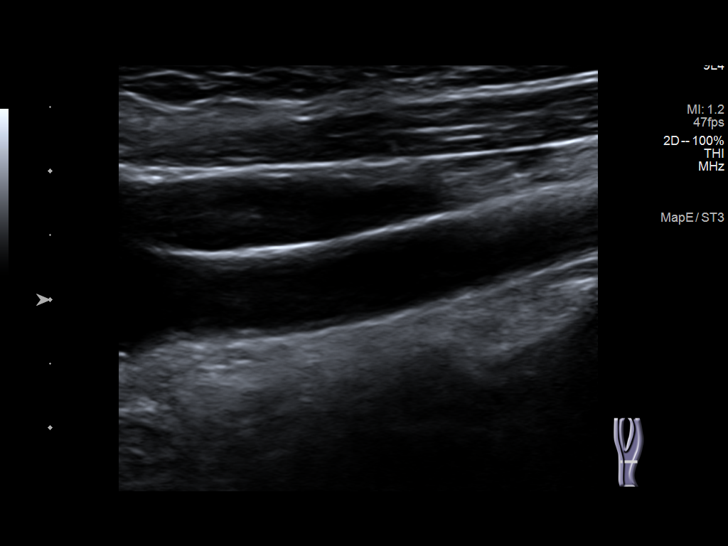
[im 51/69]
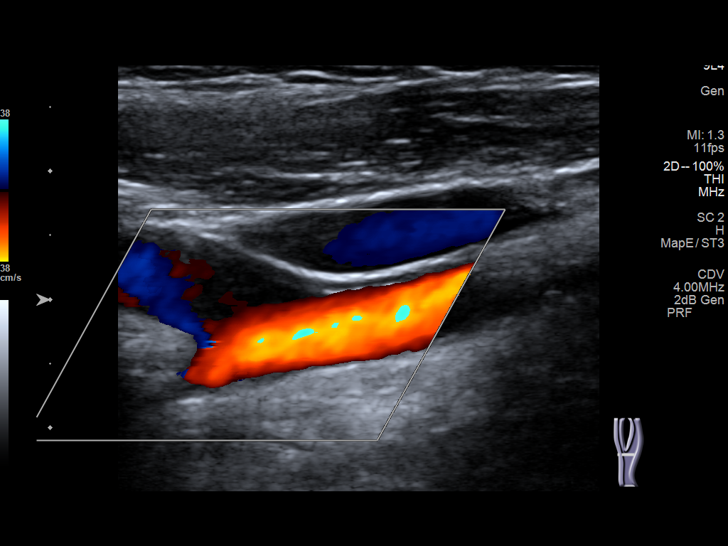
[im 57/69]
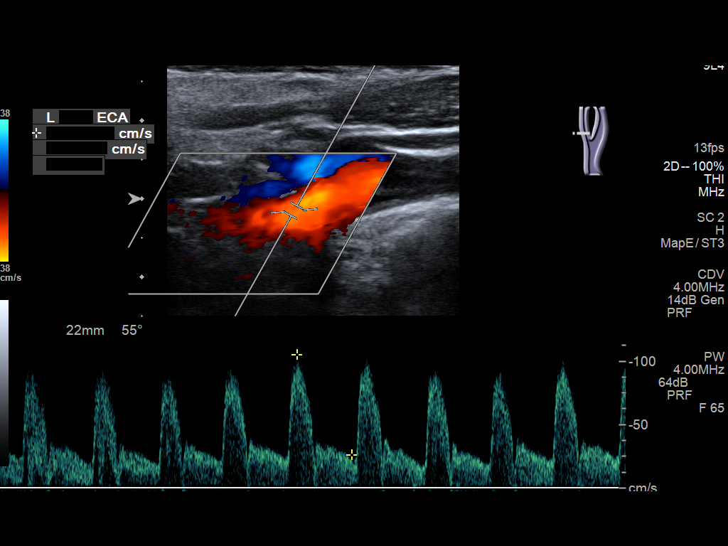
[im 63/69]
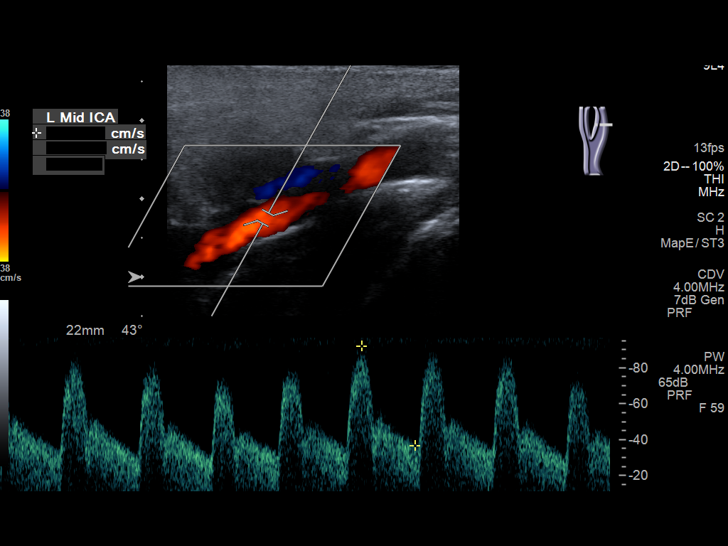
[im 69/69]
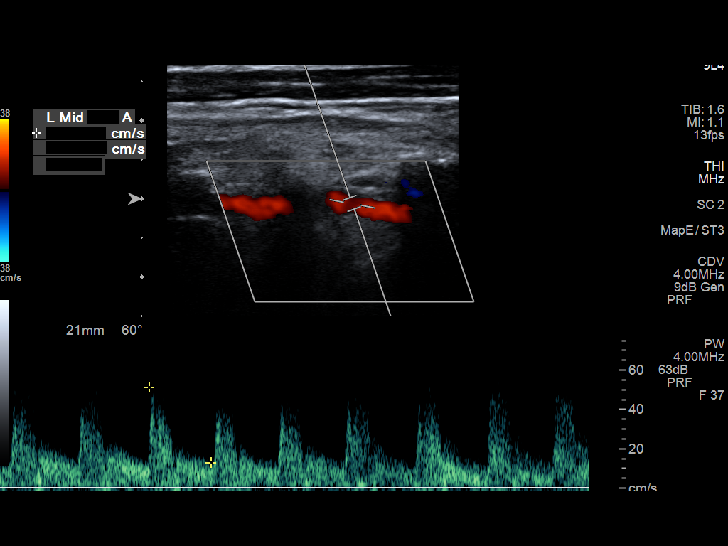

[13 of 24 positions shown; findings below may reference images not displayed]

FINDINGS: Criteria: Quantification of carotid stenosis is based on velocity
parameters that correlate the residual internal carotid diameter
with NASCET-based stenosis levels, using the diameter of the distal
internal carotid lumen as the denominator for stenosis measurement.

The following velocity measurements were obtained:

RIGHT

ICA:  Systolic 106 cm/sec, Diastolic 41 cm/sec

CCA:  105 cm/sec

SYSTOLIC ICA/CCA RATIO:

ECA:  96 cm/sec

LEFT

ICA:  Systolic 92 cm/sec, Diastolic 37 cm/sec

CCA:  114 cm/sec

SYSTOLIC ICA/CCA RATIO:

ECA:  105 cm/sec

Right Brachial SBP: Not acquired

Left Brachial SBP: Not acquired

RIGHT CAROTID ARTERY: No significant calcifications of the right
common carotid artery. Intermediate waveform maintained.
Heterogeneous and partially calcified plaque at the right carotid
bifurcation. No significant lumen shadowing. Low resistance waveform
of the right ICA. No significant tortuosity.

RIGHT VERTEBRAL ARTERY: Antegrade flow with low resistance waveform.

LEFT CAROTID ARTERY: No significant calcifications of the left
common carotid artery. Intermediate waveform maintained.
Heterogeneous and partially calcified plaque at the left carotid
bifurcation without significant lumen shadowing. Low resistance
waveform of the left ICA. No significant tortuosity.

LEFT VERTEBRAL ARTERY:  Antegrade flow with low resistance waveform.
IMPRESSION: Color duplex indicates minimal heterogeneous and calcified plaque,
with no hemodynamically significant stenosis by duplex criteria in
the extracranial cerebrovascular circulation.

## 2021-05-15 IMAGING — CT CT HEART MORP W/ CTA COR W/ SCORE W/ CA W/CM &/OR W/O CM
4 of 7 series · 8 of 20 positions shown, 9 images · IV contrast (omnipaque)
Comparison: None.
COMPARISON: None.

Addendum:
EXAM:
OVER-READ INTERPRETATION  CT CHEST

The following report is an over-read performed by radiologist Dr.
Berline Moss [REDACTED] on 06/15/2019. This
over-read does not include interpretation of cardiac or coronary
anatomy or pathology. The coronary calcium score/coronary CTA
interpretation by the cardiologist is attached.
HISTORY: Chest pain, normal ekg chest pain
Cardiac/Coronary CT
TECHNIQUE: The patient was scanned on a Siemens Force scanner.
PROTOCOL: A 120 kV prospective scan was triggered in the descending thoracic
aorta at 111 HU's. Axial non-contrast 3 mm slices were carried out
through the heart. The data set was analyzed on a dedicated work
station and scored using the Agatson method. Gantry rotation speed
was 250 msecs and collimation was 0.6 mm. Beta blockade and 0.8 mg
of sl NTG was given. The 3D data set was reconstructed in 5%
intervals of 35-75% of the R-R cycle. Diastolic phases were analyzed
on a dedicated work station using MPR, MIP and VRT modes. The
patient received 80mL OMNIPAQUE IOHEXOL 350 MG/ML SOLN of contrast.
Bolus timing allowed for coronary opacification but was primarily
concentrated in pulmonary arteries at scan time.

[Series 6: best diast 77 % · axial · 0.35mm/px · z∈[-169,-130]mm · 2 of 298 slices shown, 3 images]
[im 100/298  vessel]
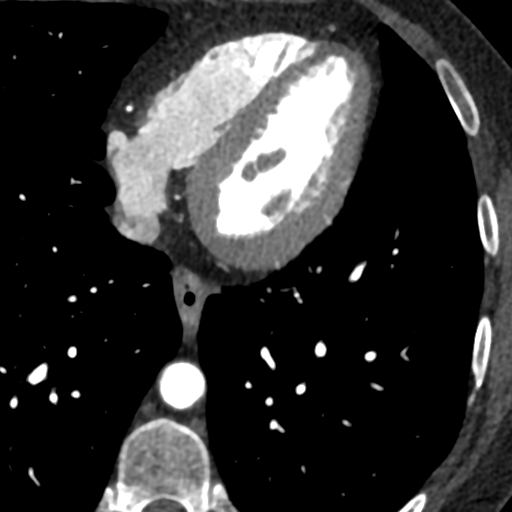
[im 100/298  lung]
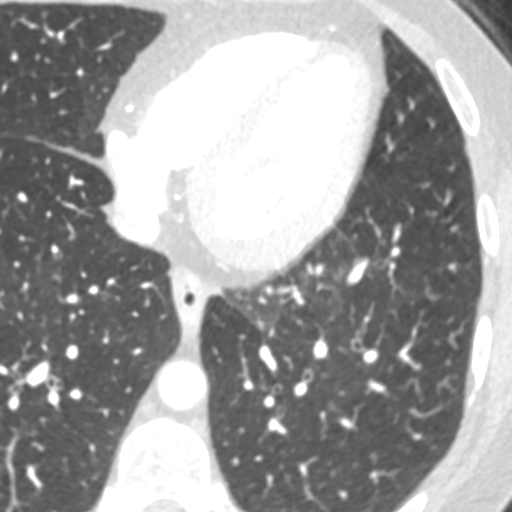
[im 199/298  vessel]
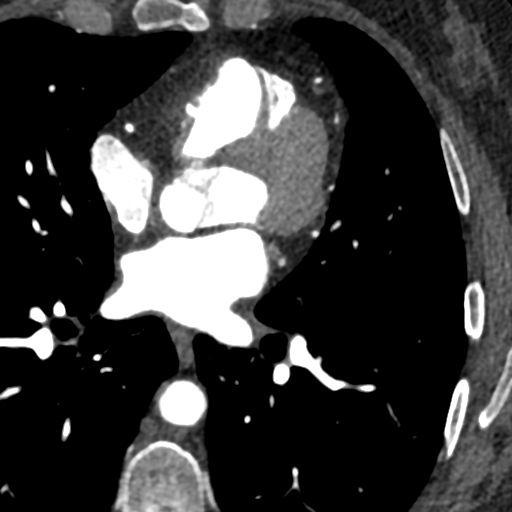

[Series 7: best syst 77 % · axial · 0.35mm/px · z∈[-169,-130]mm · 2 of 298 slices shown]
[im 100/298  vessel]
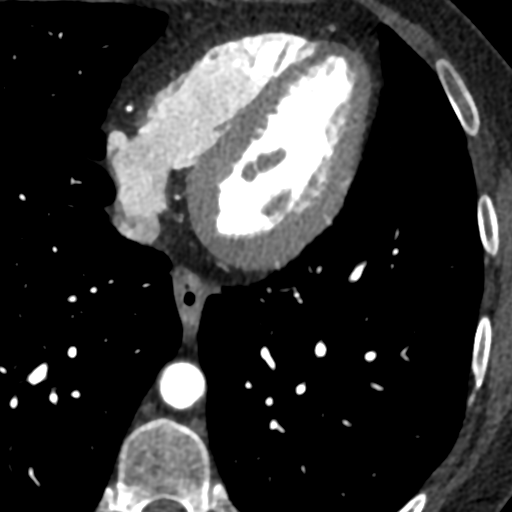
[im 199/298  vessel]
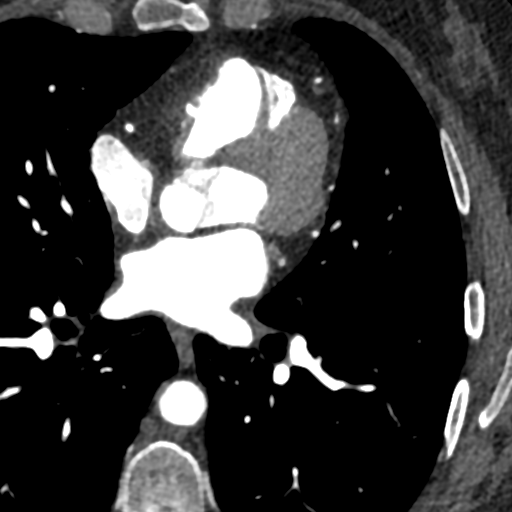

[Series 8: ts diast sharp 77 % · axial · 0.35mm/px · z∈[-169,-130]mm · 2 of 298 slices shown]
[im 100/298  lung]
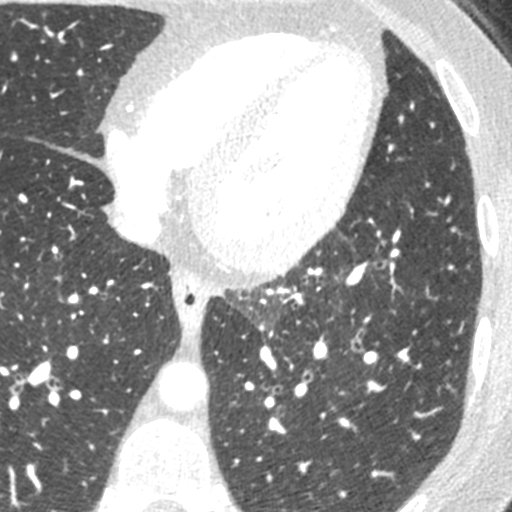
[im 199/298  lung]
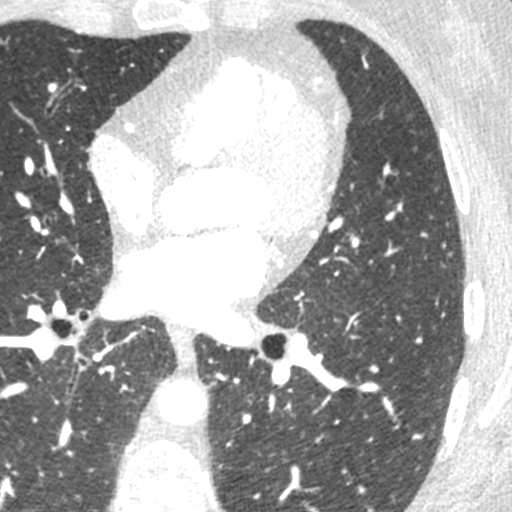

[Series 9: ts syst sharp 77 % · axial · 0.35mm/px · z∈[-169,-130]mm · 2 of 298 slices shown]
[im 100/298  lung]
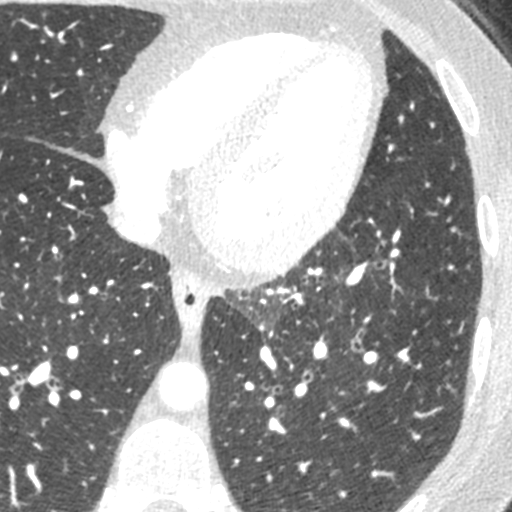
[im 199/298  lung]
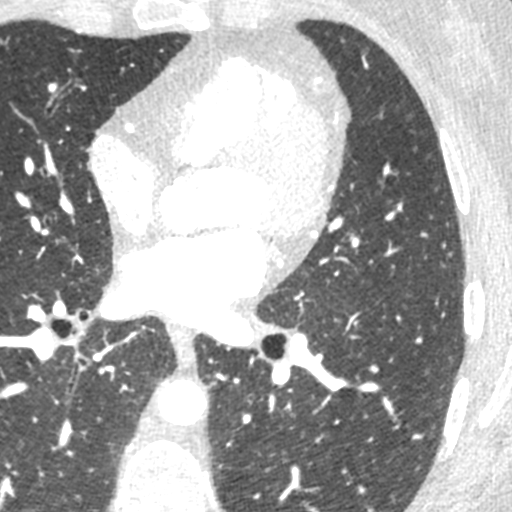

[8 of 20 positions shown; findings below may reference images not displayed]

FINDINGS: Within the visualized portions of the thorax there are no suspicious
appearing pulmonary nodules or masses, there is no acute
consolidative airspace disease, no pleural effusions, no
pneumothorax and no lymphadenopathy. Visualized portions of the
upper abdomen are unremarkable. There are no aggressive appearing
lytic or blastic lesions noted in the visualized portions of the
skeleton.
IMPRESSION: No significant incidental noncardiac findings are noted.
FINDINGS: Coronary calcium score: The patient's coronary artery calcium score
is 0, which places the patient in the 0 percentile.

Coronary arteries: Normal coronary origins.  Right dominance.

Right Coronary Artery: Medium caliber vessel that give rise to PLB
and PDA. No significant plaque or stenosis.

Left Main Coronary Artery: No significant plaque or stenosis.

Left Anterior Descending Coronary Artery: Gives rise to two medium
sized diagonals. No significant plaque or stenosis in LAD or branch
vessels.

Left Circumflex Artery: Gives rise to two large OM branches. No
significant plaque or stenosis in LCx or branch vessels.

Aorta: Normal size, 27 mm at the mid ascending aorta (level of the
PA bifurcation) measured double oblique. No calcifications. No
dissection.

Aortic Valve: No calcifications. Trileaflet.

Other findings:

Normal pulmonary vein drainage into the left atrium.

Normal left atrial appendage without a thrombus.

Normal size of the pulmonary artery.
IMPRESSION: 1. No evidence of CAD, CADRADS = 0.

2. Coronary calcium score of 0. This was 0 percentile for age and
sex matched control.

3. Normal coronary origin with right dominance.

*** End of Addendum ***
EXAM:
OVER-READ INTERPRETATION  CT CHEST

The following report is an over-read performed by radiologist Dr.
Berline Moss [REDACTED] on 06/15/2019. This
over-read does not include interpretation of cardiac or coronary
anatomy or pathology. The coronary calcium score/coronary CTA
interpretation by the cardiologist is attached.
FINDINGS: Within the visualized portions of the thorax there are no suspicious
appearing pulmonary nodules or masses, there is no acute
consolidative airspace disease, no pleural effusions, no
pneumothorax and no lymphadenopathy. Visualized portions of the
upper abdomen are unremarkable. There are no aggressive appearing
lytic or blastic lesions noted in the visualized portions of the
skeleton.
IMPRESSION: No significant incidental noncardiac findings are noted.

## 2021-05-21 ENCOUNTER — Encounter (HOSPITAL_BASED_OUTPATIENT_CLINIC_OR_DEPARTMENT_OTHER): Payer: Self-pay | Admitting: Cardiology

## 2021-05-21 ENCOUNTER — Other Ambulatory Visit: Payer: Self-pay

## 2021-05-21 ENCOUNTER — Ambulatory Visit (INDEPENDENT_AMBULATORY_CARE_PROVIDER_SITE_OTHER): Payer: BC Managed Care – PPO | Admitting: Cardiology

## 2021-05-21 VITALS — BP 134/74 | HR 82 | Ht 62.0 in | Wt 151.2 lb

## 2021-05-21 DIAGNOSIS — R002 Palpitations: Secondary | ICD-10-CM | POA: Diagnosis not present

## 2021-05-21 DIAGNOSIS — I10 Essential (primary) hypertension: Secondary | ICD-10-CM

## 2021-05-21 DIAGNOSIS — I471 Supraventricular tachycardia: Secondary | ICD-10-CM

## 2021-05-21 DIAGNOSIS — Z716 Tobacco abuse counseling: Secondary | ICD-10-CM | POA: Diagnosis not present

## 2021-05-21 DIAGNOSIS — R42 Dizziness and giddiness: Secondary | ICD-10-CM

## 2021-05-21 DIAGNOSIS — Z7189 Other specified counseling: Secondary | ICD-10-CM

## 2021-05-21 MED ORDER — NEBIVOLOL HCL 10 MG PO TABS: 10.0000 mg | ORAL_TABLET | Freq: Every day | ORAL | 3 refills | Status: DC

## 2021-05-21 NOTE — Progress Notes (Signed)
Cardiology Office Note:    Date:  05/21/2021   ID:  [name], DOB March 16, 1966, MRN [id number]  PCP:  Celene Squibb, MD  Cardiologist:  Buford Dresser, MD PhD  Referring MD: Celene Squibb, MD   CC: follow up  History of Present Illness:    [name] is a 55 y.o. female with a hx of tobacco use, hypertension who is seen for follow up today. I initially met her 05/25/2019 as a new consult at the request of Celene Squibb, MD for the evaluation and management of dizziness, palpitations, left arm/chest tightness.  Today: For the last 2-3 mos, her blood pressure has been dropping 87-86 points systolic and rising diastolic. Feels flushed, lightheaded when this occurs. No syncope. Worse when she is out in the heat. Feels like she has to sit down for 1-1.5 hours after.  Taking both edarbi and bystolic at night. No longer taking amlodipine, stopped 3 weeks ago. Has not made a difference. Tried to cut her bystolic dose, but her palpitations became much worse. Taking a full pill daily.  Has been on multiple medications in the past. Was on losartan, clonidine in the remote past. Amlodipine just recently stopped, was on 5 mg peak dose.   Uses omron arm cuff, new.   Still smoking, but has cut back. Less than 1 ppd now, peak 1.5 ppd.  Denies chest pain, shortness of breath at rest or with normal exertion. No PND, orthopnea, LE edema or unexpected weight gain. No syncope or palpitations.    Past Medical History:  Diagnosis Date   Asthma    COPD (chronic obstructive pulmonary disease) ([place])    Depression    Seasonal allergies     Past Surgical History:  Procedure Laterality Date   ABDOMINAL HYSTERECTOMY     CHOLECYSTECTOMY     COLONOSCOPY  05/21/2011   Procedure: COLONOSCOPY;  Surgeon: Rogene Houston, MD;  Location: [facility];  Service: Endoscopy;  Laterality: N/A;  12:30   ESOPHAGOGASTRODUODENOSCOPY     TUBAL LIGATION      Current Medications: Current Outpatient  Medications on File Prior to Visit  Medication Sig   acetaminophen (TYLENOL) 500 MG tablet Take 1,000 mg by mouth every 6 (six) hours as needed for pain.   albuterol (PROVENTIL HFA;VENTOLIN HFA) 108 (90 BASE) MCG/ACT inhaler Inhale 2 puffs into the lungs every 6 (six) hours as needed for shortness of breath.    ALPRAZolam (XANAX) 0.5 MG tablet Take 0.5 mg by mouth 2 (two) times daily as needed.   Azilsartan Medoxomil (EDARBI) 80 MG TABS    EDARBI 80 MG TABS Take 1 tablet by mouth daily.   VRAYLAR 1.5 MG capsule Take 1.5 mg by mouth daily.   No current facility-administered medications on file prior to visit.     Allergies:   Bee venom, Codeine, Penicillins, Prilosec [omeprazole], and Zantac [ranitidine]   Social History   Tobacco Use   Smoking status: Every Day    Packs/day: 1.00    Years: 23.00    Pack years: 23.00    Types: Cigarettes   Smokeless tobacco: Never  Vaping Use   Vaping Use: Never used  Substance Use Topics   Alcohol use: No   Drug use: No      Family History: The patient's family history includes Colon cancer in an other family member. mother had a stroke at age 64, 5 had CHF, [name] had heart and blood pressure problems. Everyone  else has cancer.  ROS:   Please see the history of present illness.  Additional pertinent ROS otherwise unremarkable.   EKGs/Labs/Other Studies Reviewed:    The following studies were reviewed today: CT coronary 06/16/2019 FINDINGS: Coronary calcium score: The patient's coronary artery calcium score is 0, which places the patient in the 0 percentile.   Coronary arteries: Normal coronary origins.  Right dominance.   Right Coronary Artery: Medium caliber vessel that give rise to PLB and PDA. No significant plaque or stenosis.   Left Main Coronary Artery: No significant plaque or stenosis.   Left Anterior Descending Coronary Artery: Gives rise to two medium sized diagonals. No significant plaque or stenosis in LAD or  branch vessels.   Left Circumflex Artery: Gives rise to two large OM branches. No significant plaque or stenosis in LCx or branch vessels.   Aorta: Normal size, 27 mm at the mid ascending aorta (level of the PA bifurcation) measured double oblique. No calcifications. No dissection.   Aortic Valve: No calcifications. Trileaflet.   Other findings:  Normal pulmonary vein drainage into the left atrium.  Normal left atrial appendage without a thrombus.  Normal size of the pulmonary artery.   IMPRESSION: 1. No evidence of CAD, CADRADS = 0.  2. Coronary calcium score of 0. This was 0 percentile for age and sex matched control.  3. Normal coronary origin with right dominance.  Event monitor 08/30/2019 12 days of data recorded on Zio monitor. Patient had a min HR of 58 bpm, max HR of 184 bpm, and avg HR of 82 bpm. Predominant underlying rhythm was Sinus Rhythm. No VT, atrial fibrillation, high degree block, or pauses noted. Isolated atrial and ventricular ectopy was rare (<1%). There were 19 triggered events. There were 6 SVT events noted, all very brief (longest 5 beats). These were detected near patient triggered event markers.  Carotid u/s 06/07/2019 RIGHT CAROTID ARTERY: No significant calcifications of the right common carotid artery. Intermediate waveform maintained. Heterogeneous and partially calcified plaque at the right carotid bifurcation. No significant lumen shadowing. Low resistance waveform of the right ICA. No significant tortuosity.   RIGHT VERTEBRAL ARTERY: Antegrade flow with low resistance waveform.   LEFT CAROTID ARTERY: No significant calcifications of the left common carotid artery. Intermediate waveform maintained. Heterogeneous and partially calcified plaque at the left carotid bifurcation without significant lumen shadowing. Low resistance waveform of the left ICA. No significant tortuosity.   LEFT VERTEBRAL ARTERY:  Antegrade flow with low resistance  waveform.   IMPRESSION: Color duplex indicates minimal heterogeneous and calcified plaque, with no hemodynamically significant stenosis by duplex criteria in the extracranial cerebrovascular circulation.  EKG:  EKG is personally reviewed.   03/07/21 NSR 81 bpm  Recent Labs: 03/07/2021: BUN 12; Creatinine, Ser 0.88; Hemoglobin 16.3; Platelets 183; Potassium 3.4; Sodium 139  Recent Lipid Panel No results found for: CHOL, TRIG, HDL, CHOLHDL, VLDL, LDLCALC, LDLDIRECT  Physical Exam:    VS:  BP 134/74   Pulse 82   Ht '5\' 2"'  (1.575 m)   Wt 151 lb 3.2 oz (68.6 kg)   SpO2 92%   BMI 27.65 kg/m    Orthostatics: Lying 141/78, HR 72 Sitting 142/84, HR 77 felt dizzy Standing 137/96, HR 78 Standing 3 min 151/96, HR 79 head a headache  Wt Readings from Last 3 Encounters:  05/21/21 151 lb 3.2 oz (68.6 kg)  04/16/21 149 lb 12.8 oz (67.9 kg)  03/07/21 152 lb (68.9 kg)    GEN: Well nourished, well developed  in no acute distress HEENT: Normal, moist mucous membranes NECK: No JVD CARDIAC: regular rhythm, normal S1 and S2, no rubs or gallops. No murmur. VASCULAR: Radial and DP pulses 2+ bilaterally. No carotid bruits RESPIRATORY:  Clear to auscultation without rales, wheezing or rhonchi  ABDOMEN: Soft, non-tender, non-distended MUSCULOSKELETAL:  Ambulates independently SKIN: Warm and dry, no edema NEUROLOGIC:  Alert and oriented x 3. No focal neuro deficits noted. PSYCHIATRIC:  Normal affect    ASSESSMENT:    1. Essential hypertension   2. Palpitation   3. Paroxysmal SVT (supraventricular tachycardia) ([place])   4. Tobacco abuse counseling   5. Cardiac risk counseling   6. Counseling on health promotion and disease prevention   7. Orthostatic lightheadedness     PLAN:    Palpitations:  -managed on bystolic current dose -see monitor as above  Labile hypertension Concern for orthostatic hypotension, with orthostatic lightheadedness -on orthostatics today, BP actually rose both  systolic and diastolic -recently stopped amlodipine without improvement -currently taking azilsartan 80 mg at bedtime and nebivolol 10 mg at bedtime -has had good control in the past with intermittent spikes -has been on losartan, clonidine in the past -will get 24 hour ambulatory blood pressure monitor for further evaulation -reviewed recommendations for how to check BP, she is following this except for when she checks it standing, she is holding her arm out.  Tobacco use counseling: The patient was counseled on tobacco cessation today for 4 minutes.  Counseling included reviewing the risks of smoking tobacco products, how it impacts the patient's current medical diagnoses and different strategies for quitting.  Pharmacotherapy to aid in tobacco cessation was not prescribed today.   Cardiac risk counseling and prevention recommendations: -recommend heart healthy/Mediterranean diet, with whole grains, fruits, vegetable, fish, lean meats, nuts, and olive oil. Limit salt. -recommend moderate walking, 3-5 times/week for 30-50 minutes each session. Aim for at least 150 minutes.week. Goal should be pace of 3 miles/hours, or walking 1.5 miles in 30 minutes -recommend avoidance of tobacco products. Avoid excess alcohol.  Plan for follow up: 2 mos or sooner as needed  Total time of encounter: 51 minutes total time of encounter, including 41 minutes spent in face-to-face patient care. This time includes coordination of care and counseling regarding symptoms and potential etiology/management. Remainder of non-face-to-face time involved reviewing chart documents/testing relevant to the patient encounter and documentation in the medical record.  Buford Dresser, MD, PhD, [facility]    Medication Adjustments/Labs and Tests Ordered: Current medicines are reviewed at length with the patient today.  Concerns regarding medicines are outlined above.  Orders Placed This Encounter   Procedures   24 hour blood pressure monitor    Meds ordered this encounter  Medications   nebivolol (BYSTOLIC) 10 MG tablet    Sig: Take 1 tablet (10 mg total) by mouth daily.    Dispense:  90 tablet    Refill:  3    Replaces 5 mg dose, wait to fill until patient calls     Patient Instructions  Medication Instructions:  Your Physician recommend you continue on your current medication as directed.    *If you need a refill on your cardiac medications before your next appointment, please call your pharmacy*   Lab Work: None ordered today   Testing/Procedures: None ordered today   Follow-Up: At [facility], you and your health needs are our priority.  As part of our continuing mission to provide you with exceptional heart care, we  have created designated Provider Care Teams.  These Care Teams include your primary Cardiologist (physician) and Advanced Practice Providers (APPs -  Physician Assistants and Nurse Practitioners) who all work together to provide you with the care you need, when you need it.  We recommend signing up for the patient portal called "MyChart".  Sign up information is provided on this After Visit Summary.  MyChart is used to connect with patients for Virtual Visits (Telemedicine).  Patients are able to view lab/test results, encounter notes, upcoming appointments, etc.  Non-urgent messages can be sent to your provider as well.   To learn more about what you can do with MyChart, go to [email or web address].    Your next appointment:   2 month(s)  The format for your next appointment:   In Person  Provider:   Buford Dresser, MD     Signed, Buford Dresser, MD PhD 05/21/2021 12:59 PM    [place]

## 2021-05-21 NOTE — Patient Instructions (Signed)
Medication Instructions:  Your Physician recommend you continue on your current medication as directed.    *If you need a refill on your cardiac medications before your next appointment, please call your pharmacy*   Lab Work: None ordered today   Testing/Procedures: None ordered today   Follow-Up: At [facility], you and your health needs are our priority.  As part of our continuing mission to provide you with exceptional heart care, we have created designated Provider Care Teams.  These Care Teams include your primary Cardiologist (physician) and Advanced Practice Providers (APPs -  Physician Assistants and Nurse Practitioners) who all work together to provide you with the care you need, when you need it.  We recommend signing up for the patient portal called "MyChart".  Sign up information is provided on this After Visit Summary.  MyChart is used to connect with patients for Virtual Visits (Telemedicine).  Patients are able to view lab/test results, encounter notes, upcoming appointments, etc.  Non-urgent messages can be sent to your provider as well.   To learn more about what you can do with MyChart, go to [email or web address].    Your next appointment:   2 month(s)  The format for your next appointment:   In Person  Provider:   Bridgette Christopher, MD{      

## 2021-05-22 DIAGNOSIS — Z79899 Other long term (current) drug therapy: Secondary | ICD-10-CM | POA: Diagnosis not present

## 2021-05-22 DIAGNOSIS — I10 Essential (primary) hypertension: Secondary | ICD-10-CM | POA: Diagnosis not present

## 2021-05-22 DIAGNOSIS — R7303 Prediabetes: Secondary | ICD-10-CM | POA: Diagnosis not present

## 2021-05-29 ENCOUNTER — Other Ambulatory Visit (HOSPITAL_COMMUNITY): Payer: Self-pay | Admitting: Family Medicine

## 2021-05-29 DIAGNOSIS — F411 Generalized anxiety disorder: Secondary | ICD-10-CM | POA: Diagnosis not present

## 2021-05-29 DIAGNOSIS — R062 Wheezing: Secondary | ICD-10-CM | POA: Diagnosis not present

## 2021-05-29 DIAGNOSIS — I10 Essential (primary) hypertension: Secondary | ICD-10-CM | POA: Diagnosis not present

## 2021-05-29 DIAGNOSIS — Z1231 Encounter for screening mammogram for malignant neoplasm of breast: Secondary | ICD-10-CM

## 2021-05-29 DIAGNOSIS — R002 Palpitations: Secondary | ICD-10-CM | POA: Diagnosis not present

## 2021-05-31 ENCOUNTER — Encounter (INDEPENDENT_AMBULATORY_CARE_PROVIDER_SITE_OTHER): Payer: Self-pay | Admitting: *Deleted

## 2021-06-11 ENCOUNTER — Telehealth: Payer: Self-pay | Admitting: Cardiology

## 2021-06-11 NOTE — Telephone Encounter (Signed)
Patient called stating that Dr. Buford Dresser had put in a referral for patient to come to the [place] office for  aCAR BP MONITOR. Patient states that this one a month ago and she has not heard from anyone.

## 2021-06-15 NOTE — Telephone Encounter (Signed)
I will forward to Geanie Cooley, RN to schedule, order is in system.

## 2021-06-19 NOTE — Telephone Encounter (Signed)
I will forward to Thomes Dinning and make Dr.Christopher aware that [facility] does not do this procedure anymore.

## 2021-06-19 NOTE — Telephone Encounter (Signed)
I talked with Romie Minus about scheduling this. We stopped doing this about 2 years ago. Let me know if you have any questions.

## 2021-06-20 NOTE — Telephone Encounter (Signed)
Explained to patient blood pressure monitor not done at [facility] or [facility].   Scheduled fot 24 hour ambulatory blood pressure monitor at [facility] office, Monday , 07/09/21 at 8:30AM.  Patient is aware monitor needs to be returned to office that same week.

## 2021-07-02 DIAGNOSIS — R509 Fever, unspecified: Secondary | ICD-10-CM | POA: Diagnosis not present

## 2021-07-02 DIAGNOSIS — H9203 Otalgia, bilateral: Secondary | ICD-10-CM | POA: Diagnosis not present

## 2021-07-02 DIAGNOSIS — J069 Acute upper respiratory infection, unspecified: Secondary | ICD-10-CM | POA: Diagnosis not present

## 2021-07-02 DIAGNOSIS — R0981 Nasal congestion: Secondary | ICD-10-CM | POA: Diagnosis not present

## 2021-07-09 ENCOUNTER — Ambulatory Visit (INDEPENDENT_AMBULATORY_CARE_PROVIDER_SITE_OTHER): Payer: BC Managed Care – PPO

## 2021-07-09 ENCOUNTER — Other Ambulatory Visit: Payer: Self-pay

## 2021-07-09 ENCOUNTER — Other Ambulatory Visit (HOSPITAL_BASED_OUTPATIENT_CLINIC_OR_DEPARTMENT_OTHER): Payer: Self-pay | Admitting: Cardiology

## 2021-07-09 DIAGNOSIS — R42 Dizziness and giddiness: Secondary | ICD-10-CM

## 2021-07-09 DIAGNOSIS — R0989 Other specified symptoms and signs involving the circulatory and respiratory systems: Secondary | ICD-10-CM | POA: Diagnosis not present

## 2021-07-09 DIAGNOSIS — R030 Elevated blood-pressure reading, without diagnosis of hypertension: Secondary | ICD-10-CM

## 2021-07-09 DIAGNOSIS — I10 Essential (primary) hypertension: Secondary | ICD-10-CM

## 2021-07-09 NOTE — Progress Notes (Unsigned)
24 Hour ambulatory blood pressure monitor applied using standard adult cuff.

## 2021-07-24 DIAGNOSIS — S2020XA Contusion of thorax, unspecified, initial encounter: Secondary | ICD-10-CM | POA: Diagnosis not present

## 2021-07-24 DIAGNOSIS — S6992XA Unspecified injury of left wrist, hand and finger(s), initial encounter: Secondary | ICD-10-CM | POA: Diagnosis not present

## 2021-07-28 DIAGNOSIS — R059 Cough, unspecified: Secondary | ICD-10-CM | POA: Diagnosis not present

## 2021-07-28 DIAGNOSIS — R112 Nausea with vomiting, unspecified: Secondary | ICD-10-CM | POA: Diagnosis not present

## 2021-07-28 DIAGNOSIS — U071 COVID-19: Secondary | ICD-10-CM | POA: Diagnosis not present

## 2021-07-28 DIAGNOSIS — R0981 Nasal congestion: Secondary | ICD-10-CM | POA: Diagnosis not present

## 2021-07-31 ENCOUNTER — Ambulatory Visit (HOSPITAL_BASED_OUTPATIENT_CLINIC_OR_DEPARTMENT_OTHER): Payer: BC Managed Care – PPO | Admitting: Cardiology

## 2021-08-28 DIAGNOSIS — R062 Wheezing: Secondary | ICD-10-CM | POA: Diagnosis not present

## 2021-08-31 ENCOUNTER — Ambulatory Visit (HOSPITAL_BASED_OUTPATIENT_CLINIC_OR_DEPARTMENT_OTHER): Payer: Self-pay | Admitting: Cardiology

## 2021-09-03 DIAGNOSIS — M791 Myalgia, unspecified site: Secondary | ICD-10-CM | POA: Diagnosis not present

## 2021-09-03 DIAGNOSIS — R112 Nausea with vomiting, unspecified: Secondary | ICD-10-CM | POA: Diagnosis not present

## 2021-09-03 DIAGNOSIS — R509 Fever, unspecified: Secondary | ICD-10-CM | POA: Diagnosis not present

## 2021-09-03 DIAGNOSIS — R059 Cough, unspecified: Secondary | ICD-10-CM | POA: Diagnosis not present

## 2021-10-22 ENCOUNTER — Ambulatory Visit (HOSPITAL_BASED_OUTPATIENT_CLINIC_OR_DEPARTMENT_OTHER): Payer: BC Managed Care – PPO | Admitting: Cardiology

## 2021-10-26 DIAGNOSIS — I10 Essential (primary) hypertension: Secondary | ICD-10-CM | POA: Diagnosis not present

## 2021-10-29 DIAGNOSIS — I10 Essential (primary) hypertension: Secondary | ICD-10-CM | POA: Diagnosis not present

## 2021-11-21 ENCOUNTER — Encounter (INDEPENDENT_AMBULATORY_CARE_PROVIDER_SITE_OTHER): Payer: Self-pay | Admitting: *Deleted

## 2021-11-28 DIAGNOSIS — I10 Essential (primary) hypertension: Secondary | ICD-10-CM | POA: Diagnosis not present

## 2021-11-28 DIAGNOSIS — R7303 Prediabetes: Secondary | ICD-10-CM | POA: Diagnosis not present

## 2021-12-05 DIAGNOSIS — Z0001 Encounter for general adult medical examination with abnormal findings: Secondary | ICD-10-CM | POA: Diagnosis not present

## 2022-01-01 ENCOUNTER — Encounter (HOSPITAL_COMMUNITY): Payer: Self-pay

## 2022-01-01 ENCOUNTER — Emergency Department (HOSPITAL_COMMUNITY): Payer: BC Managed Care – PPO

## 2022-01-01 ENCOUNTER — Observation Stay (HOSPITAL_COMMUNITY): Payer: BC Managed Care – PPO

## 2022-01-01 ENCOUNTER — Observation Stay (HOSPITAL_COMMUNITY)
Admission: EM | Admit: 2022-01-01 | Discharge: 2022-01-02 | Disposition: A | Discharge status: Home / Self Care | Payer: BC Managed Care – PPO | Attending: Internal Medicine | Admitting: Internal Medicine

## 2022-01-01 ENCOUNTER — Other Ambulatory Visit: Payer: Self-pay

## 2022-01-01 DIAGNOSIS — R29818 Other symptoms and signs involving the nervous system: Secondary | ICD-10-CM | POA: Diagnosis not present

## 2022-01-01 DIAGNOSIS — J449 Chronic obstructive pulmonary disease, unspecified: Secondary | ICD-10-CM | POA: Diagnosis not present

## 2022-01-01 DIAGNOSIS — R42 Dizziness and giddiness: Secondary | ICD-10-CM | POA: Diagnosis not present

## 2022-01-01 DIAGNOSIS — R29810 Facial weakness: Secondary | ICD-10-CM

## 2022-01-01 DIAGNOSIS — F17210 Nicotine dependence, cigarettes, uncomplicated: Secondary | ICD-10-CM | POA: Insufficient documentation

## 2022-01-01 DIAGNOSIS — G459 Transient cerebral ischemic attack, unspecified: Secondary | ICD-10-CM | POA: Diagnosis not present

## 2022-01-01 DIAGNOSIS — R531 Weakness: Secondary | ICD-10-CM | POA: Diagnosis not present

## 2022-01-01 DIAGNOSIS — R079 Chest pain, unspecified: Secondary | ICD-10-CM | POA: Diagnosis not present

## 2022-01-01 DIAGNOSIS — J45909 Unspecified asthma, uncomplicated: Secondary | ICD-10-CM | POA: Diagnosis not present

## 2022-01-01 DIAGNOSIS — K219 Gastro-esophageal reflux disease without esophagitis: Secondary | ICD-10-CM

## 2022-01-01 DIAGNOSIS — I471 Supraventricular tachycardia, unspecified: Secondary | ICD-10-CM | POA: Diagnosis present

## 2022-01-01 DIAGNOSIS — R569 Unspecified convulsions: Secondary | ICD-10-CM | POA: Diagnosis not present

## 2022-01-01 DIAGNOSIS — R61 Generalized hyperhidrosis: Secondary | ICD-10-CM

## 2022-01-01 DIAGNOSIS — Z79899 Other long term (current) drug therapy: Secondary | ICD-10-CM | POA: Diagnosis not present

## 2022-01-01 DIAGNOSIS — I10 Essential (primary) hypertension: Secondary | ICD-10-CM | POA: Diagnosis not present

## 2022-01-01 DIAGNOSIS — R202 Paresthesia of skin: Secondary | ICD-10-CM

## 2022-01-01 DIAGNOSIS — R519 Headache, unspecified: Secondary | ICD-10-CM | POA: Diagnosis not present

## 2022-01-01 DIAGNOSIS — I161 Hypertensive emergency: Secondary | ICD-10-CM | POA: Diagnosis not present

## 2022-01-01 DIAGNOSIS — Z20822 Contact with and (suspected) exposure to covid-19: Secondary | ICD-10-CM | POA: Diagnosis not present

## 2022-01-01 DIAGNOSIS — M47812 Spondylosis without myelopathy or radiculopathy, cervical region: Secondary | ICD-10-CM | POA: Diagnosis not present

## 2022-01-01 DIAGNOSIS — R000 Tachycardia, unspecified: Secondary | ICD-10-CM | POA: Diagnosis not present

## 2022-01-01 DIAGNOSIS — Z716 Tobacco abuse counseling: Secondary | ICD-10-CM

## 2022-01-01 DIAGNOSIS — R200 Anesthesia of skin: Secondary | ICD-10-CM | POA: Diagnosis not present

## 2022-01-01 DIAGNOSIS — R29898 Other symptoms and signs involving the musculoskeletal system: Secondary | ICD-10-CM

## 2022-01-01 LAB — DIFFERENTIAL
Abs Immature Granulocytes: 0.02 10*3/uL (ref 0.00–0.07)
Basophils Absolute: 0.1 10*3/uL (ref 0.0–0.1)
Basophils Relative: 1 %
Eosinophils Absolute: 0.1 10*3/uL (ref 0.0–0.5)
Eosinophils Relative: 2 %
Immature Granulocytes: 0 %
Lymphocytes Relative: 24 %
Lymphs Abs: 1.8 10*3/uL (ref 0.7–4.0)
Monocytes Absolute: 0.4 10*3/uL (ref 0.1–1.0)
Monocytes Relative: 6 %
Neutro Abs: 5.1 10*3/uL (ref 1.7–7.7)
Neutrophils Relative %: 67 %

## 2022-01-01 LAB — RAPID URINE DRUG SCREEN, HOSP PERFORMED
Amphetamines: NOT DETECTED
Barbiturates: NOT DETECTED
Benzodiazepines: POSITIVE — AB
Cocaine: NOT DETECTED
Opiates: NOT DETECTED
Tetrahydrocannabinol: NOT DETECTED

## 2022-01-01 LAB — URINALYSIS, ROUTINE W REFLEX MICROSCOPIC
Bilirubin Urine: NEGATIVE
Glucose, UA: NEGATIVE mg/dL
Ketones, ur: NEGATIVE mg/dL
Leukocytes,Ua: NEGATIVE
Nitrite: NEGATIVE
Protein, ur: NEGATIVE mg/dL
Specific Gravity, Urine: 1.004 — ABNORMAL LOW (ref 1.005–1.030)
pH: 6 (ref 5.0–8.0)

## 2022-01-01 LAB — CBC
HCT: 44.7 % (ref 36.0–46.0)
Hemoglobin: 14.8 g/dL (ref 12.0–15.0)
MCH: 32.7 pg (ref 26.0–34.0)
MCHC: 33.1 g/dL (ref 30.0–36.0)
MCV: 98.7 fL (ref 80.0–100.0)
Platelets: 168 10*3/uL (ref 150–400)
RBC: 4.53 MIL/uL (ref 3.87–5.11)
RDW: 12.8 % (ref 11.5–15.5)
WBC: 7.8 10*3/uL (ref 4.0–10.5)
nRBC: 0 % (ref 0.0–0.2)

## 2022-01-01 LAB — PROTIME-INR
INR: 0.9 (ref 0.8–1.2)
Prothrombin Time: 12 seconds (ref 11.4–15.2)

## 2022-01-01 LAB — BASIC METABOLIC PANEL
Anion gap: 10 (ref 5–15)
BUN: 17 mg/dL (ref 6–20)
CO2: 26 mmol/L (ref 22–32)
Calcium: 8.9 mg/dL (ref 8.9–10.3)
Chloride: 103 mmol/L (ref 98–111)
Creatinine, Ser: 0.8 mg/dL (ref 0.44–1.00)
GFR, Estimated: >60 mL/min (ref >60)
Glucose, Bld: 98 mg/dL (ref 70–99)
Potassium: 3.7 mmol/L (ref 3.5–5.1)
Sodium: 139 mmol/L (ref 135–145)

## 2022-01-01 LAB — RESP PANEL BY RT-PCR (FLU A&B, COVID) ARPGX2
Influenza A by PCR: NEGATIVE
Influenza B by PCR: NEGATIVE
SARS Coronavirus 2 by RT PCR: NEGATIVE

## 2022-01-01 LAB — I-STAT CHEM 8, ED
BUN: 16 mg/dL (ref 6–20)
Calcium, Ion: 1.05 mmol/L — ABNORMAL LOW (ref 1.15–1.40)
Chloride: 103 mmol/L (ref 98–111)
Creatinine, Ser: 0.8 mg/dL (ref 0.44–1.00)
Glucose, Bld: 95 mg/dL (ref 70–99)
HCT: 44 % (ref 36.0–46.0)
Hemoglobin: 15 g/dL (ref 12.0–15.0)
Potassium: 3.9 mmol/L (ref 3.5–5.1)
Sodium: 138 mmol/L (ref 135–145)
TCO2: 28 mmol/L (ref 22–32)

## 2022-01-01 LAB — CBG MONITORING, ED
Glucose-Capillary: 82 mg/dL (ref 70–99)
Glucose-Capillary: 92 mg/dL (ref 70–99)
Glucose-Capillary: 91 mg/dL (ref 70–99)

## 2022-01-01 LAB — HEMOGLOBIN A1C
Hgb A1c MFr Bld: 5.5 % (ref 4.8–5.6)
Mean Plasma Glucose: 111.15 mg/dL

## 2022-01-01 LAB — APTT: aPTT: 26 seconds (ref 24–36)

## 2022-01-01 LAB — ETHANOL: Alcohol, Ethyl (B): <10 mg/dL (ref <10)

## 2022-01-01 LAB — TSH: TSH: 0.911 u[IU]/mL (ref 0.350–4.500)

## 2022-01-01 MED ORDER — ASPIRIN 81 MG PO CHEW
81.0000 mg | CHEWABLE_TABLET | Freq: Once | ORAL | Status: AC
Start: 2022-01-01 — End: 2022-01-01
  Administered 2022-01-01: 81 mg via ORAL
  Filled 2022-01-01: qty 1

## 2022-01-01 MED ORDER — ADULT MULTIVITAMIN W/MINERALS CH
1.0000 | ORAL_TABLET | Freq: Every day | ORAL | Status: DC
  Administered 2022-01-01 – 2022-01-02 (×2): 1 via ORAL
  Filled 2022-01-01 (×2): qty 1

## 2022-01-01 MED ORDER — SODIUM CHLORIDE 0.9% FLUSH: 3.0000 mL | INTRAVENOUS | Status: DC | PRN

## 2022-01-01 MED ORDER — NICOTINE 14 MG/24HR TD PT24
14.0000 mg | MEDICATED_PATCH | Freq: Every day | TRANSDERMAL | Status: DC
  Filled 2022-01-01: qty 1

## 2022-01-01 MED ORDER — SODIUM CHLORIDE 0.9% FLUSH
3.0000 mL | Freq: Two times a day (BID) | INTRAVENOUS | Status: DC
  Administered 2022-01-01 – 2022-01-02 (×2): 3 mL via INTRAVENOUS

## 2022-01-01 MED ORDER — BISACODYL 10 MG RE SUPP: 10.0000 mg | Freq: Every day | RECTAL | Status: DC | PRN

## 2022-01-01 MED ORDER — CLOPIDOGREL BISULFATE 75 MG PO TABS
75.0000 mg | ORAL_TABLET | Freq: Once | ORAL | Status: AC
  Administered 2022-01-01: 75 mg via ORAL
  Filled 2022-01-01: qty 1

## 2022-01-01 MED ORDER — CLOPIDOGREL BISULFATE 75 MG PO TABS
75.0000 mg | ORAL_TABLET | Freq: Every day | ORAL | Status: DC
  Administered 2022-01-02: 75 mg via ORAL
  Filled 2022-01-01 (×2): qty 1

## 2022-01-01 MED ORDER — GADOBUTROL 1 MMOL/ML IV SOLN
7.0000 mL | Freq: Once | INTRAVENOUS | Status: AC | PRN
  Administered 2022-01-01: 7 mL via INTRAVENOUS

## 2022-01-01 MED ORDER — TRAZODONE HCL 50 MG PO TABS: 50.0000 mg | ORAL_TABLET | Freq: Every evening | ORAL | Status: DC | PRN

## 2022-01-01 MED ORDER — NEBIVOLOL HCL 10 MG PO TABS
10.0000 mg | ORAL_TABLET | Freq: Every day | ORAL | Status: DC
  Administered 2022-01-01 – 2022-01-02 (×2): 10 mg via ORAL
  Filled 2022-01-01 (×3): qty 1

## 2022-01-01 MED ORDER — SODIUM CHLORIDE 0.9 % IV SOLN: 250.0000 mL | INTRAVENOUS | Status: DC | PRN

## 2022-01-01 MED ORDER — ALBUTEROL SULFATE (2.5 MG/3ML) 0.083% IN NEBU: 2.5000 mg | INHALATION_SOLUTION | RESPIRATORY_TRACT | Status: DC | PRN

## 2022-01-01 MED ORDER — IRBESARTAN 150 MG PO TABS
300.0000 mg | ORAL_TABLET | Freq: Every day | ORAL | Status: DC
Start: 2022-01-02 — End: 2022-01-02
  Administered 2022-01-01 – 2022-01-02 (×2): 300 mg via ORAL
  Filled 2022-01-01 (×2): qty 2

## 2022-01-01 MED ORDER — ATORVASTATIN CALCIUM 40 MG PO TABS
40.0000 mg | ORAL_TABLET | Freq: Every day | ORAL | Status: DC
  Administered 2022-01-01: 40 mg via ORAL
  Filled 2022-01-01 (×2): qty 1

## 2022-01-01 MED ORDER — ACETAMINOPHEN 325 MG PO TABS: 650.0000 mg | ORAL_TABLET | Freq: Four times a day (QID) | ORAL | Status: DC | PRN

## 2022-01-01 MED ORDER — POLYETHYLENE GLYCOL 3350 17 G PO PACK: 17.0000 g | PACK | Freq: Every day | ORAL | Status: DC | PRN

## 2022-01-01 MED ORDER — HEPARIN SODIUM (PORCINE) 5000 UNIT/ML IJ SOLN
5000.0000 [IU] | Freq: Three times a day (TID) | INTRAMUSCULAR | Status: DC
  Administered 2022-01-01 – 2022-01-02 (×3): 5000 [IU] via SUBCUTANEOUS
  Filled 2022-01-01 (×3): qty 1

## 2022-01-01 MED ORDER — ONDANSETRON HCL 4 MG/2ML IJ SOLN: 4.0000 mg | Freq: Four times a day (QID) | INTRAMUSCULAR | Status: DC | PRN

## 2022-01-01 MED ORDER — ALPRAZOLAM 0.5 MG PO TABS
0.5000 mg | ORAL_TABLET | Freq: Three times a day (TID) | ORAL | Status: DC
  Administered 2022-01-01 – 2022-01-02 (×3): 0.5 mg via ORAL
  Filled 2022-01-01 (×3): qty 1

## 2022-01-01 MED ORDER — CARIPRAZINE HCL 1.5 MG PO CAPS
1.5000 mg | ORAL_CAPSULE | Freq: Every day | ORAL | Status: DC
  Administered 2022-01-02: 1.5 mg via ORAL
  Filled 2022-01-01 (×3): qty 1

## 2022-01-01 MED ORDER — ASPIRIN EC 81 MG PO TBEC
81.0000 mg | DELAYED_RELEASE_TABLET | Freq: Every day | ORAL | Status: DC
  Administered 2022-01-02: 81 mg via ORAL
  Filled 2022-01-01: qty 1

## 2022-01-01 MED ORDER — IOHEXOL 350 MG/ML SOLN
75.0000 mL | Freq: Once | INTRAVENOUS | Status: AC | PRN
  Administered 2022-01-01: 75 mL via INTRAVENOUS

## 2022-01-01 MED ORDER — ACETAMINOPHEN 650 MG RE SUPP: 650.0000 mg | Freq: Four times a day (QID) | RECTAL | Status: DC | PRN

## 2022-01-01 MED ORDER — ONDANSETRON HCL 4 MG PO TABS: 4.0000 mg | ORAL_TABLET | Freq: Four times a day (QID) | ORAL | Status: DC | PRN

## 2022-01-01 NOTE — Consult Note (Addendum)
NEUROLOGY TELECONSULTATION NOTE   Date of service: January 01, 2022 Patient Name: [name] MRN:  [id number] DOB:  11/10/1965 Reason for consult: telestroke L arm paresthesias  Requesting Provider: Dr. Gloris Manchester Consult Participants: myself, patient, bedside RN, telestroke RN Location of the provider: [facility] Location of the patient: [facility]  This consult was provided via telemedicine with 2-way video and audio communication. The patient/family was informed that care would be provided in this way and agreed to receive care in this manner.   _ _ _   _ __   _ __ _ _  __ __   _ __   __ _  History of Present Illness   This is a 56 year old woman with a past medical history significant for asthma, COPD, depression who presents as a telestroke for left arm paresthesias.  Last known well was 830 this morning at which time she became diaphoretic, and had tingling in her L arm>face=leg and some mild heaviness in her L arm. Heaviness has resolved and paresthesias have improved but not resolved. She checked her BP when she woke up this AM and it was 118/78. She went to work and started feeling bad in general, flushed, blurry vision, headache, racing heart, diaphoresis. At that time she rechecked her BP and it was 220/127. She typically also has sensory deficits +/- mild weakness on the left side during the events (never on the R side).   She has been having similar episodes for 4 yrs, very stereotyped, but they are increasing in frequency. Most recent before today was 4 days ago. These episodes are always associated with extreme and rapid increase in blood pressure above baseline. She rarely has chest pressure associated with these as well. Sx always resolve on their own eventually during prior episodes. She also reports occasional mild swelling of her left face/arm/leg during these events though I am not able to appreciate this over video. She has a R facial droop that husband says is new since this  AM.   She states she has never been worked up for these spells or paroxysmal BP spikes before, specifically never worked up for pheochromocytoma.  NIHSS = 2 for facial droop and sensory. CT head NAICP, unremarkable except empty sella (personal review). TNK not administered 2/2 mild and rapidly improving sx. Exam not c/w LVO therefore CTA was not performed as part of the stroke code.   ROS   Per HPI; all other systems reviewed and are negative  Past History   The following was personally reviewed:  Past Medical History:  Diagnosis Date   Asthma    COPD (chronic obstructive pulmonary disease) (HCC)    Depression    Seasonal allergies    Past Surgical History:  Procedure Laterality Date   ABDOMINAL HYSTERECTOMY     CHOLECYSTECTOMY     COLONOSCOPY  05/21/2011   Procedure: COLONOSCOPY;  Surgeon: Malissa Hippo, MD;  Location: [facility];  Service: Endoscopy;  Laterality: N/A;  12:30   ESOPHAGOGASTRODUODENOSCOPY     TUBAL LIGATION     Family History  Problem Relation Age of Onset   Colon cancer Other    Social History   Socioeconomic History   Marital status: Married    Spouse name: Not on file   Number of children: Not on file   Years of education: Not on file   Highest education level: Not on file  Occupational History   Not on file  Tobacco Use   Smoking status:  Every Day    Packs/day: 1.00    Years: 23.00    Pack years: 23.00    Types: Cigarettes   Smokeless tobacco: Never  Vaping Use   Vaping Use: Never used  Substance and Sexual Activity   Alcohol use: No   Drug use: No   Sexual activity: Yes    Birth control/protection: Surgical  Other Topics Concern   Not on file  Social History Narrative   Not on file   Social Determinants of Health   Financial Resource Strain: Not on file  Food Insecurity: Not on file  Transportation Needs: Not on file  Physical Activity: Not on file  Stress: Not on file  Social Connections: Not on file   Allergies   Allergen Reactions   Bee Venom Anaphylaxis   Codeine Nausea And Vomiting and Rash   Penicillins Nausea And Vomiting and Rash   Prilosec [Omeprazole] Swelling and Rash   Zantac [Ranitidine] Rash    Medications   (Not in a hospital admission)    No current facility-administered medications for this encounter.  Current Outpatient Medications:    acetaminophen (TYLENOL) 500 MG tablet, Take 1,000 mg by mouth every 6 (six) hours as needed for pain., Disp: , Rfl:    albuterol (PROVENTIL HFA;VENTOLIN HFA) 108 (90 BASE) MCG/ACT inhaler, Inhale 2 puffs into the lungs every 6 (six) hours as needed for shortness of breath. , Disp: , Rfl:    ALPRAZolam (XANAX) 0.5 MG tablet, Take 0.5 mg by mouth 2 (two) times daily as needed., Disp: , Rfl:    atorvastatin (LIPITOR) 20 MG tablet, Take 20 mg by mouth at bedtime., Disp: , Rfl:    Azilsartan Medoxomil (EDARBI) 80 MG TABS, , Disp: , Rfl:    EDARBI 80 MG TABS, Take 1 tablet by mouth daily., Disp: , Rfl:    nebivolol (BYSTOLIC) 10 MG tablet, Take 1 tablet (10 mg total) by mouth daily., Disp: 90 tablet, Rfl: 3   VRAYLAR 1.5 MG capsule, Take 1.5 mg by mouth daily., Disp: , Rfl:   Vitals   Vitals:   01/01/22 0955 01/01/22 1000 01/01/22 1030 01/01/22 1045  BP:  (!) 146/83 (!) 149/101 (!) 161/86  Pulse:  78 84 74  Resp:  16 18 18   Temp:   97.8 F (36.6 C)   TempSrc:   Oral   SpO2:  98% 90% 100%  Weight: 68.9 kg     Height: 5\' 2"  (1.575 m)        Body mass index is 27.8 kg/m.  Physical Exam   Exam performed over telemedicine with 2-way video and audio communication and with assistance of bedside RN  Physical Exam Gen: A&O x4, NAD Resp: normal WOB CV: extremities appear well-perfused  L side of face, arm, leg are mildly swollen, patient says this has occurred with previous events.  Neuro: *MS: A&O x4. Follows multi-step commands.  *Speech: nondysarthric, no aphasia, able to name and repeat *CN: PERRL 3mm, EOMI, VFF by confrontation,  tingling on L face, R UMN facial droop, hearing intact to voice *Motor:   Normal bulk.  No tremor, rigidity or bradykinesia. No pronator drift. All extremities appear full-strength and symmetric. *Sensory: SILT. Symmetric. No double-simultaneous extinction.  *Coordination:  Finger-to-nose, heel-to-shin, rapid alternating motions were intact. *Reflexes:  UTA 2/2 tele-exam *Gait: deferred  NIHSS  1a Level of Conscious.: 0 1b LOC Questions: 0 1c LOC Commands: 0 2 Best Gaze: 0 3 Visual: 0 4 Facial Palsy: 1 5a Motor Arm -  left: 0 5b Motor Arm - Right: 0 6a Motor Leg - Left: 0 6b Motor Leg - Right: 0 7 Limb Ataxia: 0 8 Sensory: 1 9 Best Language: 0 10 Dysarthria: 0 11 Extinct. and Inatten.: 0  TOTAL: 2   Premorbid mRS = 1   Labs   CBC:  Recent Labs  Lab 01/01/22 1035  HGB 15.0  HCT 44.0    Basic Metabolic Panel:  Lab Results  Component Value Date   NA 138 01/01/2022   K 3.9 01/01/2022   CO2 30 03/07/2021   GLUCOSE 95 01/01/2022   BUN 16 01/01/2022   CREATININE 0.80 01/01/2022   CALCIUM 9.4 03/07/2021   GFRNONAA >60 03/07/2021   GFRAA 118 06/10/2019   Lipid Panel: No results found for: LDLCALC HgbA1c: No results found for: HGBA1C Urine Drug Screen: No results found for: LABOPIA, COCAINSCRNUR, LABBENZ, AMPHETMU, THCU, LABBARB  Alcohol Level No results found for: ETH   Impression   This is a 57 year old woman with a past medical history significant for asthma, COPD, depression who presents as a telestroke for recurrent L arm > face=leg paresthesias +/- mild weakness in the setting of hypertensive emergency involving paroxysmal extreme elevation in BP with diaphoresis, headache, and tachycardia which is the classic triad for pheochromocytoma. Patient will require admission to [facility] for the following.  Recommendations   # Stroke/TIA workup - Admit to hospitalist service for stroke w/u - Permissive HTN x48 hrs from sx onset or until stroke ruled out by MRI goal  BP <180/110 (adjusted parameters 2/2 hx symptomatic hypertensive urgency). PRN labetalol or hydralazine if BP above these parameters. Consider clevidipine gtt if needed. Avoid oral antihypertensives. - MRI brain wo contrast - CTA H&N - TTE w/ bubble - Check A1c and LDL + add statin per guidelines - ASA 81mg  daily + plavix 75mg  daily x21 days f/b ASA 81mg  daily monotherapy after that - q4 hr neuro checks - STAT head CT for any change in neuro exam - Tele - PT/OT/SLP - Stroke education  # Recurrent LUE weakness and numbness # Hx tobacco abuse and COPD - MRI c spine with and without contrast - CXR rule out bony abnormalities thoracic outlet - Recommend provocative venogram to evaluate for possible thoracic outlet syndrome (order IR venogram SVC and in comments write L side c/f TOS). I discussed this with Dr. Juliette Alcide. If she is able to get this study she would require PICC placement first. She may not be able to get this study at [facility], it may have to be at [facility] or as outpatient. - If above unrevealing, may need EMG/NCS as outpatient.  # Paroxysmal extreme elevations in blood pressure associated with headache, diaphoresis, and tachycardia = classic triad for pheochromocytoma - She should be worked up for this by primary team + endocrine if needed as inpatient or outpatient - This condition may be associated with cardiomyopathy; she will already be getting TTE as part of stroke workup  She will need ambulatory referrals to neurology and endocrine at hospital discharge. If additional neurologic concerns arise during her hospitalization please place routine consult to teleneurologist Dr. Melynda Ripple.  This patient is critically ill and at significant risk of neurological worsening, death and care requires constant monitoring of vital signs, hemodynamics,respiratory and cardiac monitoring, neurological assessment, discussion with family, other specialists and medical decision making of high complexity. I  spent 95 minutes of neurocritical care time  in the care of  this patient. This was time spent independent of any  time provided by nurse practitioner or PA.  Bing Neighbors, MD [facility] [phone]  If 7pm- 7am, please page neurology on call as listed in AMION.

## 2022-01-01 NOTE — ED Triage Notes (Signed)
Patient with complaints of hypertension, headache, dizziness, diaphoretic, and tingling in left arm that started about an hour ago.  ?

## 2022-01-01 NOTE — ED Notes (Signed)
CODE STROKE PAGED 

## 2022-01-01 NOTE — ED Notes (Signed)
PA notified of pt complaining of numbness and tingling  ?

## 2022-01-01 NOTE — Progress Notes (Signed)
To CT at 1032 and returned at 1041.  ?

## 2022-01-01 NOTE — Progress Notes (Signed)
EEG unable to be done at this time as patient is in MRI. Will check back tomorrow as schedule permits. ?

## 2022-01-01 NOTE — ED Provider Notes (Signed)
?[facility] ?Provider Note ? ? ?CSN: [id number] ?Arrival date & time: 01/01/22  0944 ? ?An emergency department physician performed an initial assessment on this suspected stroke patient at 5. ? ?History ? ?Chief Complaint  ?Patient presents with  ? Hypertension  ? ? ?[name] is a 56 y.o. female. ? ?HPI ? ?Patient with medical history including hypertension, COPD, active smoker presents with chief complaint of left-sided weakness.  He states that started about an hour and half ago, states she was at work and felt all of a sudden that her left side was weak, she states that she feels that her left arm and leg and will feel numbness in her left arm.  She feels as if she is walking more to her left side which is abnormal for her.  She also notes that she is having a headache, but denies any change in vision lightheadedness dizziness nausea or vomiting.  There is been no recent head trauma, not on anticoag's.  She states that she was just started on a new cholesterol medication notes that she has some swelling in her face and as well as her legs.  She denies any tongue lip or throat swelling difficulty breathing she states it feels like an allergic reaction.  She does admit that over the weekend that she was having some intermittent left-sided numbness and weakness, states started on Saturday and will go away on its own she had again on Sunday felt fine yesterday and then started have symptoms again today. ? ?Patient's husband is at bedside and notes that she has a new right-sided facial droop, this was not noted here this morning, the patient that she notices after he started to have her weakness about an hour and a half ago.  I reviewed patient's chart she is followed by cardiology, she is being treated for orthostatic hypotension as well as labile hypertension, she is an active nicotine user. ? ?Home Medications ?Prior to Admission medications   ?Medication Sig Start Date End Date Taking?  Authorizing Provider  ?albuterol (PROVENTIL HFA;VENTOLIN HFA) 108 (90 BASE) MCG/ACT inhaler Inhale 2 puffs into the lungs every 6 (six) hours as needed for shortness of breath.    Yes [provider]  ?ALPRAZolam (XANAX) 0.5 MG tablet Take 0.5 mg by mouth 3 (three) times daily. 04/13/21  Yes [provider]  ?atorvastatin (LIPITOR) 20 MG tablet Take 20 mg by mouth at bedtime. 12/05/21  Yes [provider]  ?cloNIDine (CATAPRES) 0.1 MG tablet Take 0.1 mg by mouth daily as needed. If bp is over 150 10/29/21  Yes [provider]  ?EDARBI 80 MG TABS Take 1 tablet by mouth at bedtime. 03/14/21  Yes [provider]  ?nebivolol (BYSTOLIC) 10 MG tablet Take 1 tablet (10 mg total) by mouth daily. ?Patient taking differently: Take 10 mg by mouth at bedtime. 05/21/21 05/16/22 Yes Buford Dresser, MD  ?Arman Filter 1.5 MG capsule Take 1.5 mg by mouth daily. 03/06/21  Yes [provider]  ?acetaminophen (TYLENOL) 500 MG tablet Take 1,000 mg by mouth every 6 (six) hours as needed for pain.    [provider]  ?   ? ?Allergies    ?Bee venom, Codeine, Penicillins, Prilosec [omeprazole], and Zantac [ranitidine]   ? ?Review of Systems   ?Review of Systems  ?Constitutional:  Negative for chills and fever.  ?Respiratory:  Negative for shortness of breath.   ?Cardiovascular:  Negative for chest pain.  ?Gastrointestinal:  Negative for abdominal  pain.  ?Neurological:  Positive for weakness, numbness and headaches.  ? ?Physical Exam ?Updated Vital Signs ?BP (!) 145/88   Pulse 72   Temp 97.8 ?F (36.6 ?C) (Oral)   Resp 18   Ht '5\' 2"'$  (1.575 m)   Wt 68.9 kg   SpO2 98%   BMI 27.80 kg/m?  ?Physical Exam ?Vitals and nursing note reviewed.  ?Constitutional:   ?   General: She is not in acute distress. ?   Appearance: She is not ill-appearing.  ?HENT:  ?   Head: Normocephalic and atraumatic.  ?   Comments: Right-sided patient states seems slightly more swollen than the right side  mainly under the right eye and right cheek.  But no overlying skin changes. ?   Nose: No congestion.  ?   Mouth/Throat:  ?   Mouth: Mucous membranes are moist.  ?   Pharynx: Oropharynx is clear. No oropharyngeal exudate or posterior oropharyngeal erythema.  ?Eyes:  ?   Extraocular Movements: Extraocular movements intact.  ?   Conjunctiva/sclera: Conjunctivae normal.  ?   Pupils: Pupils are equal, round, and reactive to light.  ?Cardiovascular:  ?   Rate and Rhythm: Normal rate and regular rhythm.  ?   Pulses: Normal pulses.  ?   Heart sounds: No murmur heard. ?  No friction rub. No gallop.  ?Pulmonary:  ?   Effort: No respiratory distress.  ?   Breath sounds: No wheezing, rhonchi or rales.  ?Abdominal:  ?   Palpations: Abdomen is soft.  ?   Tenderness: There is no abdominal tenderness. There is no right CVA tenderness or left CVA tenderness.  ?Skin: ?   General: Skin is warm and dry.  ?Neurological:  ?   Mental Status: She is alert.  ?   Cranial Nerves: Cranial nerve deficit and facial asymmetry present.  ?   Sensory: Sensory deficit present.  ?   Motor: Weakness present.  ?   Coordination: Finger-Nose-Finger Test and Heel to [employer] normal.  ?   Gait: Gait abnormal.  ?   Comments: Patient is alert and oriented x4, no slurring of her words no difficulty with word finding, able to identify objects like a pen, she had notable right facial droop, along the right lower lip and decrease right eyebrow raise. She has decreased strength in the left shoulder with shoulder shrugs 4/5.  All other cranial nerves II through XII grossly intact.  Patient noted decreased left grip strength 4-5.  she had normal strength in the lower extremities.  She endorses subjective paresthesias in the left arm and leg but my exam she had full sensation and was able to tell me which part of her body I was touching.  She had a noted abnormal gait favoring the left side appear to be staggering while walking.  ?Psychiatric:     ?   Mood and  Affect: Mood normal.  ? ? ?ED Results / Procedures / Treatments   ?Labs ?(all labs ordered are listed, but only abnormal results are displayed) ?Labs Reviewed  ?URINALYSIS, ROUTINE W REFLEX MICROSCOPIC - Abnormal; Notable for the following components:  ?    Result Value  ? Color, Urine STRAW (*)   ? Specific Gravity, Urine 1.004 (*)   ? Hgb urine dipstick SMALL (*)   ? Bacteria, UA RARE (*)   ? All other components within normal limits  ?RAPID URINE DRUG SCREEN, HOSP PERFORMED - Abnormal; Notable for the following components:  ? Benzodiazepines POSITIVE (*)   ?  All other components within normal limits  ?I-STAT CHEM 8, ED - Abnormal; Notable for the following components:  ? Calcium, Ion 1.05 (*)   ? All other components within normal limits  ?RESP PANEL BY RT-PCR (FLU A&B, COVID) [id number]  ?BASIC METABOLIC PANEL  ?CBC  ?ETHANOL  ?PROTIME-INR  ?APTT  ?DIFFERENTIAL  ?CBG MONITORING, ED  ?CBG MONITORING, ED  ?CBG MONITORING, ED  ?POC URINE PREG, ED  ? ? ?EKG ?EKG Interpretation ? ?Date/Time:  Tuesday January 01 2022 09:55:27 EDT ?Ventricular Rate:  80 ?PR Interval:  162 ?QRS Duration: 92 ?QT Interval:  382 ?QTC Calculation: 441 ?R Axis:   77 ?Text Interpretation: Sinus rhythm Confirmed by Godfrey Pick [phone]) on 01/01/2022 10:49:38 AM ? ?Radiology ?MR BRAIN WO CONTRAST ? ?Result Date: 01/01/2022 ?CLINICAL DATA:  TIA. Acute neuro deficit. Left arm weakness and numbness. EXAM: MRI HEAD WITHOUT CONTRAST MRI CERVICAL SPINE WITHOUT CONTRAST TECHNIQUE: Multiplanar, multiecho pulse sequences of the brain and surrounding structures, and cervical spine, to include the craniocervical junction and cervicothoracic junction, were obtained without intravenous contrast. COMPARISON:  CT head 01/01/2022 FINDINGS: MRI HEAD FINDINGS Brain: No acute infarction, hemorrhage, hydrocephalus, extra-axial collection or mass lesion. Small hyperintensities in the right frontal white matter and in the central pons appear chronic. Vascular: Normal arterial  flow voids at the skull base Skull and upper cervical spine: No focal skeletal lesion. Sinuses/Orbits: Mild mucosal edema paranasal sinuses. Negative orbit Other: None MRI CERVICAL SPINE FINDINGS Alignment: Normal Ve

## 2022-01-01 NOTE — H&P (Signed)
?                                                                                           ? ? ? ? Patient Demographics:  ? ? ?[name], is a 56 y.o. female  MRN: [id number]   DOB - 1966/01/16 ? ?Admit Date - 01/01/2022 ? ?Outpatient Primary MD for the patient is Celene Squibb, MD ? ? Assessment & Plan:  ? ?Assessment and Plan: ? ?1) possible recurrent TIAs--- neuro consult appreciated ?-CT head ,MRI  brain without contrast, MRI C-spine CTA angio and MRI brain with contrast--- are all without acute strokes or LVO ?-Neurologist recommended echo, EEG admission to the hospital for telemetry monitoring and possible outpatient work-up for pheochromocytoma in the near future ?-A1c and lipid profile requested ? take Aspirin 81 mg daily along with Plavix 75 mg daily for 21 days then after that STOP the Plavix  and continue ONLY Aspirin 81 mg daily indefinitely--for secondary stroke Prevention (Per The multicenter SAMMPRIS trial) ?-Patient passed swallow test and gait appears steady at this time neuro symptoms appears to have mostly resolved ?#Please maintain euthermia. Tylenol prn for temp >100.4 ?# Telemetry monitoring ? ? ?2) possible pheochromocytoma--recurrent episodes of elevated HTN, HA, Dizziness, diaphoresis and Nausea from time to time for over 3 years  ?-Patient had carotid artery ultrasound back in September 2020, CT coronary with calcium score also in September 2020 and Holter monitor in October 2020 ?-Recommend outpatient follow-up with Dr. Loni Beckwith for metanephrine and other pheochromocytoma work-up ? ?3)HTN-resume PTA meds, no acute stroke ?-Recurrent episodes of elevated BP appears to be episodic and typically resolve spontaneously--- suggestive of #2 above ? ?4) COPD/tobacco abuse--no acute exacerbation bronchodilators as ordered, smoking cessation advised nicotine patch given ? ?5) anxiety disorder--- continue PTA  Xanax ? ?Disposition/Need for in-Hospital Stay- patient unable to be discharged at this time due to --- TIA/possible seizures--- requiring further work-up please see neurology consult ? ?Dispo: The patient is from: Home ?             Anticipated d/c is to: Home ?             Anticipated d/c date is: 1 day ?             Patient currently is not medically stable to d/c. ?Barriers: Not Clinically Stable-  ? ? ?With History of - ?Reviewed by me ? ?Past Medical History:  ?Diagnosis Date  ? Asthma   ? COPD (chronic obstructive pulmonary disease) ([place])   ? Depression   ? Seasonal allergies   ?   ? ?Past Surgical History:  ?Procedure Laterality Date  ? ABDOMINAL HYSTERECTOMY    ? CHOLECYSTECTOMY    ? COLONOSCOPY  05/21/2011  ? Procedure: COLONOSCOPY;  Surgeon: Rogene Houston, MD;  Location: [facility];  Service: Endoscopy;  Laterality: N/A;  12:30  ? ESOPHAGOGASTRODUODENOSCOPY    ? TUBAL LIGATION    ? ?Chief Complaint  ?Patient presents with  ? Hypertension  ?  ? ? HPI:  ? ? [name]  is a 55 y.o. female with Pmhx relevant for  tobacco abuse, HTN, COPD, Depression, HLD who presents with c/o recurrent episodes of elevated HTN, HA, Dizziness, diaphoresis and Nausea from time to time for over 3 years now presents to the ED with numbness, weakness and tingling of the left upper and left lower extremity that lasted over an hour and then resolved ?-No chest pain no, no pleuritic symptoms ?-Additional history obtained from patient's husband at bedside ?-In ED patient was evaluated by cardiologist Dr. Su Monks ?-Chest x-ray without acute findings ?-CT head ,MRI  brain without contrast, MRI C-spine CTA angio and MRI brain with contrast--- are all without acute strokes or LVO ?-Neurologist recommended echo, EEG admission to the hospital for telemetry monitoring and possible outpatient work-up for pheochromocytoma in the near future ?No fever  Or chills ?- ?BMP and CBC WNL ?-UA without infection, UDS with benzos ? ? Review  of systems:  ?  ?In addition to the HPI above,  ? ?A full Review of  Systems was done, all other systems reviewed are negative except as noted above in HPI , . ? ? ? Social History:  ?Reviewed by me ? ?  ?Social History  ? ?Tobacco Use  ? Smoking status: Every Day  ?  Packs/day: 1.00  ?  Years: 23.00  ?  Pack years: 23.00  ?  Types: Cigarettes  ? Smokeless tobacco: Never  ?Substance Use Topics  ? Alcohol use: No  ? ? ? Family History :  ?Reviewed by me ? ?  ?Family History  ?Problem Relation Age of Onset  ? Colon cancer Other   ? ? ? Home Medications:  ? ?Prior to Admission medications   ?Medication Sig Start Date End Date Taking? Authorizing Provider  ?albuterol (PROVENTIL HFA;VENTOLIN HFA) 108 (90 BASE) MCG/ACT inhaler Inhale 2 puffs into the lungs every 6 (six) hours as needed for shortness of breath.    Yes [provider]  ?ALPRAZolam (XANAX) 0.5 MG tablet Take 0.5 mg by mouth 3 (three) times daily. 04/13/21  Yes [provider]  ?atorvastatin (LIPITOR) 20 MG tablet Take 20 mg by mouth at bedtime. 12/05/21  Yes [provider]  ?cloNIDine (CATAPRES) 0.1 MG tablet Take 0.1 mg by mouth daily as needed. If bp is over 150 10/29/21  Yes [provider]  ?EDARBI 80 MG TABS Take 1 tablet by mouth at bedtime. 03/14/21  Yes [provider]  ?nebivolol (BYSTOLIC) 10 MG tablet Take 1 tablet (10 mg total) by mouth daily. ?Patient taking differently: Take 10 mg by mouth at bedtime. 05/21/21 05/16/22 Yes Buford Dresser, MD  ?Arman Filter 1.5 MG capsule Take 1.5 mg by mouth daily. 03/06/21  Yes [provider]  ?acetaminophen (TYLENOL) 500 MG tablet Take 1,000 mg by mouth every 6 (six) hours as needed for pain.    [provider]  ? ? ? Allergies:  ? ?  ?Allergies  ?Allergen Reactions  ? Bee Venom Anaphylaxis  ? Codeine Nausea And Vomiting and Rash  ? Penicillins Nausea And Vomiting and Rash  ? Prilosec [Omeprazole] Swelling and Rash  ? Zantac [Ranitidine] Rash  ? ? ?  Physical Exam:  ? ?Vitals ? ?Blood pressure (!) 145/88, pulse 72, temperature 97.8 ?F (36.6 ?C), temperature source Oral, resp. rate 18, height '5\' 2"'$  (1.575 m), weight 68.9 kg, SpO2 98 %. ? ?Physical Examination: General appearance - alert,  in no distress  ?Mental status - alert, oriented to person, place, and time,  ?Eyes - sclera anicteric ?Neck - supple, no JVD elevation , ?  Chest - clear  to auscultation bilaterally, symmetrical air movement,  ?Heart - S1 and S2 normal, regular  ?Abdomen - soft, nontender, nondistended, +BS ?Neurological - screening mental status exam normal, neck supple without rigidity, cranial nerves II through XII intact, DTR's normal and symmetric ?Extremities - no pedal edema noted, intact peripheral pulses  ?Skin - warm, dry ?Neurologial Exam:--- At the time of my evaluation patient's neuro symptoms had resolved ?Mental Status: ?Patient is awake, alert, oriented to person, place, month, year, and situation. ?Patient is able to give a clear and coherent history. ?No signs of aphasia or neglect ?Cranial Nerves: ?II: Visual Fields are full. Pupils are equal, round, and reactive to light.   ?III,IV, VI: EOMI without ptosis or diploplia.  ?V: Facial sensation is symmetrical and wnl ?VII: Facial movement is symmetric.  ?VIII: hearing is intact to voice ?X: Uvula elevates symmetrically ?XI: Shoulder shrug is symmetric. ?XII: tongue is midline without atrophy or fasciculations.  ?Motor: ?Tone is normal. Bulk is normal. 5/5 strength was present in all four extremities.  ?Sensory: ?Sensation is normal to pinprick and to touch ?Cerebellar: ?FNF without ataxia ? ? Data Review:  ? ? CBC ?Recent Labs  ?Lab 01/01/22 ?1034 01/01/22 ?1035  ?WBC 7.8  --   ?HGB 14.8 15.0  ?HCT 44.7 44.0  ?PLT 168  --   ?MCV 98.7  --   ?MCH 32.7  --   ?MCHC 33.1  --   ?RDW 12.8  --   ?LYMPHSABS 1.8  --   ?MONOABS 0.4  --   ?EOSABS 0.1  --   ?BASOSABS 0.1  --    ? ?------------------------------------------------------------------------------------------------------------------ ? ?Chemistries  ?Recent Labs  ?Lab 01/01/22 ?1034 01/01/22 ?1035  ?NA 139 138  ?K 3.7 3.9  ?CL 103 103  ?CO2 26  --   ?GLUCOSE 98 95  ?BUN 17 16  ?CREATI

## 2022-01-01 NOTE — Progress Notes (Signed)
Code Stroke Time Documentation  ? ?2767 Call Time ?[place] Exam Started  ?[place] ?1039 Images sent to [facility] ?1040 Exam completed in Epic  ?[place] radiology called  ? ?

## 2022-01-01 NOTE — Progress Notes (Signed)
Dr. Quinn Axe on cart for neuro assessment ?

## 2022-01-01 NOTE — Progress Notes (Signed)
Code stroke activated ?

## 2022-01-02 ENCOUNTER — Observation Stay (HOSPITAL_BASED_OUTPATIENT_CLINIC_OR_DEPARTMENT_OTHER): Payer: BC Managed Care – PPO

## 2022-01-02 ENCOUNTER — Observation Stay (HOSPITAL_COMMUNITY)
Admit: 2022-01-02 | Discharge: 2022-01-02 | Disposition: A | Discharge status: Home / Self Care | Payer: BC Managed Care – PPO | Attending: Family Medicine | Admitting: Family Medicine

## 2022-01-02 DIAGNOSIS — Z716 Tobacco abuse counseling: Secondary | ICD-10-CM | POA: Diagnosis not present

## 2022-01-02 DIAGNOSIS — K219 Gastro-esophageal reflux disease without esophagitis: Secondary | ICD-10-CM

## 2022-01-02 DIAGNOSIS — I10 Essential (primary) hypertension: Secondary | ICD-10-CM

## 2022-01-02 DIAGNOSIS — I471 Supraventricular tachycardia: Secondary | ICD-10-CM | POA: Diagnosis not present

## 2022-01-02 DIAGNOSIS — G459 Transient cerebral ischemic attack, unspecified: Secondary | ICD-10-CM | POA: Diagnosis not present

## 2022-01-02 DIAGNOSIS — R9431 Abnormal electrocardiogram [ECG] [EKG]: Secondary | ICD-10-CM | POA: Diagnosis not present

## 2022-01-02 LAB — ECHOCARDIOGRAM COMPLETE
AR max vel: 2 cm2
AV Area VTI: 1.91 cm2
AV Area mean vel: 1.89 cm2
AV Mean grad: 3 mmHg
AV Peak grad: 5.2 mmHg
Ao pk vel: 1.14 m/s
Area-P 1/2: 5.27 cm2
Calc EF: 52.8 %
Height: 62 in
MV VTI: 2.2 cm2
S' Lateral: 2.7 cm
Single Plane A2C EF: 57.7 %
Single Plane A4C EF: 49 %
Weight: 2515.2 oz

## 2022-01-02 LAB — BASIC METABOLIC PANEL
Anion gap: 9 (ref 5–15)
BUN: 13 mg/dL (ref 6–20)
CO2: 26 mmol/L (ref 22–32)
Calcium: 8.6 mg/dL — ABNORMAL LOW (ref 8.9–10.3)
Chloride: 105 mmol/L (ref 98–111)
Creatinine, Ser: 0.71 mg/dL (ref 0.44–1.00)
GFR, Estimated: >60 mL/min (ref >60)
Glucose, Bld: 145 mg/dL — ABNORMAL HIGH (ref 70–99)
Potassium: 3.6 mmol/L (ref 3.5–5.1)
Sodium: 140 mmol/L (ref 135–145)

## 2022-01-02 LAB — CBC
HCT: 39.5 % (ref 36.0–46.0)
Hemoglobin: 13.2 g/dL (ref 12.0–15.0)
MCH: 32.3 pg (ref 26.0–34.0)
MCHC: 33.4 g/dL (ref 30.0–36.0)
MCV: 96.6 fL (ref 80.0–100.0)
Platelets: 166 10*3/uL (ref 150–400)
RBC: 4.09 MIL/uL (ref 3.87–5.11)
RDW: 12.9 % (ref 11.5–15.5)
WBC: 7.5 10*3/uL (ref 4.0–10.5)
nRBC: 0 % (ref 0.0–0.2)

## 2022-01-02 LAB — LIPID PANEL
Cholesterol: 144 mg/dL (ref 0–200)
HDL: 48 mg/dL (ref >40)
LDL Cholesterol: 79 mg/dL (ref 0–99)
Total CHOL/HDL Ratio: 3 RATIO
Triglycerides: 86 mg/dL (ref <150)
VLDL: 17 mg/dL (ref 0–40)

## 2022-01-02 LAB — HIV ANTIBODY (ROUTINE TESTING W REFLEX): HIV Screen 4th Generation wRfx: NONREACTIVE

## 2022-01-02 MED ORDER — CLOPIDOGREL BISULFATE 75 MG PO TABS: 75.0000 mg | ORAL_TABLET | Freq: Every day | ORAL | 0 refills | Status: AC

## 2022-01-02 MED ORDER — ASPIRIN 81 MG PO TBEC: 81.0000 mg | DELAYED_RELEASE_TABLET | Freq: Every day | ORAL | 3 refills | Status: DC

## 2022-01-02 MED ORDER — NICOTINE 14 MG/24HR TD PT24: 14.0000 mg | MEDICATED_PATCH | Freq: Every day | TRANSDERMAL | 1 refills | Status: DC

## 2022-01-02 NOTE — Assessment & Plan Note (Signed)
-   Cessation counseling provided ?-Nicotine patch prescribed. ?

## 2022-01-02 NOTE — Progress Notes (Signed)
EEG complete - results pending 

## 2022-01-02 NOTE — Progress Notes (Signed)
*  PRELIMINARY RESULTS* ?Echocardiogram ?2D Echocardiogram has been performed. ? ?[name] ?01/02/2022, 12:11 PM ?

## 2022-01-02 NOTE — Procedures (Signed)
Patient Name: [name]  ?MRN: [id number]  ?Epilepsy Attending: Lora Havens  ?Referring Physician/Provider: Roxan Hockey, MD ?Date: 01/02/2022 ?Duration: 23.14 mins ? ?Patient history: 56 year old woman with a past medical history significant for asthma, COPD, depression who presents for recurrent L arm > face=leg paresthesias +/- mild weakness in the setting of hypertensive emergency. EEG to evaluate for seizure ? ?Level of alertness: Awake, drowsy ? ?AEDs during EEG study: Xanax ? ?Technical aspects: This EEG study was done with scalp electrodes positioned according to the 10-20 International system of electrode placement. Electrical activity was acquired at a sampling rate of '500Hz'$  and reviewed with a high frequency filter of '70Hz'$  and a low frequency filter of '1Hz'$ . EEG data were recorded continuously and digitally stored.  ? ?Description: The posterior dominant rhythm consists of 8 Hz activity of moderate voltage (25-35 uV) seen predominantly in posterior head regions, symmetric and reactive to eye opening and eye closing. Drowsiness was characterized by attenuation of the posterior background rhythm. Hyperventilation and photic stimulation were not performed.    ? ?IMPRESSION: ?This study is within normal limits. No seizures or epileptiform discharges were seen throughout the recording. ? ?Lora Havens  ? ?

## 2022-01-02 NOTE — Assessment & Plan Note (Signed)
-  Overall stable ?-Patient will continue outpatient work-up to rule out pheochromocytoma. ?-Continue outpatient follow-up with cardiology as previously instructed. ?-2D echo reassuring without significant valvular abnormalities and preserved ejection fraction. ?

## 2022-01-02 NOTE — Discharge Summary (Signed)
?Physician Discharge Summary ?  ?Patient: [name] MRN: [id number] DOB: [birth year]  ?Admit date:     01/01/2022  ?Discharge date: 01/02/22  ?Discharge Physician: Barton Dubois  ? ?PCP: Celene Squibb, MD  ? ?Recommendations at discharge:  ?Reassess blood pressure and further adjust antihypertensive agents as needed ?Make sure patient has follow-up as instructed with endocrinologist (Dr. Dorris Fetch) for further work-up to rule out pheochromocytoma. ?Repeat basic metabolic panel to follow ultralights renal function. ?Continue assisting patient with a smoking cessation. ? ?Discharge Diagnoses: ?Principal Problem: ?  TIA (transient ischemic attack) ?Active Problems: ?  Essential hypertension ?  Tobacco abuse counseling ?  Paroxysmal SVT (supraventricular tachycardia) (HCC) ?  Gastroesophageal reflux disease ?COPD ?Anxiety  ? ?Brief Hospital narrative Course: ?[name]  is a 56 y.o. female with Pmhx relevant for tobacco abuse, HTN, COPD, Depression, HLD who presents with c/o recurrent episodes of elevated HTN, HA, Dizziness, diaphoresis and Nausea from time to time for over 3 years now presents to the ED with numbness, weakness and tingling of the left upper and left lower extremity that lasted over an hour and then resolved ?-No chest pain no, no pleuritic symptoms ?-Additional history obtained from patient's husband at bedside ?-In ED patient was evaluated by cardiologist Dr. Su Monks ?-Chest x-ray without acute findings ?-CT head ,MRI  brain without contrast, MRI C-spine CTA angio and MRI brain with contrast--- are all without acute strokes or LVO ?-Neurologist recommended echo, EEG admission to the hospital for telemetry monitoring and possible outpatient work-up for pheochromocytoma in the near future ?No fever  Or chills ? ?BMP and CBC WNL ?-UA without infection, UDS with benzos ? ?Assessment and Plan: ?* TIA (transient ischemic attack) ?-Work-up has demonstrated no acute stroke process. ?-Case discussed with  neurology service who has recommended continue risk factor modifications and dual antiplatelet therapy for 21 days prior to continuation of aspirin for secondary prevention. ?-Continue smoking cessation ? ?Paroxysmal SVT (supraventricular tachycardia) (HCC) ?-Overall stable ?-Patient will continue outpatient work-up to rule out pheochromocytoma. ?-Continue outpatient follow-up with cardiology as previously instructed. ?-2D echo reassuring without significant valvular abnormalities and preserved ejection fraction. ? ?Tobacco abuse counseling ?- Cessation counseling provided ?-Nicotine patch prescribed. ? ?Essential hypertension ?- Stable overall currently ?-Patient has been experiencing intermittent episodes of elevated hypertension with associated dizziness, diaphoresis and headaches. ?-She is pending for outpatient work-up regarding metanephrine evaluation and further assessment to rule out pheochromocytoma. ?-Lifestyle modifications recommended. ?-Resume home antihypertensive agents. ? ?Gastroesophageal reflux disease ?-Patient reports to be allergic to PPIs; continue the use of Pepcid. ?-Lifestyle changes discussed with patient. ? ?COPD ?-Cessation has been instructed ?-Resume home inhalers/bronchodilator management. ?-Currently without acute exacerbation. ? ?Anxiety ?-Continue home anxiolytic regimen ?-Overall stable. ? ?Consultants: Neurology service was curbside. ?Procedures performed: See below for x-ray report; 2D echo: Demonstrating no significant valvular disorder and preserved ejection fraction.  EEG negative for presence of epileptic waves. ?Disposition: Discharged home with instruction to follow-up with PCP in 10 days. ? ?Diet recommendation:  ?Discharge Diet Orders (From admission, onward)  ? ?  Start     Ordered  ? 01/02/22 0000  Diet - low sodium heart healthy       ? 01/02/22 1536  ? ?  ?  ? ?  ? ?DISCHARGE MEDICATION: ?Allergies as of 01/02/2022   ? ?   Reactions  ? Bee Venom Anaphylaxis  ? Codeine  Nausea And Vomiting, Rash  ? Penicillins Nausea And Vomiting, Rash  ? Prilosec [omeprazole] Swelling, Rash  ?  Zantac [ranitidine] Rash  ? ?  ? ?  ?Medication List  ?  ? ?TAKE these medications   ? ?acetaminophen 500 MG tablet ?Commonly known as: TYLENOL ?Take 1,000 mg by mouth every 6 (six) hours as needed for pain. ?  ?albuterol 108 (90 Base) MCG/ACT inhaler ?Commonly known as: VENTOLIN HFA ?Inhale 2 puffs into the lungs every 6 (six) hours as needed for shortness of breath. ?  ?ALPRAZolam 0.5 MG tablet ?Commonly known as: Duanne Moron ?Take 0.5 mg by mouth 3 (three) times daily. ?  ?aspirin 81 MG EC tablet ?Take 1 tablet (81 mg total) by mouth daily with breakfast. Swallow whole. ?Start taking on: January 03, 2022 ?  ?atorvastatin 20 MG tablet ?Commonly known as: LIPITOR ?Take 20 mg by mouth at bedtime. ?  ?cloNIDine 0.1 MG tablet ?Commonly known as: CATAPRES ?Take 0.1 mg by mouth daily as needed. If bp is over 150 ?  ?clopidogrel 75 MG tablet ?Commonly known as: PLAVIX ?Take 1 tablet (75 mg total) by mouth daily for 21 days. ?Start taking on: January 03, 2022 ?  ?Edarbi 80 MG Tabs ?Generic drug: Azilsartan Medoxomil ?Take 1 tablet by mouth at bedtime. ?  ?nebivolol 10 MG tablet ?Commonly known as: Bystolic ?Take 1 tablet (10 mg total) by mouth daily. ?What changed: when to take this ?  ?nicotine 14 mg/24hr patch ?Commonly known as: NICODERM CQ - dosed in mg/24 hours ?Place 1 patch (14 mg total) onto the skin daily. ?Start taking on: January 03, 2022 ?  ?Vraylar 1.5 MG capsule ?Generic drug: cariprazine ?Take 1.5 mg by mouth daily. ?  ? ?  ? ? Follow-up Information   ? ? Cassandria Anger, MD. Schedule an appointment as soon as possible for a visit in 1 week(s).   ?Specialty: Endocrinology ?Why: Needs work up for Possible Phaeochromocytoma ?Contact information: ?[address] ?[place] [address] ?[phone] ? ? ?  ?  ? ? Garvin Fila, MD. Schedule an appointment as soon as possible for a visit.   ?Specialties:  Neurology, Radiology ?Why: --Recurrent TIA and Possible Seizures ?Contact information: ?[address] ?[address] ?[place] [address] ?[phone] ? ? ?  ?  ? ? Celene Squibb, MD. Schedule an appointment as soon as possible for a visit in 10 day(s).   ?Specialty: Internal Medicine ?Contact information: ?[address] ?[place] ?[place] [address] ?[phone] ? ? ?  ?  ? ? Buford Dresser, MD .   ?Specialty: Cardiology ?Contact information: ?[address] ?[address] ?[place] [address] ?[phone] ? ? ?  ?  ? ?  ?  ? ?  ? ?Discharge Exam: ?Filed Weights  ? 01/01/22 0955 01/01/22 2106  ?Weight: 68.9 kg 71.3 kg  ? ?General exam: Alert, awake, oriented x 3; no chest pain, no nausea, no vomiting.  Patient without aphasia, residual neurologic deficits, headaches or any other complaint. ?Respiratory system: Clear to auscultation. Respiratory effort normal.  Good saturation on room air. ?Cardiovascular system:RRR. No murmurs, rubs, gallops.  No JVD. ?Gastrointestinal system: Abdomen is nondistended, soft and nontender. No organomegaly or masses felt. Normal bowel sounds heard. ?Central nervous system: Alert and oriented. No focal neurological deficits. ?Extremities: No cyanosis or clubbing. ?Skin: No rashes, no petechiae ?Psychiatry: Judgement and insight appear normal. Mood & affect appropriate.  ? ? ?Condition at discharge: Stable and improved. ? ?The results of significant diagnostics from this hospitalization (including imaging, microbiology, ancillary and laboratory) are listed below for reference.  ? ?Imaging  Studies: ?CT ANGIO HEAD NECK W WO CM ? ?Result Date: 01/01/2022 ?CLINICAL DATA:  Hypertension, headache, dizziness, code stroke. Tingling in left arm EXAM: CT ANGIOGRAPHY HEAD AND NECK TECHNIQUE: Multidetector CT imaging of the head and neck was performed using the standard protocol during bolus administration of intravenous contrast. Multiplanar CT image reconstructions and MIPs were obtained to  evaluate the vascular anatomy. Carotid stenosis measurements (when applicable) are obtained utilizing NASCET criteria, using the distal internal carotid diameter as the denominator. RADIATION DOSE REDUCTION:

## 2022-01-02 NOTE — Assessment & Plan Note (Signed)
-  Work-up has demonstrated no acute stroke process. ?-Case discussed with neurology service who has recommended continue risk factor modifications and dual antiplatelet therapy for 21 days prior to continuation of aspirin for secondary prevention. ?-Continue smoking cessation ?

## 2022-01-02 NOTE — Assessment & Plan Note (Signed)
-  Patient reports to be allergic to PPIs; continue the use of Pepcid. ?-Lifestyle changes discussed with patient. ?

## 2022-01-02 NOTE — Assessment & Plan Note (Signed)
-   Stable overall currently ?-Patient has been experiencing intermittent episodes of elevated hypertension with associated dizziness, diaphoresis and headaches. ?-She is pending for outpatient work-up regarding metanephrine evaluation and further assessment to rule out pheochromocytoma. ?-Lifestyle modifications recommended. ?-Resume home antihypertensive agents. ?

## 2022-01-02 NOTE — Progress Notes (Signed)
Nsg Discharge Note ? ?Admit Date:  01/01/2022 ?Discharge date: 01/02/2022 ?  ?[name] to be D/C'd Home per MD order.  AVS completed.  ?Patient/caregiver able to verbalize understanding. ? ?Discharge Medication: ?Allergies as of 01/02/2022   ? ?   Reactions  ? Bee Venom Anaphylaxis  ? Codeine Nausea And Vomiting, Rash  ? Penicillins Nausea And Vomiting, Rash  ? Prilosec [omeprazole] Swelling, Rash  ? Zantac [ranitidine] Rash  ? ?  ? ?  ?Medication List  ?  ? ?TAKE these medications   ? ?acetaminophen 500 MG tablet ?Commonly known as: TYLENOL ?Take 1,000 mg by mouth every 6 (six) hours as needed for pain. ?  ?albuterol 108 (90 Base) MCG/ACT inhaler ?Commonly known as: VENTOLIN HFA ?Inhale 2 puffs into the lungs every 6 (six) hours as needed for shortness of breath. ?  ?ALPRAZolam 0.5 MG tablet ?Commonly known as: Duanne Moron ?Take 0.5 mg by mouth 3 (three) times daily. ?  ?aspirin 81 MG EC tablet ?Take 1 tablet (81 mg total) by mouth daily with breakfast. Swallow whole. ?Start taking on: January 03, 2022 ?  ?atorvastatin 20 MG tablet ?Commonly known as: LIPITOR ?Take 20 mg by mouth at bedtime. ?  ?cloNIDine 0.1 MG tablet ?Commonly known as: CATAPRES ?Take 0.1 mg by mouth daily as needed. If bp is over 150 ?  ?clopidogrel 75 MG tablet ?Commonly known as: PLAVIX ?Take 1 tablet (75 mg total) by mouth daily for 21 days. ?Start taking on: January 03, 2022 ?  ?Edarbi 80 MG Tabs ?Generic drug: Azilsartan Medoxomil ?Take 1 tablet by mouth at bedtime. ?  ?nebivolol 10 MG tablet ?Commonly known as: Bystolic ?Take 1 tablet (10 mg total) by mouth daily. ?What changed: when to take this ?  ?nicotine 14 mg/24hr patch ?Commonly known as: NICODERM CQ - dosed in mg/24 hours ?Place 1 patch (14 mg total) onto the skin daily. ?Start taking on: January 03, 2022 ?  ?Vraylar 1.5 MG capsule ?Generic drug: cariprazine ?Take 1.5 mg by mouth daily. ?  ? ?  ? ? ?Discharge Assessment: ?Vitals:  ? 01/02/22 0504 01/02/22 1427  ?BP: 114/75 132/65  ?Pulse: 77  76  ?Resp: 19 19  ?Temp: 97.8 ?F (36.6 ?C) 98.6 ?F (37 ?C)  ?SpO2: 95% 96%  ? Skin clean, dry and intact without evidence of skin break down, no evidence of skin tears noted. ?IV catheter discontinued intact. Site without signs and symptoms of complications - no redness or edema noted at insertion site, patient denies c/o pain - only slight tenderness at site.  Dressing with slight pressure applied. ? ?D/c Instructions-Education: ?Discharge instructions given to patient/family with verbalized understanding. ?D/c education completed with patient/family including follow up instructions, medication list, d/c activities limitations if indicated, with other d/c instructions as indicated by MD - patient able to verbalize understanding, all questions fully answered. ?Patient instructed to return to ED, call 911, or call MD for any changes in condition.  ?Patient escorted via [place], and D/C home via private auto. ? ?Kathie Rhodes, RN ?01/02/2022 3:49 PM  ?

## 2022-01-02 NOTE — TOC Transition Note (Signed)
Transition of Care (TOC) - CM/SW Discharge Note ? ? ?Patient Details  ?Name: [name] ?MRN: [id number] ?Date of Birth: Oct 24, 1965 ? ?Transition of Care (TOC) CM/SW Contact:  ?Salome Arnt, LCSW ?Phone Number: ?01/02/2022, 3:16 PM ? ? ?Clinical Narrative:   Per MD, possible d/c later today with no TOC needs.     ? ? ? ?Final next level of care: Home/Self Care ?Barriers to Discharge: Barriers Resolved ? ? ?Patient Goals and CMS Choice ?  ?  ?  ? ?Discharge Placement ?  ?           ?  ?  ?  ?  ? ?Discharge Plan and Services ?  ?  ?           ?  ?  ?  ?  ?  ?  ?  ?  ?  ?  ? ?Social Determinants of Health (SDOH) Interventions ?  ? ? ?Readmission Risk Interventions ?   ? View : No data to display.  ?  ?  ?  ? ? ? ? ? ?

## 2022-01-03 ENCOUNTER — Telehealth: Payer: Self-pay | Admitting: "Endocrinology

## 2022-01-03 NOTE — Telephone Encounter (Signed)
Scheduled for 4/18

## 2022-01-03 NOTE — Telephone Encounter (Signed)
Pt was seen in the ED and was told to follow up here from the ED doctor within a week. It says in regards to "Phaeochromocytoma" can you review her chart please ?

## 2022-01-04 DIAGNOSIS — F319 Bipolar disorder, unspecified: Secondary | ICD-10-CM | POA: Diagnosis not present

## 2022-01-04 DIAGNOSIS — Z8673 Personal history of transient ischemic attack (TIA), and cerebral infarction without residual deficits: Secondary | ICD-10-CM | POA: Diagnosis not present

## 2022-01-04 DIAGNOSIS — G8322 Monoplegia of upper limb affecting left dominant side: Secondary | ICD-10-CM | POA: Diagnosis not present

## 2022-01-04 DIAGNOSIS — Z1211 Encounter for screening for malignant neoplasm of colon: Secondary | ICD-10-CM | POA: Diagnosis not present

## 2022-01-08 ENCOUNTER — Encounter (INDEPENDENT_AMBULATORY_CARE_PROVIDER_SITE_OTHER): Payer: Self-pay | Admitting: *Deleted

## 2022-01-08 ENCOUNTER — Encounter: Payer: Self-pay | Admitting: "Endocrinology

## 2022-01-08 ENCOUNTER — Ambulatory Visit: Payer: BC Managed Care – PPO | Admitting: "Endocrinology

## 2022-01-08 VITALS — BP 141/82 | HR 72 | Ht 62.0 in | Wt 156.4 lb

## 2022-01-08 DIAGNOSIS — R002 Palpitations: Secondary | ICD-10-CM

## 2022-01-08 DIAGNOSIS — F17200 Nicotine dependence, unspecified, uncomplicated: Secondary | ICD-10-CM | POA: Diagnosis not present

## 2022-01-08 DIAGNOSIS — I10 Essential (primary) hypertension: Secondary | ICD-10-CM | POA: Diagnosis not present

## 2022-01-08 DIAGNOSIS — I471 Supraventricular tachycardia: Secondary | ICD-10-CM

## 2022-01-08 MED ORDER — NEBIVOLOL HCL 10 MG PO TABS: 10.0000 mg | ORAL_TABLET | Freq: Every day | ORAL | 3 refills | Status: DC

## 2022-01-08 MED ORDER — CLONIDINE HCL 0.1 MG PO TABS: 0.1000 mg | ORAL_TABLET | Freq: Two times a day (BID) | ORAL | 2 refills | Status: DC

## 2022-01-08 NOTE — Progress Notes (Signed)
? ?    Endocrinology Consult Note ?                                           01/08/2022, 8:13 PM ? ? ?Subjective:  ? ? Patient ID: [name], female    DOB: 11-06-1965, PCP Celene Squibb, MD ? ? ?Past Medical History:  ?Diagnosis Date  ? Asthma   ? COPD (chronic obstructive pulmonary disease) ([place])   ? Depression   ? Seasonal allergies   ? ?Past Surgical History:  ?Procedure Laterality Date  ? ABDOMINAL HYSTERECTOMY    ? CHOLECYSTECTOMY    ? COLONOSCOPY  05/21/2011  ? Procedure: COLONOSCOPY;  Surgeon: Rogene Houston, MD;  Location: [facility];  Service: Endoscopy;  Laterality: N/A;  12:30  ? ESOPHAGOGASTRODUODENOSCOPY    ? TUBAL LIGATION    ? ?Social History  ? ?Socioeconomic History  ? Marital status: Married  ?  Spouse name: Not on file  ? Number of children: Not on file  ? Years of education: Not on file  ? Highest education level: Not on file  ?Occupational History  ? Not on file  ?Tobacco Use  ? Smoking status: Every Day  ?  Packs/day: 1.00  ?  Years: 23.00  ?  Pack years: 23.00  ?  Types: Cigarettes  ? Smokeless tobacco: Never  ?Vaping Use  ? Vaping Use: Every day  ?Substance and Sexual Activity  ? Alcohol use: No  ? Drug use: No  ? Sexual activity: Yes  ?  Birth control/protection: Surgical  ?Other Topics Concern  ? Not on file  ?Social History Narrative  ? Not on file  ? ?Social Determinants of Health  ? ?Financial Resource Strain: Not on file  ?Food Insecurity: Not on file  ?Transportation Needs: Not on file  ?Physical Activity: Not on file  ?Stress: Not on file  ?Social Connections: Not on file  ? ?Family History  ?Problem Relation Age of Onset  ? Hypertension Mother   ? Hyperlipidemia Mother   ? Stroke Mother   ? Cancer Father   ? Colon cancer Other   ? ?Outpatient Encounter Medications as of 01/08/2022  ?Medication Sig  ? acetaminophen (TYLENOL) 500 MG tablet Take 1,000 mg by mouth every 6 (six) hours as needed for pain.  ? albuterol (PROVENTIL HFA;VENTOLIN HFA) 108 (90 BASE) MCG/ACT inhaler  Inhale 2 puffs into the lungs every 6 (six) hours as needed for shortness of breath.   ? ALPRAZolam (XANAX) 0.5 MG tablet Take 0.5 mg by mouth 3 (three) times daily.  ? aspirin EC 81 MG EC tablet Take 1 tablet (81 mg total) by mouth daily with breakfast. Swallow whole.  ? atorvastatin (LIPITOR) 20 MG tablet Take 20 mg by mouth at bedtime.  ? cloNIDine (CATAPRES) 0.1 MG tablet Take 1 tablet (0.1 mg total) by mouth 2 (two) times daily. If bp is over 150  ? clopidogrel (PLAVIX) 75 MG tablet Take 1 tablet (75 mg total) by mouth daily for 21 days.  ? EDARBI 80 MG TABS Take 1 tablet by mouth at bedtime.  ? nebivolol (BYSTOLIC) 10 MG tablet Take 1 tablet (10 mg total) by mouth daily.  ? nicotine (NICODERM CQ - DOSED IN MG/24 HOURS) 14 mg/24hr patch Place 1 patch (14 mg total) onto the skin daily. (Patient not taking: Reported on 01/08/2022)  ? VRAYLAR  1.5 MG capsule Take 3 mg by mouth daily.  ? [DISCONTINUED] cloNIDine (CATAPRES) 0.1 MG tablet Take 0.1 mg by mouth daily as needed. If bp is over 150  ? [DISCONTINUED] nebivolol (BYSTOLIC) 10 MG tablet Take 1 tablet (10 mg total) by mouth daily. (Patient taking differently: Take 10 mg by mouth at bedtime.)  ? ?No facility-administered encounter medications on file as of 01/08/2022.  ? ?ALLERGIES: ?Allergies  ?Allergen Reactions  ? Bee Venom Anaphylaxis  ? Codeine Nausea And Vomiting and Rash  ? Penicillins Nausea And Vomiting and Rash  ? Prilosec [Omeprazole] Swelling and Rash  ? Zantac [Ranitidine] Rash  ? ? ?VACCINATION STATUS: ? ?There is no immunization history on file for this patient. ? ?HPI ?[name] is 56 y.o. female who presents today with a medical history as above. she is being seen in consultation for hypertension requested by Celene Squibb, MD.   ?History is obtained directly from the patient as well as chart review.  She denies any prior history of adrenal dysfunction.  During her recent visit to ER, she was found to have labile hypertension/TIA. ?She was  discharged on 3 blood pressure medications.  However, she is taking clonidine 0.1 mg p.o. as needed, not consistent on her nebivolol 10 mg p.o. daily.  She reports that she is consistent on azilsartan 80 mg p.o. daily.  She has a blood pressure meter at home, did not bring it with her.  She is accompanied by her husband who believes that her blood pressure drops at times even though he does not give any specific readings. ?She was found to have blood pressure reading of 141/82 during her visit here. ?She reported to ER that she did have elevated hypertension associated with headaches, dizziness, diaphoresis, nausea from time to time over the last 3 years.  She is also known to have anxiety disorder on treatment.  Her medical history significant for chronic heavy smoking, admits to dietary indiscretions. ?She was advised to seek consult for work-up for possible pheochromocytoma. ?He does not have any family history of pheochromocytoma nor any other adrenal dysfunction.  Her echocardiogram findings included ejection fraction of 50-55%, right ventricular systolic function was normal, aortic valve was tricuspid, no atrial level shunt, no evidence of pulmonary stenosis. ?CT head showed patent vasculature of the head and neck with no significant stenosis or occlusion.  Her MRI did not show abnormal enhancement in the brain. ?She reports fluctuating body weight.  She does not have acute complaints today.  She denies chest pain, shortness of breath. ? ?Review of Systems ? ?Constitutional: + Minimally fluctuating body weight, no fatigue, no subjective hyperthermia, no subjective hypothermia ?Eyes: no blurry vision, no xerophthalmia ?ENT: no sore throat, no nodules palpated in throat, no dysphagia/odynophagia, no hoarseness ?Cardiovascular: no Chest Pain, no Shortness of Breath, no palpitations, no leg swelling ?Respiratory: no cough, no shortness of breath ?Gastrointestinal: no Nausea/Vomiting/Diarhhea ?Musculoskeletal: no  muscle/joint aches ?Skin: no rashes ?Neurological: no tremors, no numbness, no tingling, no dizziness ?Psychiatric: no depression, no anxiety ? ?Objective:  ?  ? ?  01/08/2022  ?  1:12 PM 01/02/2022  ?  2:27 PM 01/02/2022  ?  5:04 AM  ?Vitals with BMI  ?Height '5\' 2"'$     ?Weight 156 lbs 6 oz    ?BMI 28.6    ?Systolic 027 253 664  ?Diastolic 82 65 75  ?Pulse 72 76 77  ? ? ?BP (!) 141/82   Pulse 72   Ht 5'  2" (1.575 m)   Wt 156 lb 6.4 oz (70.9 kg)   BMI 28.61 kg/m?   ?Wt Readings from Last 3 Encounters:  ?01/08/22 156 lb 6.4 oz (70.9 kg)  ?01/01/22 157 lb 3.2 oz (71.3 kg)  ?05/21/21 151 lb 3.2 oz (68.6 kg)  ?  ?Physical Exam ? ?Constitutional:  Body mass index is 28.61 kg/m?.,  not in acute distress, normal state of mind ?Eyes: PERRLA, EOMI, no exophthalmos ?ENT: moist mucous membranes, no gross thyromegaly, no gross cervical lymphadenopathy ?Cardiovascular: normal precordial activity, Regular Rate and Rhythm, no Murmur/Rubs/Gallops ?Respiratory:  adequate breathing efforts, no gross chest deformity, + wheezes on bilateral lung fields. ?Gastrointestinal: abdomen soft, Non -tender, No distension, Bowel Sounds present, no gross organomegaly ?Musculoskeletal: no gross deformities, strength intact in all four extremities ?Skin: moist, warm, no rashes ?Neurological: no tremor with outstretched hands, Deep tendon reflexes normal in bilateral lower extremities. ? ?CMP ( most recent) ?CMP  ?   ?Component Value Date/Time  ? NA 140 01/02/2022 0447  ? NA 140 06/10/2019 1230  ? K 3.6 01/02/2022 0447  ? CL 105 01/02/2022 0447  ? CO2 26 01/02/2022 0447  ? GLUCOSE 145 (H) 01/02/2022 0447  ? BUN 13 01/02/2022 0447  ? BUN 12 06/10/2019 1230  ? CREATININE 0.71 01/02/2022 0447  ? CALCIUM 8.6 (L) 01/02/2022 0447  ? GFRNONAA >60 01/02/2022 0447  ? GFRAA 118 06/10/2019 1230  ? ? ?Diabetic Labs (most recent): ?Lab Results  ?Component Value Date  ? HGBA1C 5.5 01/01/2022  ? ? ? Lipid Panel ( most recent) ?Lipid Panel  ?   ?Component Value  Date/Time  ? CHOL 144 01/02/2022 0447  ? TRIG 86 01/02/2022 0447  ? HDL 48 01/02/2022 0447  ? CHOLHDL 3.0 01/02/2022 0447  ? VLDL 17 01/02/2022 0447  ? [place] 79 01/02/2022 0447  ? ?  ? ?Lab Results  ?Component Valu

## 2022-01-09 DIAGNOSIS — F17200 Nicotine dependence, unspecified, uncomplicated: Secondary | ICD-10-CM | POA: Insufficient documentation

## 2022-01-10 ENCOUNTER — Ambulatory Visit (HOSPITAL_BASED_OUTPATIENT_CLINIC_OR_DEPARTMENT_OTHER): Payer: BC Managed Care – PPO | Admitting: Cardiology

## 2022-01-10 VITALS — BP 157/87 | HR 74 | Ht 62.0 in | Wt 156.5 lb

## 2022-01-10 DIAGNOSIS — R0989 Other specified symptoms and signs involving the circulatory and respiratory systems: Secondary | ICD-10-CM | POA: Diagnosis not present

## 2022-01-10 DIAGNOSIS — R002 Palpitations: Secondary | ICD-10-CM | POA: Diagnosis not present

## 2022-01-10 DIAGNOSIS — Z7189 Other specified counseling: Secondary | ICD-10-CM

## 2022-01-10 DIAGNOSIS — Z716 Tobacco abuse counseling: Secondary | ICD-10-CM | POA: Diagnosis not present

## 2022-01-10 MED ORDER — HYDRALAZINE HCL 10 MG PO TABS: 10.0000 mg | ORAL_TABLET | Freq: Four times a day (QID) | ORAL | 3 refills | Status: DC | PRN

## 2022-01-10 NOTE — Progress Notes (Signed)
?Cardiology Office Note:   ? ?Date:  01/10/2022  ? ?ID:  [name], DOB 04-09-1966, MRN [id number] ? ?PCP:  Celene Squibb, MD  ?Cardiologist:  Buford Dresser, MD PhD ? ?Referring MD: Celene Squibb, MD  ? ?CC: follow up ? ?History of Present Illness:   ? ?[name] is a 56 y.o. female with a hx of tobacco use, hypertension who is seen for follow up today. I initially met her 05/25/2019 as a new consult at the request of Celene Squibb, MD for the evaluation and management of dizziness, palpitations, left arm/chest tightness. ? ?Today: ?She is accompanied by her husband and doing alright. Last week, she presented numbness and tingling of the L side of her body. She reports she lost sensation in her L arm and her husband reports her R face was drooping and swollen. Her blood pressure spiked to over 546F systolic. Her blood pressure continues to be labile. She underwent an electroencephalography and other imaging was negative for stroke. She denies chest pain, shortness of breath at rest or with normal exertion, PND, orthopnea, LE edema or unexpected weight gain. No syncope or palpitations.  ? 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 However, due to her recent TIA episode, she is not working. She has reduced her smoking. She has not taken her clonidine because she is worried about sudden low pressures.  ? ?Past Medical History:  ?Diagnosis Date  ? Asthma   ? COPD (chronic obstructive pulmonary disease) ([place])   ? Depression   ? Seasonal allergies   ? ? ?Past Surgical History:  ?Procedure Laterality Date  ? ABDOMINAL HYSTERECTOMY    ? CHOLECYSTECTOMY    ? COLONOSCOPY  05/21/2011  ? Procedure: COLONOSCOPY;  Surgeon: Rogene Houston, MD;  Location: [facility];  Service: Endoscopy;  Laterality: N/A;  12:30  ? ESOPHAGOGASTRODUODENOSCOPY    ? TUBAL LIGATION    ? ? ?Current Medications: ?Current Outpatient Medications on File Prior to Visit  ?Medication Sig  ? acetaminophen (TYLENOL) 500 MG  tablet Take 1,000 mg by mouth every 6 (six) hours as needed for pain.  ? albuterol (PROVENTIL HFA;VENTOLIN HFA) 108 (90 BASE) MCG/ACT inhaler Inhale 2 puffs into the lungs every 6 (six) hours as needed for shortness of breath.   ? ALPRAZolam (XANAX) 0.5 MG tablet Take 0.5 mg by mouth 3 (three) times daily.  ? aspirin EC 81 MG EC tablet Take 1 tablet (81 mg total) by mouth daily with breakfast. Swallow whole.  ? atorvastatin (LIPITOR) 20 MG tablet Take 20 mg by mouth at bedtime.  ? clopidogrel (PLAVIX) 75 MG tablet Take 1 tablet (75 mg total) by mouth daily for 21 days.  ? EDARBI 80 MG TABS Take 1 tablet by mouth at bedtime.  ? nebivolol (BYSTOLIC) 10 MG tablet Take 1 tablet (10 mg total) by mouth daily.  ? VRAYLAR 1.5 MG capsule Take 3 mg by mouth daily.  ? ?No current facility-administered medications on file prior to visit.  ?  ? ?Allergies:   Bee venom, Codeine, Penicillins, Prilosec [omeprazole], and Zantac [ranitidine]  ? ?Social History  ? ?Tobacco Use  ? Smoking status: Every Day  ?  Packs/day: 1.00  ?  Years: 23.00  ?  Pack years: 23.00  ?  Types: Cigarettes  ? Smokeless tobacco: Never  ?Vaping Use  ? Vaping Use: Every day  ?Substance Use Topics  ? Alcohol use: No  ? Drug use: No  ?  ? ? ?  Family History: ?The patient's family history includes Cancer in her father; Colon cancer in an other family member; Hyperlipidemia in her mother; Hypertension in her mother; Stroke in her mother. mother had a stroke at age 36, 21 had CHF, [name] had heart and blood pressure problems. Everyone else has cancer. ? ?ROS:   ?Please see the history of present illness.  Additional pertinent ROS otherwise unremarkable. ? ? ?EKGs/Labs/Other Studies Reviewed:   ? ?The following studies were reviewed today: ?Echo 01/02/22 ? 1. Left ventricular ejection fraction, by estimation, is 50 to 55%. The  ?left ventricle has low normal function. The left ventricle has no regional  ?wall motion abnormalities. Left ventricular diastolic  parameters were  ?normal.  ? 2. Right ventricular systolic function is normal. The right ventricular  ?size is normal. There is normal pulmonary artery systolic pressure.  ? 3. The mitral valve is normal in structure. No evidence of mitral valve  ?regurgitation. No evidence of mitral stenosis.  ? 4. The aortic valve is tricuspid. Aortic valve regurgitation is not  ?visualized. No aortic stenosis is present.  ? 5. The inferior vena cava is normal in size with greater than 50%  ?respiratory variability, suggesting right atrial pressure of 3 mmHg.  ? ?Comparison(s): No prior Echocardiogram.  ? ?CTA Head/Neck 01/01/22 ?IMPRESSION: ?Patent vasculature of the head and neck with no significant stenosis ?or occlusion. ? ?CT Head 01/01/22 ?IMPRESSION: ?1. No acute intracranial pathology. ?2. ASPECTS is 10 ?3. Empty sella. This finding is nonspecific and could reflect a ?normal variant common but can also be seen in the setting of ?intracranial hypertension. ? ?Blood Pressure Monitor 07/09/21 ?24 ambulatory summary: ?Mean BP 132/77, HR 81 ?Awake average 140/84, HR 82 ?Asleep average 111/62, HR 78 ?  ?Dip with sleep: systolic 38%, diastolic 88% ?  ?Max systolic: 280 ?Max diastolic: 034 ?  ?Normal BP variability, average BP near goal. ? ?CT coronary 06/16/2019 ?FINDINGS: ?Coronary calcium score: The patient's coronary artery calcium score ?is 0, which places the patient in the 0 percentile. ?  ?Coronary arteries: Normal coronary origins.  Right dominance. ?  ?Right Coronary Artery: Medium caliber vessel that give rise to PLB ?and PDA. No significant plaque or stenosis. ?  ?Left Main Coronary Artery: No significant plaque or stenosis. ?  ?Left Anterior Descending Coronary Artery: Gives rise to two medium ?sized diagonals. No significant plaque or stenosis in LAD or branch ?vessels. ?  ?Left Circumflex Artery: Gives rise to two large OM branches. No ?significant plaque or stenosis in LCx or branch vessels. ?  ?Aorta: Normal size, 27  mm at the mid ascending aorta (level of the ?PA bifurcation) measured double oblique. No calcifications. No ?dissection. ?  ?Aortic Valve: No calcifications. Trileaflet. ?  ?Other findings:  ?Normal pulmonary vein drainage into the left atrium.  ?Normal left atrial appendage without a thrombus.  ?Normal size of the pulmonary artery. ?  ?IMPRESSION: ?1. No evidence of CAD, CADRADS = 0.  ?2. Coronary calcium score of 0. This was 0 percentile for age and ?sex matched control.  ?3. Normal coronary origin with right dominance. ? ?Event monitor 08/30/2019 ?12 days of data recorded on Zio monitor. Patient had a min HR of 58 bpm, max HR of 184 bpm, and avg HR of 82 bpm. Predominant underlying rhythm was Sinus Rhythm. No VT, atrial fibrillation, high degree block, or pauses noted. Isolated atrial and ventricular ectopy was rare (<1%). There were 19 triggered events. There were 6 SVT events noted,  all very brief (longest 5 beats). These were detected near patient triggered event markers. ? ?Carotid u/s 06/07/2019 ?RIGHT CAROTID ARTERY: No significant calcifications of the right ?common carotid artery. Intermediate waveform maintained. ?Heterogeneous and partially calcified plaque at the right carotid ?bifurcation. No significant lumen shadowing. Low resistance waveform ?of the right ICA. No significant tortuosity. ?  ?RIGHT VERTEBRAL ARTERY: Antegrade flow with low resistance waveform. ?  ?LEFT CAROTID ARTERY: No significant calcifications of the left ?common carotid artery. Intermediate waveform maintained. ?Heterogeneous and partially calcified plaque at the left carotid ?bifurcation without significant lumen shadowing. Low resistance ?waveform of the left ICA. No significant tortuosity. ?  ?LEFT VERTEBRAL ARTERY:  Antegrade flow with low resistance waveform. ?  ?IMPRESSION: ?Color duplex indicates minimal heterogeneous and calcified plaque, ?with no hemodynamically significant stenosis by duplex criteria in ?the extracranial  cerebrovascular circulation. ? ?EKG:  EKG was personally reviewed today ?01/10/2022: not ordered today ?03/07/21 NSR 81 bpm ? ?Recent Labs: ?01/01/2022: TSH 0.911 ?01/02/2022: BUN 13; Creatinine, Ser 0.71

## 2022-01-10 NOTE — Patient Instructions (Signed)
Medication Instructions:  ?Your physician has recommended you make the following change in your medication:  ? ?Start: PRN Hydralazine '10mg'$  tablet every 6 hours as needed for a blood pressure (top number) of more than 160  ? ? ?*If you need a refill on your cardiac medications before your next appointment, please call your pharmacy* ? ? ?Lab Work: ?Please return for labs Monday Morning.  ? ? ?If you have labs (blood work) drawn today and your tests are completely normal, you will receive your results only by: ?MyChart Message (if you have MyChart) OR ?A paper copy in the mail ?If you have any lab test that is abnormal or we need to change your treatment, we will call you to review the results. ? ? ?Testing/Procedures: ?None ordered today  ? ? ?Follow-Up: ?At [facility], you and your health needs are our priority.  As part of our continuing mission to provide you with exceptional heart care, we have created designated Provider Care Teams.  These Care Teams include your primary Cardiologist (physician) and Advanced Practice Providers (APPs -  Physician Assistants and Nurse Practitioners) who all work together to provide you with the care you need, when you need it. ? ?We recommend signing up for the patient portal called "MyChart".  Sign up information is provided on this After Visit Summary.  MyChart is used to connect with patients for Virtual Visits (Telemedicine).  Patients are able to view lab/test results, encounter notes, upcoming appointments, etc.  Non-urgent messages can be sent to your provider as well.   ?To learn more about what you can do with MyChart, go to [email or web address].   ? ?Your next appointment:   ?3 week(s) ? ?The format for your next appointment:   ?In Person ? ?Provider:   ?Buford Dresser, MD{ ? ?Important Information About Sugar ? ? ? ? ? ? ?

## 2022-01-14 DIAGNOSIS — R0989 Other specified symptoms and signs involving the circulatory and respiratory systems: Secondary | ICD-10-CM | POA: Diagnosis not present

## 2022-01-21 ENCOUNTER — Ambulatory Visit: Payer: BC Managed Care – PPO | Admitting: "Endocrinology

## 2022-01-21 DIAGNOSIS — G459 Transient cerebral ischemic attack, unspecified: Secondary | ICD-10-CM

## 2022-01-21 HISTORY — DX: Transient cerebral ischemic attack, unspecified: G45.9

## 2022-01-21 LAB — CATECHOLAMINES, FRACTIONATED, PLASMA
Dopamine: <30 pg/mL (ref 0–48)
Epinephrine: <15 pg/mL (ref 0–62)
Norepinephrine: 658 pg/mL (ref 0–874)

## 2022-01-21 LAB — ALDOSTERONE + RENIN ACTIVITY W/ RATIO
ALDOS/RENIN RATIO: >14.4 (ref 0.0–30.0)
ALDOSTERONE: 2.4 ng/dL (ref 0.0–30.0)
Renin: <0.167 ng/mL/hr — ABNORMAL LOW (ref 0.167–5.380)

## 2022-01-25 ENCOUNTER — Telehealth (HOSPITAL_BASED_OUTPATIENT_CLINIC_OR_DEPARTMENT_OTHER): Payer: Self-pay

## 2022-01-25 NOTE — Telephone Encounter (Addendum)
RN called patient and discussed the following results with the patient. Patient is okay to come and discuss them further with Dr. Harrell Gave and is scheduled for 5/16 with her.  ? ? ? ?----- Message from Buford Dresser, MD sent at 01/24/2022  9:37 AM EDT ----- ?The results are back. Here is how to interpret them: ? ?The catecholamine test is reassuring. Numbers were very normal and some low. This suggests against pheochromocytoma, though there is a small chance that we missed some spikes. We could consider a 24 hour urine test to make sure we aren't missing anything. ? ?The aldosterone/renin test was mixed. The renin level was very low, suggesting that it is being suppressed, but the aldosterone was normal. Typically when we are thinking about hyperaldosteronism, the aldosterone will be much higher and the renin will be low. However, the edarbi can affect these numbers. Because of this, it is more likely that you DO have elevated aldosterone with these numbers while on edarbi. ? ?We have two options we can do. We can try a medicine called spironolactone--if you do actually have hyperaldosteronism (despite the labs) this is the treatment. Or, we can do a 24 hour urine to look more closely for catecholamine elevations for pheochromocytoma. ? ?It may be easiest to go over all of the options and discuss more in a visit. I'm going to ask my team to reach out and schedule a visit with you. ?

## 2022-01-27 ENCOUNTER — Encounter (HOSPITAL_BASED_OUTPATIENT_CLINIC_OR_DEPARTMENT_OTHER): Payer: Self-pay | Admitting: Cardiology

## 2022-01-29 ENCOUNTER — Encounter: Payer: Self-pay | Admitting: Neurology

## 2022-01-29 ENCOUNTER — Ambulatory Visit: Payer: BC Managed Care – PPO | Admitting: Neurology

## 2022-01-29 VITALS — BP 137/88 | HR 78 | Ht 62.0 in | Wt 158.8 lb

## 2022-01-29 DIAGNOSIS — I10 Essential (primary) hypertension: Secondary | ICD-10-CM | POA: Diagnosis not present

## 2022-01-29 DIAGNOSIS — G459 Transient cerebral ischemic attack, unspecified: Secondary | ICD-10-CM

## 2022-01-29 NOTE — Progress Notes (Signed)
? ?[facility] ? ?PATIENT: [name] ?DOB: [birth year] ? ?REQUESTING CLINICIAN: Celene Squibb, MD ?HISTORY FROM: Patient/Husband  ?REASON FOR VISIT: TIA  ? ? ?HISTORICAL ? ?CHIEF COMPLAINT:  ?Chief Complaint  ?Patient presents with  ? New Patient (Initial Visit)  ?  Room 17 w/ husband, Richardson Landry. ED follow up for TIA, possible seizure. She took Plavix '75mg'$ , along with aspirin '81mg'$ , for three weeks following discharge. Now on aspirin alone.  ? ? ?HISTORY OF PRESENT ILLNESS:  ?This is a 56 year old woman past medical history of labile hypertension, asthma who is presenting after being admitted to the hospital for TIA work-up.  Patient presented to the hospital with left arm numbness/heaviness more than leg and right facial droop. She reported at that time her blood pressure was elevated too.  She had a complete stroke work-up including MRI brain with and without contrast, CTA head and neck and MRI cervical spine which were all negative for any acute findings.  She was diagnosed with TIA and started on DAPT (Aspirin and Plavix) for 21 days.  She completed DAPT and now is on aspirin alone.   ?On further work-up patient reports she has history of left arm heaviness and numbness associated with elevated blood pressure for the past 4 years but now her symptoms are increasing.  She did have a routine EEG which was negative for any epileptiform discharge.  She is being worked up for pheochromocytoma.   ?She works at [employer], in the garden section.  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  She reports during strenuous activity she can also have a spike in blood pressure.  Since leaving the hospital she has not worked and has not had any additional episodes. ? ? ?OTHER MEDICAL CONDITIONS: Hypertension, Asthma  ? ? ?REVIEW OF SYSTEMS: Full 14 system review of systems performed and negative with exception of: as noted in the HPI  ? ?ALLERGIES: ?Allergies  ?Allergen  Reactions  ? Bee Venom Anaphylaxis  ? Codeine Nausea And Vomiting and Rash  ? Penicillins Nausea And Vomiting and Rash  ? Prilosec [Omeprazole] Swelling and Rash  ? Zantac [Ranitidine] Rash  ? ? ?HOME MEDICATIONS: ?Outpatient Medications Prior to Visit  ?Medication Sig Dispense Refill  ? acetaminophen (TYLENOL) 500 MG tablet Take 1,000 mg by mouth every 6 (six) hours as needed for pain.    ? albuterol (PROVENTIL HFA;VENTOLIN HFA) 108 (90 BASE) MCG/ACT inhaler Inhale 2 puffs into the lungs every 6 (six) hours as needed for shortness of breath.     ? ALPRAZolam (XANAX) 0.5 MG tablet Take 0.5 mg by mouth 3 (three) times daily.    ? aspirin EC 81 MG EC tablet Take 1 tablet (81 mg total) by mouth daily with breakfast. Swallow whole. 30 tablet 3  ? atorvastatin (LIPITOR) 20 MG tablet Take 20 mg by mouth at bedtime.    ? EDARBI 80 MG TABS Take 1 tablet by mouth at bedtime.    ? hydrALAZINE (APRESOLINE) 10 MG tablet Take 1 tablet (10 mg total) by mouth every 6 (six) hours as needed (blood pressure (top number) more than 160). 90 tablet 3  ? nebivolol (BYSTOLIC) 10 MG tablet Take 1 tablet (10 mg total) by mouth daily. 90 tablet 3  ? VRAYLAR 1.5 MG capsule Take 3 mg by mouth daily.    ? ?No facility-administered medications prior to visit.  ? ? ?PAST MEDICAL HISTORY: ?Past Medical History:  ?  Diagnosis Date  ? Asthma   ? COPD (chronic obstructive pulmonary disease) ([place])   ? Depression   ? Seasonal allergies   ? ? ?PAST SURGICAL HISTORY: ?Past Surgical History:  ?Procedure Laterality Date  ? ABDOMINAL HYSTERECTOMY    ? CHOLECYSTECTOMY    ? COLONOSCOPY  05/21/2011  ? Procedure: COLONOSCOPY;  Surgeon: Rogene Houston, MD;  [facility];  Service: Endoscopy;  Laterality: N/A;  12:30  ? ESOPHAGOGASTRODUODENOSCOPY    ? TUBAL LIGATION    ? ? ?FAMILY HISTORY: ?Family History  ?Problem Relation Age of Onset  ? Hypertension Mother   ? Hyperlipidemia Mother   ? Stroke Mother   ? Kidney cancer Father   ? Colon cancer Other    ? ? ?SOCIAL HISTORY: ?Social History  ? ?Socioeconomic History  ? Marital status: Married  ?  Spouse name: Not on file  ? Number of children: 2  ? Years of education: 76  ? Highest education level: High school graduate  ?Occupational History  ? Occupation: garden center  ?Tobacco Use  ? Smoking status: Every Day  ?  Packs/day: 1.00  ?  Years: 23.00  ?  Pack years: 23.00  ?  Types: Cigarettes  ? Smokeless tobacco: Never  ?Vaping Use  ? Vaping Use: Every day  ?Substance and Sexual Activity  ? Alcohol use: No  ? Drug use: No  ? Sexual activity: Yes  ?  Birth control/protection: Surgical  ?Other Topics Concern  ? Not on file  ?Social History Narrative  ? Lives at home with husband.  ? Right-handed.  ? 2 cups of caffeine per day.  ? ?Social Determinants of Health  ? ?Financial Resource Strain: Not on file  ?Food Insecurity: Not on file  ?Transportation Needs: Not on file  ?Physical Activity: Not on file  ?Stress: Not on file  ?Social Connections: Not on file  ?Intimate Partner Violence: Not on file  ? ? ?PHYSICAL EXAM ? ? ?GENERAL EXAM/CONSTITUTIONAL: ?Vitals:  ?Vitals:  ? 01/29/22 0749  ?BP: 137/88  ?Pulse: 78  ?Weight: 158 lb 12.8 oz (72 kg)  ?Height: '5\' 2"'$  (1.575 m)  ? ?Body mass index is 29.04 kg/m?. ?Wt Readings from Last 3 Encounters:  ?01/29/22 158 lb 12.8 oz (72 kg)  ?01/10/22 156 lb 8 oz (71 kg)  ?01/08/22 156 lb 6.4 oz (70.9 kg)  ? ?Patient is in no distress; well developed, nourished and groomed; neck is supple ? ?EYES: ?Pupils round and reactive to light, Visual fields full to confrontation, Extraocular movements intacts,  ? ?MUSCULOSKELETAL: ?Gait, strength, tone, movements noted in Neurologic exam below ? ?NEUROLOGIC: ?MENTAL STATUS:  ?   ? View : No data to display.  ?  ?  ?  ? ?awake, alert, oriented to person, place and time ?recent and remote memory intact ?normal attention and concentration ?language fluent, comprehension intact, naming intact ?fund of knowledge appropriate ? ?CRANIAL NERVE:  ?2nd,  3rd, 4th, 6th - pupils equal and reactive to light, visual fields full to confrontation, extraocular muscles intact, no nystagmus ?5th - facial sensation symmetric ?7th - facial strength symmetric ?8th - hearing intact ?9th - palate elevates symmetrically, uvula midline ?11th - shoulder shrug symmetric ?12th - tongue protrusion midline ? ?MOTOR:  ?normal bulk and tone, full strength in the BUE, BLE ? ?SENSORY:  ?normal and symmetric to light touch, pinprick, temperature, vibration ? ?COORDINATION:  ?finger-nose-finger, fine finger movements normal ? ?REFLEXES:  ?deep tendon reflexes present and symmetric ? ?GAIT/STATION:  ?  normal ? ? ?DIAGNOSTIC DATA (LABS, IMAGING, TESTING) ?- I reviewed patient records, labs, notes, testing and imaging myself where available. ? ?Lab Results  ?Component Value Date  ? WBC 7.5 01/02/2022  ? HGB 13.2 01/02/2022  ? HCT 39.5 01/02/2022  ? MCV 96.6 01/02/2022  ? PLT 166 01/02/2022  ? ?   ?Component Value Date/Time  ? NA 140 01/02/2022 0447  ? NA 140 06/10/2019 1230  ? K 3.6 01/02/2022 0447  ? CL 105 01/02/2022 0447  ? CO2 26 01/02/2022 0447  ? GLUCOSE 145 (H) 01/02/2022 0447  ? BUN 13 01/02/2022 0447  ? BUN 12 06/10/2019 1230  ? CREATININE 0.71 01/02/2022 0447  ? CALCIUM 8.6 (L) 01/02/2022 0447  ? GFRNONAA >60 01/02/2022 0447  ? GFRAA 118 06/10/2019 1230  ? ?Lab Results  ?Component Value Date  ? CHOL 144 01/02/2022  ? HDL 48 01/02/2022  ? Carter 79 01/02/2022  ? TRIG 86 01/02/2022  ? CHOLHDL 3.0 01/02/2022  ? ?Lab Results  ?Component Value Date  ? HGBA1C 5.5 01/01/2022  ? ?No results found for: VITAMINB12 ?Lab Results  ?Component Value Date  ? TSH 0.911 01/01/2022  ? ? ?MRI Brain/Cervical Spine 01/01/2022 ?1. No acute intracranial abnormality small hyperintensities in the right frontal white matter and pons likely due to chronic ischemia ?2. Mild degenerative change cervical spine without neural impingement. No evidence of demyelinating disease in the cervical spinal cord.  ? ? ?CTA Head  and Neck  01/01/2022 ?Patent vasculature of the head and neck with no significant stenosis or occlusion. ? ? ?EEG 01/02/22 ?This study is within normal limits. No seizures or epileptiform discharges were se

## 2022-01-29 NOTE — Patient Instructions (Signed)
Continue current medications ?Follow-up with cardiology as scheduled ?Return as needed ?

## 2022-02-05 ENCOUNTER — Ambulatory Visit (HOSPITAL_BASED_OUTPATIENT_CLINIC_OR_DEPARTMENT_OTHER): Payer: BC Managed Care – PPO | Admitting: Cardiology

## 2022-02-05 ENCOUNTER — Encounter (HOSPITAL_BASED_OUTPATIENT_CLINIC_OR_DEPARTMENT_OTHER): Payer: Self-pay | Admitting: Cardiology

## 2022-02-05 VITALS — BP 144/86 | HR 83 | Ht 62.0 in | Wt 153.6 lb

## 2022-02-05 DIAGNOSIS — Z712 Person consulting for explanation of examination or test findings: Secondary | ICD-10-CM | POA: Diagnosis not present

## 2022-02-05 DIAGNOSIS — Z7189 Other specified counseling: Secondary | ICD-10-CM | POA: Diagnosis not present

## 2022-02-05 DIAGNOSIS — E269 Hyperaldosteronism, unspecified: Secondary | ICD-10-CM | POA: Diagnosis not present

## 2022-02-05 DIAGNOSIS — R0989 Other specified symptoms and signs involving the circulatory and respiratory systems: Secondary | ICD-10-CM | POA: Diagnosis not present

## 2022-02-05 MED ORDER — SPIRONOLACTONE 25 MG PO TABS: 25.0000 mg | ORAL_TABLET | Freq: Every day | ORAL | 3 refills | Status: DC

## 2022-02-05 NOTE — Patient Instructions (Signed)
Medication Instructions:  ?Start the spironolactone. Check your blood pressures. If your blood pressure is running consistently low, call and we will cut back on one of your other medications. Come back in 2-3 weeks for labs and a nurse BP check. ? ?*If you need a refill on your cardiac medications before your next appointment, please call your pharmacy* ? ? ?Lab Work: ?Your provider has recommended lab work in 2 weeks (BMP). Please have this collected at [facility] [place]. The lab is open 8:00 am - 4:30 pm. Please avoid 12:00p - 1:00p for lunch hour. You do not need an appointment. Please go to [address] [place], [place] [address]. This is in the Primary Care office on the 3rd floor, let them know you are there for blood work and they will direct you to the lab. ? ?If you have labs (blood work) drawn today and your tests are completely normal, you will receive your results only by: ?MyChart Message (if you have MyChart) OR ?A paper copy in the mail ?If you have any lab test that is abnormal or we need to change your treatment, we will call you to review the results. ? ? ?Testing/Procedures: ?None ordered today ? ? ?Follow-Up: ?At [facility], you and your health needs are our priority.  As part of our continuing mission to provide you with exceptional heart care, we have created designated Provider Care Teams.  These Care Teams include your primary Cardiologist (physician) and Advanced Practice Providers (APPs -  Physician Assistants and Nurse Practitioners) who all work together to provide you with the care you need, when you need it. ? ?We recommend signing up for the patient portal called "MyChart".  Sign up information is provided on this After Visit Summary.  MyChart is used to connect with patients for Virtual Visits (Telemedicine).  Patients are able to view lab/test results, encounter notes, upcoming appointments, etc.  Non-urgent messages can be sent to your provider  as well.   ?To learn more about what you can do with MyChart, go to [email or web address].   ? ?Your next appointment:   ?2 month(s) ? ?The format for your next appointment:   ?In Person ? ?Provider:   ?Buford Dresser, MD{ ? ? ?Important Information About Sugar ? ? ? ? ? ? ?Start the spironolactone. Check your blood pressures. If your blood pressure is running consistently low, call and we will cut back on one of your other medications. Come back in 2-3 weeks for labs and a nurse BP check. ?

## 2022-02-05 NOTE — Progress Notes (Signed)
?Cardiology Office Note:   ? ?Date:  02/05/2022  ? ?ID:  [name], DOB 29-Mar-1966, MRN [id number] ? ?PCP:  Celene Squibb, MD  ?Cardiologist:  Buford Dresser, MD PhD ? ?Referring MD: Celene Squibb, MD  ? ?CC: follow up ? ?History of Present Illness:   ? ?[name] is a 56 y.o. female with a hx of tobacco use, hypertension who is seen for follow up today. I initially met her 05/25/2019 as a new consult at the request of Celene Squibb, MD for the evaluation and management of dizziness, palpitations, left arm/chest tightness. ? ?Today: ?Blood pressures had been fairly stable until this weekend, then started to intermittently elevate with minimal activity.  ? ?We reviewed the lab results as follows: ?The catecholamine test is reassuring. Numbers were very normal and some low. This suggests against pheochromocytoma, though there is a small chance that we missed some spikes. We could consider a 24 hour urine test to make sure we aren't missing anything. ?  ?The aldosterone/renin test was mixed. The renin level was very low, suggesting that it is being suppressed, but the aldosterone was normal. Typically when we are thinking about hyperaldosteronism, the aldosterone will be much higher and the renin will be low. However, the edarbi can affect these numbers. Because of this, it is more likely that you DO have elevated aldosterone with these numbers while on edarbi. ?  ?Lowest BP she has seen has been ~90/70, but most in 130s and occasionally 150s. Has not required PRN hydralazine. BP gets better when she relaxes, drops >20 points. ? ?Denies chest pain, shortness of breath at rest or with normal exertion. No PND, orthopnea, LE edema or unexpected weight gain. No syncope or palpitations.  ? ?Past Medical History:  ?Diagnosis Date  ? Asthma   ? COPD (chronic obstructive pulmonary disease) ([place])   ? Depression   ? Seasonal allergies   ? ? ?Past Surgical History:  ?Procedure Laterality Date  ? ABDOMINAL HYSTERECTOMY     ? CHOLECYSTECTOMY    ? COLONOSCOPY  05/21/2011  ? Procedure: COLONOSCOPY;  Surgeon: Rogene Houston, MD;  [facility];  Service: Endoscopy;  Laterality: N/A;  12:30  ? ESOPHAGOGASTRODUODENOSCOPY    ? TUBAL LIGATION    ? ? ?Current Medications: ?Current Outpatient Medications on File Prior to Visit  ?Medication Sig  ? acetaminophen (TYLENOL) 500 MG tablet Take 1,000 mg by mouth every 6 (six) hours as needed for pain.  ? albuterol (PROVENTIL HFA;VENTOLIN HFA) 108 (90 BASE) MCG/ACT inhaler Inhale 2 puffs into the lungs every 6 (six) hours as needed for shortness of breath.   ? ALPRAZolam (XANAX) 0.5 MG tablet Take 0.5 mg by mouth 3 (three) times daily.  ? aspirin EC 81 MG EC tablet Take 1 tablet (81 mg total) by mouth daily with breakfast. Swallow whole.  ? atorvastatin (LIPITOR) 20 MG tablet Take 20 mg by mouth at bedtime.  ? EDARBI 80 MG TABS Take 1 tablet by mouth at bedtime.  ? hydrALAZINE (APRESOLINE) 10 MG tablet Take 1 tablet (10 mg total) by mouth every 6 (six) hours as needed (blood pressure (top number) more than 160).  ? nebivolol (BYSTOLIC) 10 MG tablet Take 1 tablet (10 mg total) by mouth daily.  ? VRAYLAR 3 MG capsule Take 3 mg by mouth daily.  ? ?No current facility-administered medications on file prior to visit.  ?  ? ?Allergies:   Bee venom, Codeine, Penicillins, Prilosec [omeprazole], and  Zantac [ranitidine]  ? ?Social History  ? ?Tobacco Use  ? Smoking status: Every Day  ?  Packs/day: 1.00  ?  Years: 23.00  ?  Pack years: 23.00  ?  Types: Cigarettes  ? Smokeless tobacco: Never  ?Vaping Use  ? Vaping Use: Every day  ?Substance Use Topics  ? Alcohol use: No  ? Drug use: No  ?  ? ? ?Family History: ?The patient's family history includes Colon cancer in an other family member; Hyperlipidemia in her mother; Hypertension in her mother; Kidney cancer in her father; Stroke in her mother. mother had a stroke at age 32, 71 had CHF, [name] had heart and blood pressure problems. Everyone else  has cancer. ? ?ROS:   ?Please see the history of present illness.  Additional pertinent ROS otherwise unremarkable. ? ? ?EKGs/Labs/Other Studies Reviewed:   ? ?The following studies were reviewed today: ?Echo 01/02/22 ? 1. Left ventricular ejection fraction, by estimation, is 50 to 55%. The  ?left ventricle has low normal function. The left ventricle has no regional  ?wall motion abnormalities. Left ventricular diastolic parameters were  ?normal.  ? 2. Right ventricular systolic function is normal. The right ventricular  ?size is normal. There is normal pulmonary artery systolic pressure.  ? 3. The mitral valve is normal in structure. No evidence of mitral valve  ?regurgitation. No evidence of mitral stenosis.  ? 4. The aortic valve is tricuspid. Aortic valve regurgitation is not  ?visualized. No aortic stenosis is present.  ? 5. The inferior vena cava is normal in size with greater than 50%  ?respiratory variability, suggesting right atrial pressure of 3 mmHg.  ? ?Comparison(s): No prior Echocardiogram.  ? ?CTA Head/Neck 01/01/22 ?IMPRESSION: ?Patent vasculature of the head and neck with no significant stenosis ?or occlusion. ? ?CT Head 01/01/22 ?IMPRESSION: ?1. No acute intracranial pathology. ?2. ASPECTS is 10 ?3. Empty sella. This finding is nonspecific and could reflect a ?normal variant common but can also be seen in the setting of ?intracranial hypertension. ? ?Blood Pressure Monitor 07/09/21 ?24 ambulatory summary: ?Mean BP 132/77, HR 81 ?Awake average 140/84, HR 82 ?Asleep average 111/62, HR 78 ?  ?Dip with sleep: systolic 28%, diastolic 76% ?  ?Max systolic: 811 ?Max diastolic: 572 ?  ?Normal BP variability, average BP near goal. ? ?CT coronary 06/16/2019 ?FINDINGS: ?Coronary calcium score: The patient's coronary artery calcium score ?is 0, which places the patient in the 0 percentile. ?  ?Coronary arteries: Normal coronary origins.  Right dominance. ?  ?Right Coronary Artery: Medium caliber vessel that give  rise to PLB ?and PDA. No significant plaque or stenosis. ?  ?Left Main Coronary Artery: No significant plaque or stenosis. ?  ?Left Anterior Descending Coronary Artery: Gives rise to two medium ?sized diagonals. No significant plaque or stenosis in LAD or branch ?vessels. ?  ?Left Circumflex Artery: Gives rise to two large OM branches. No ?significant plaque or stenosis in LCx or branch vessels. ?  ?Aorta: Normal size, 27 mm at the mid ascending aorta (level of the ?PA bifurcation) measured double oblique. No calcifications. No ?dissection. ?  ?Aortic Valve: No calcifications. Trileaflet. ?  ?Other findings:  ?Normal pulmonary vein drainage into the left atrium.  ?Normal left atrial appendage without a thrombus.  ?Normal size of the pulmonary artery. ?  ?IMPRESSION: ?1. No evidence of CAD, CADRADS = 0.  ?2. Coronary calcium score of 0. This was 0 percentile for age and ?sex matched control.  ?3. Normal  coronary origin with right dominance. ? ?Event monitor 08/30/2019 ?12 days of data recorded on Zio monitor. Patient had a min HR of 58 bpm, max HR of 184 bpm, and avg HR of 82 bpm. Predominant underlying rhythm was Sinus Rhythm. No VT, atrial fibrillation, high degree block, or pauses noted. Isolated atrial and ventricular ectopy was rare (<1%). There were 19 triggered events. There were 6 SVT events noted, all very brief (longest 5 beats). These were detected near patient triggered event markers. ? ?Carotid u/s 06/07/2019 ?RIGHT CAROTID ARTERY: No significant calcifications of the right ?common carotid artery. Intermediate waveform maintained. ?Heterogeneous and partially calcified plaque at the right carotid ?bifurcation. No significant lumen shadowing. Low resistance waveform ?of the right ICA. No significant tortuosity. ?  ?RIGHT VERTEBRAL ARTERY: Antegrade flow with low resistance waveform. ?  ?LEFT CAROTID ARTERY: No significant calcifications of the left ?common carotid artery. Intermediate waveform  maintained. ?Heterogeneous and partially calcified plaque at the left carotid ?bifurcation without significant lumen shadowing. Low resistance ?waveform of the left ICA. No significant tortuosity. ?  ?LEFT VERTEBRAL

## 2022-02-19 ENCOUNTER — Ambulatory Visit (INDEPENDENT_AMBULATORY_CARE_PROVIDER_SITE_OTHER): Payer: BC Managed Care – PPO | Admitting: *Deleted

## 2022-02-19 VITALS — BP 108/86 | Ht 62.0 in | Wt 150.4 lb

## 2022-02-19 DIAGNOSIS — R0989 Other specified symptoms and signs involving the circulatory and respiratory systems: Secondary | ICD-10-CM | POA: Diagnosis not present

## 2022-02-19 DIAGNOSIS — I10 Essential (primary) hypertension: Secondary | ICD-10-CM

## 2022-02-19 NOTE — Progress Notes (Signed)
   Nurse Visit   Date of Encounter: 02/19/2022 ID: [name], DOB [birth year], MRN [id number]  PCP:  Celene Squibb, MD   [facility] Providers Cardiologist:  Buford Dresser, MD      Visit Details   VS:  BP 108/86   Ht '5\' 2"'$  (1.575 m)   Wt 150 lb 6.4 oz (68.2 kg)   BMI 27.51 kg/m  , BMI Body mass index is 27.51 kg/m.  Wt Readings from Last 3 Encounters:  02/19/22 150 lb 6.4 oz (68.2 kg)  02/05/22 153 lb 9.6 oz (69.7 kg)  01/29/22 158 lb 12.8 oz (72 kg)     Reason for visit: FOLLOW UP BLOOD PRESSURE CHECK  Performed today: Vitals and Education BLOOD PRESSURE 108/86, per patient has been running 107-115/80's-low 90's with occasional SBP in 120's. Patient will continue to monitor blood pressure at home and call if any increasing blood pressure . Patient had labs today before coming for blood pressure check.  Changes (medications, testing, etc.) : NONE Length of Visit: 15 minutes  Signed, Alvina Filbert, LPN  12/14/4008 2:72 PM

## 2022-02-19 NOTE — Patient Instructions (Signed)
Medication Instructions:  Your physician recommends that you continue on your current medications as directed. Please refer to the Current Medication list given to you today.  Labwork: NONE  Testing/Procedures: NONE  Follow-Up: AS SCHEDULED

## 2022-02-20 LAB — BASIC METABOLIC PANEL
BUN/Creatinine Ratio: 9 (ref 9–23)
BUN: 7 mg/dL (ref 6–24)
CO2: 22 mmol/L (ref 20–29)
Calcium: 9.6 mg/dL (ref 8.7–10.2)
Chloride: 103 mmol/L (ref 96–106)
Creatinine, Ser: 0.77 mg/dL (ref 0.57–1.00)
Glucose: 106 mg/dL — ABNORMAL HIGH (ref 70–99)
Potassium: 4.4 mmol/L (ref 3.5–5.2)
Sodium: 140 mmol/L (ref 134–144)
eGFR: 92 mL/min/{1.73_m2} (ref >59)

## 2022-03-04 ENCOUNTER — Telehealth: Payer: Self-pay | Admitting: Cardiology

## 2022-03-04 NOTE — Telephone Encounter (Signed)
Pt c/o BP issue: STAT if pt c/o blurred vision, one-sided weakness or slurred speech  1. What are your last 5 BP readings?  92/77 HR 101 - Saturday and Sunday 95/78 HR 81- Today  2. Are you having any other symptoms (ex. Dizziness, headache, blurred vision, passed out)? Dizziness, lightheaded  3. What is your BP issue? Pt states that BP has been running low and has her concerned. Please advise

## 2022-03-04 NOTE — Telephone Encounter (Signed)
Please advise for any changes

## 2022-03-06 DIAGNOSIS — I959 Hypotension, unspecified: Secondary | ICD-10-CM | POA: Diagnosis not present

## 2022-04-08 NOTE — Telephone Encounter (Signed)
Pt updated and verbalized understanding.  

## 2022-04-08 NOTE — Telephone Encounter (Signed)
She is coming in next week for a visit--can we ask her to bring in a BP log with her? If she is still feeling lightheaded we can do orthostatics at the visit too. Thanks.

## 2022-04-10 ENCOUNTER — Other Ambulatory Visit (HOSPITAL_COMMUNITY): Payer: Self-pay | Admitting: Family Medicine

## 2022-04-10 ENCOUNTER — Other Ambulatory Visit: Payer: Self-pay | Admitting: Family Medicine

## 2022-04-10 DIAGNOSIS — R112 Nausea with vomiting, unspecified: Secondary | ICD-10-CM | POA: Diagnosis not present

## 2022-04-10 DIAGNOSIS — R630 Anorexia: Secondary | ICD-10-CM | POA: Diagnosis not present

## 2022-04-11 ENCOUNTER — Emergency Department (HOSPITAL_COMMUNITY): Payer: BC Managed Care – PPO

## 2022-04-11 ENCOUNTER — Encounter (HOSPITAL_COMMUNITY): Payer: Self-pay

## 2022-04-11 ENCOUNTER — Other Ambulatory Visit: Payer: Self-pay

## 2022-04-11 ENCOUNTER — Emergency Department (HOSPITAL_COMMUNITY)
Admission: EM | Admit: 2022-04-11 | Discharge: 2022-04-11 | Disposition: A | Discharge status: Home / Self Care | Payer: BC Managed Care – PPO | Attending: Emergency Medicine | Admitting: Emergency Medicine

## 2022-04-11 DIAGNOSIS — R42 Dizziness and giddiness: Secondary | ICD-10-CM | POA: Diagnosis not present

## 2022-04-11 DIAGNOSIS — M79604 Pain in right leg: Secondary | ICD-10-CM | POA: Diagnosis not present

## 2022-04-11 DIAGNOSIS — M79605 Pain in left leg: Secondary | ICD-10-CM | POA: Insufficient documentation

## 2022-04-11 DIAGNOSIS — R5383 Other fatigue: Secondary | ICD-10-CM | POA: Insufficient documentation

## 2022-04-11 DIAGNOSIS — K449 Diaphragmatic hernia without obstruction or gangrene: Secondary | ICD-10-CM | POA: Diagnosis not present

## 2022-04-11 DIAGNOSIS — R531 Weakness: Secondary | ICD-10-CM | POA: Insufficient documentation

## 2022-04-11 DIAGNOSIS — Z7982 Long term (current) use of aspirin: Secondary | ICD-10-CM | POA: Insufficient documentation

## 2022-04-11 DIAGNOSIS — K5730 Diverticulosis of large intestine without perforation or abscess without bleeding: Secondary | ICD-10-CM | POA: Diagnosis not present

## 2022-04-11 DIAGNOSIS — I10 Essential (primary) hypertension: Secondary | ICD-10-CM | POA: Diagnosis not present

## 2022-04-11 DIAGNOSIS — R112 Nausea with vomiting, unspecified: Secondary | ICD-10-CM | POA: Diagnosis not present

## 2022-04-11 DIAGNOSIS — K769 Liver disease, unspecified: Secondary | ICD-10-CM | POA: Diagnosis not present

## 2022-04-11 LAB — COMPREHENSIVE METABOLIC PANEL
ALT: 17 U/L (ref 0–44)
AST: 17 U/L (ref 15–41)
Albumin: 4.6 g/dL (ref 3.5–5.0)
Alkaline Phosphatase: 46 U/L (ref 38–126)
Anion gap: 12 (ref 5–15)
BUN: 9 mg/dL (ref 6–20)
CO2: 25 mmol/L (ref 22–32)
Calcium: 9.6 mg/dL (ref 8.9–10.3)
Chloride: 102 mmol/L (ref 98–111)
Creatinine, Ser: 0.79 mg/dL (ref 0.44–1.00)
GFR, Estimated: >60 mL/min (ref >60)
Glucose, Bld: 91 mg/dL (ref 70–99)
Potassium: 3.9 mmol/L (ref 3.5–5.1)
Sodium: 139 mmol/L (ref 135–145)
Total Bilirubin: 1.8 mg/dL — ABNORMAL HIGH (ref 0.3–1.2)
Total Protein: 7.7 g/dL (ref 6.5–8.1)

## 2022-04-11 LAB — CBC WITH DIFFERENTIAL/PLATELET
Abs Immature Granulocytes: 0.03 10*3/uL (ref 0.00–0.07)
Basophils Absolute: 0.1 10*3/uL (ref 0.0–0.1)
Basophils Relative: 1 %
Eosinophils Absolute: 0.1 10*3/uL (ref 0.0–0.5)
Eosinophils Relative: 1 %
HCT: 50.1 % — ABNORMAL HIGH (ref 36.0–46.0)
Hemoglobin: 16.6 g/dL — ABNORMAL HIGH (ref 12.0–15.0)
Immature Granulocytes: 0 %
Lymphocytes Relative: 22 %
Lymphs Abs: 2.1 10*3/uL (ref 0.7–4.0)
MCH: 32 pg (ref 26.0–34.0)
MCHC: 33.1 g/dL (ref 30.0–36.0)
MCV: 96.7 fL (ref 80.0–100.0)
Monocytes Absolute: 0.5 10*3/uL (ref 0.1–1.0)
Monocytes Relative: 5 %
Neutro Abs: 6.9 10*3/uL (ref 1.7–7.7)
Neutrophils Relative %: 71 %
RBC: 5.18 MIL/uL — ABNORMAL HIGH (ref 3.87–5.11)
RDW: 13.3 % (ref 11.5–15.5)
WBC Morphology: >10
WBC: 9.8 10*3/uL (ref 4.0–10.5)
nRBC: 0 % (ref 0.0–0.2)

## 2022-04-11 LAB — LIPASE, BLOOD: Lipase: 27 U/L (ref 11–51)

## 2022-04-11 LAB — URINALYSIS, ROUTINE W REFLEX MICROSCOPIC
Bacteria, UA: NONE SEEN
Bilirubin Urine: NEGATIVE
Glucose, UA: NEGATIVE mg/dL
Hgb urine dipstick: NEGATIVE
Ketones, ur: 20 mg/dL — AB
Nitrite: NEGATIVE
Protein, ur: NEGATIVE mg/dL
Specific Gravity, Urine: 1.01 (ref 1.005–1.030)
pH: 5 (ref 5.0–8.0)

## 2022-04-11 MED ORDER — METOCLOPRAMIDE HCL 10 MG PO TABS: 10.0000 mg | ORAL_TABLET | Freq: Four times a day (QID) | ORAL | 0 refills | Status: DC

## 2022-04-11 MED ORDER — METOCLOPRAMIDE HCL 5 MG/ML IJ SOLN
10.0000 mg | Freq: Once | INTRAMUSCULAR | Status: AC
  Administered 2022-04-11: 10 mg via INTRAVENOUS
  Filled 2022-04-11: qty 2

## 2022-04-11 MED ORDER — IOHEXOL 300 MG/ML  SOLN
100.0000 mL | Freq: Once | INTRAMUSCULAR | Status: AC | PRN
  Administered 2022-04-11: 100 mL via INTRAVENOUS

## 2022-04-11 MED ORDER — SODIUM CHLORIDE 0.9 % IV BOLUS
1000.0000 mL | Freq: Once | INTRAVENOUS | Status: AC
  Administered 2022-04-11: 1000 mL via INTRAVENOUS

## 2022-04-11 NOTE — ED Provider Notes (Incomplete)
[facility] EMERGENCY DEPARTMENT Provider Note   CSN: [id number] Arrival date & time: 04/11/22  1504     History {Add pertinent medical, surgical, social history, OB history to HPI:1} Chief Complaint  Patient presents with  . Emesis    [name] is a 56 y.o. female with a history significant for hypertension, SVT, history of TIA and GERD presenting with a 4-week history of nausea, vomiting and increasing weakness with weight loss.  She states initially she was able to tolerate fluids but was unable to tolerate solid foods, over the past week fluids have also been vomited.  She reports generalized fatigue, she denies abdominal pain, attempts to eat worsens her nausea.  She has had no fevers or chills.  She was seen by her PCP yesterday who told her she was dehydrated and put her on Zofran but patient states the Zofran seemed to make her nausea and vomiting worse.  She has developed cramping pain in her legs today and additionally now feels lightheaded.  She has found no alleviators for her symptoms.  She denies diarrhea or constipation.  No dysuria, states she has not urinated since 6 AM this morning.  Surgical history significant for abdominal hysterectomy and cholecystectomy.  The history is provided by the patient.       Home Medications Prior to Admission medications   Medication Sig Start Date End Date Taking? Authorizing Provider  acetaminophen (TYLENOL) 500 MG tablet Take 1,000 mg by mouth every 6 (six) hours as needed for pain.   Yes [provider]  albuterol (PROVENTIL HFA;VENTOLIN HFA) 108 (90 BASE) MCG/ACT inhaler Inhale 2 puffs into the lungs every 6 (six) hours as needed for shortness of breath.    Yes [provider]  ALPRAZolam Duanne Moron) 0.5 MG tablet Take 0.5 mg by mouth 3 (three) times daily. 04/13/21  Yes [provider]  aspirin EC 81 MG EC tablet Take 1 tablet (81 mg total) by mouth daily with breakfast. Swallow whole. 01/03/22  Yes Barton Dubois, MD  metoCLOPramide (REGLAN) 10 MG tablet Take 1 tablet (10 mg total) by mouth every 6 (six) hours. 04/11/22  Yes Yohance Hathorne, Almyra Free, PA-C  nebivolol (BYSTOLIC) 10 MG tablet Take 1 tablet (10 mg total) by mouth daily. 01/08/22 01/03/23 Yes Nida, Marella Chimes, MD  spironolactone (ALDACTONE) 25 MG tablet Take 1 tablet (25 mg total) by mouth daily. 02/05/22 01/31/23 Yes Buford Dresser, MD  VRAYLAR 3 MG capsule Take 3 mg by mouth daily. 01/23/22  Yes [provider]  atorvastatin (LIPITOR) 20 MG tablet Take 20 mg by mouth at bedtime. Patient not taking: Reported on 04/11/2022 12/05/21   [provider]  EDARBI 80 MG TABS Take 1 tablet by mouth at bedtime. 03/14/21   [provider]      Allergies    Bee venom, Codeine, Penicillins, Prilosec [omeprazole], and Zantac [ranitidine]    Review of Systems   Review of Systems  Constitutional:  Negative for chills and fever.  HENT:  Negative for congestion and sore throat.   Eyes: Negative.   Respiratory:  Negative for chest tightness and shortness of breath.   Cardiovascular:  Negative for chest pain.  Gastrointestinal:  Positive for nausea and vomiting. Negative for abdominal pain, constipation and diarrhea.  Genitourinary:  Positive for decreased urine volume. Negative for dysuria.  Musculoskeletal:  Negative for arthralgias, joint swelling and neck pain.  Skin: Negative.  Negative for rash and wound.  Neurological:  Positive for light-headedness. Negative for dizziness,  weakness, numbness and headaches.  Psychiatric/Behavioral: Negative.      Physical Exam Updated Vital Signs BP 125/72   Pulse 91   Temp 97.6 F (36.4 C) (Oral)   Resp 20   Ht '5\' 3"'$  (1.6 m)   Wt 62.1 kg   SpO2 93%   BMI 24.27 kg/m  Physical Exam Vitals and nursing note reviewed.  Constitutional:      Appearance: She is well-developed.  HENT:     Head: Normocephalic and atraumatic.  Eyes:     Conjunctiva/sclera: Conjunctivae normal.   Cardiovascular:     Rate and Rhythm: Normal rate and regular rhythm.     Heart sounds: Normal heart sounds.  Pulmonary:     Effort: Pulmonary effort is normal.     Breath sounds: Normal breath sounds. No wheezing.  Abdominal:     General: Bowel sounds are normal.     Palpations: Abdomen is soft.     Tenderness: There is no abdominal tenderness.  Musculoskeletal:        General: Normal range of motion.     Cervical back: Normal range of motion.  Skin:    General: Skin is warm and dry.  Neurological:     Mental Status: She is alert.     ED Results / Procedures / Treatments   Labs (all labs ordered are listed, but only abnormal results are displayed) Labs Reviewed  URINALYSIS, ROUTINE W REFLEX MICROSCOPIC - Abnormal; Notable for the following components:      Result Value   Ketones, ur 20 (*)    Leukocytes,Ua MODERATE (*)    All other components within normal limits  COMPREHENSIVE METABOLIC PANEL - Abnormal; Notable for the following components:   Total Bilirubin 1.8 (*)    All other components within normal limits  CBC WITH DIFFERENTIAL/PLATELET - Abnormal; Notable for the following components:   RBC 5.18 (*)    Hemoglobin 16.6 (*)    HCT 50.1 (*)    All other components within normal limits  LIPASE, BLOOD    EKG None  Radiology CT ABDOMEN PELVIS W CONTRAST  Result Date: 04/11/2022 CLINICAL DATA:  Nausea/vomiting, weakness x 30 days, dehydrated EXAM: CT ABDOMEN AND PELVIS WITH CONTRAST TECHNIQUE: Multidetector CT imaging of the abdomen and pelvis was performed using the standard protocol following bolus administration of intravenous contrast. RADIATION DOSE REDUCTION: This exam was performed according to the departmental dose-optimization program which includes automated exposure control, adjustment of the mA and/or kV according to patient size and/or use of iterative reconstruction technique. CONTRAST:  137m OMNIPAQUE IOHEXOL 300 MG/ML  SOLN COMPARISON:  CT  abdomen/pelvis dated 12/19/2004 FINDINGS: Lower chest: Emphysematous changes at the lung bases. Hepatobiliary: 10 mm hypoenhancing lesion in the right hepatic dome (series 2/image 8). This is favored to be present in 2006 (although possibly larger on the prior), compatible with a benign etiology. Status post cholecystectomy. No intrahepatic or extrahepatic ductal dilatation. Pancreas: Within normal limits. Spleen: Within normal limits. Adrenals/Urinary Tract: Adrenal glands are within normal limits. Subcentimeter bilateral renal cysts. Left lower pole renal scarring. No hydronephrosis. Bladder is mildly thick-walled although underdistended. Stomach/Bowel: Stomach is notable for a tiny hiatal hernia. No evidence of bowel obstruction. Normal appendix (series 2/image 45). Mild sigmoid diverticulosis, without evidence of diverticulitis. Vascular/Lymphatic: No evidence of abdominal aortic aneurysm. Atherosclerotic calcifications of the abdominal aorta and branch vessels. No suspicious abdominopelvic lymphadenopathy. Reproductive: Status post hysterectomy. Bilateral ovaries are within normal limits. Other: No abdominopelvic ascites. Musculoskeletal: Visualized osseous structures are within  normal limits. IMPRESSION: No evidence of bowel obstruction. Normal appendix. Mild sigmoid diverticulosis, without evidence of diverticulitis. Status post cholecystectomy and hysterectomy. Electronically Signed   By: Julian Hy M.D.   On: 04/11/2022 19:10    Procedures Procedures  {Document cardiac monitor, telemetry assessment procedure when appropriate:1}  Medications Ordered in ED Medications  sodium chloride 0.9 % bolus 1,000 mL (1,000 mLs Intravenous New Bag/Given 04/11/22 1706)  metoCLOPramide (REGLAN) injection 10 mg (10 mg Intravenous Given 04/11/22 1706)  sodium chloride 0.9 % bolus 1,000 mL (1,000 mLs Intravenous New Bag/Given 04/11/22 1831)  iohexol (OMNIPAQUE) 300 MG/ML solution 100 mL (100 mLs Intravenous  Contrast Given 04/11/22 1844)    ED Course/ Medical Decision Making/ A&P                           Medical Decision Making Amount and/or Complexity of Data Reviewed Labs: ordered. Radiology: ordered.  Risk Prescription drug management.   ***  {Document critical care time when appropriate:1} {Document review of labs and clinical decision tools ie heart score, Chads2Vasc2 etc:1}  {Document your independent review of radiology images, and any outside records:1} {Document your discussion with family members, caretakers, and with consultants:1} {Document social determinants of health affecting pt's care:1} {Document your decision making why or why not admission, treatments were needed:1} Final Clinical Impression(s) / ED Diagnoses Final diagnoses:  Nausea and vomiting, unspecified vomiting type    Rx / DC Orders ED Discharge Orders          Ordered    metoCLOPramide (REGLAN) 10 MG tablet  Every 6 hours        04/11/22 1948

## 2022-04-11 NOTE — ED Triage Notes (Signed)
Pt reports n/v/weakness x30day. Pt states she went to her PCP yesterday and they told her that she was dehydrated. Pt has a CT scan scheduled for Aug 20, 20223.  Pt developed leg cramps today. Pt states she is unable to keep anything down including water. Pt also reports feeling lightheaded.

## 2022-04-11 NOTE — Discharge Instructions (Addendum)
Your lab test and your CT scan today are reassuring with no significant found abnormalities.  You are being prescribed some medication to help you with the nausea and I also recommend you continue taking Pepcid to help protect your stomach lining.  Call Dr. Gala Romney for further evaluation of your symptoms, he is our local gastroenterologist.

## 2022-04-12 ENCOUNTER — Telehealth: Payer: Self-pay | Admitting: Internal Medicine

## 2022-04-12 NOTE — Telephone Encounter (Signed)
Ok to schedule patient referral

## 2022-04-12 NOTE — Telephone Encounter (Signed)
ER REFERRAL  

## 2022-04-13 NOTE — ED Provider Notes (Signed)
[facility] EMERGENCY DEPARTMENT Provider Note   CSN: [id number] Arrival date & time: 04/11/22  1504     History  Chief Complaint  Patient presents with   Emesis     [name] is a 56 y.o. female with a history significant for hypertension, SVT, history of TIA and GERD presenting with a 4-week history of nausea, vomiting and increasing weakness with weight loss.  She states initially she was able to tolerate fluids but was unable to tolerate solid foods, over the past week fluids have also been vomited.  She reports generalized fatigue, she denies abdominal pain, attempts to eat worsens her nausea.  She has had no fevers or chills.  She was seen by her PCP yesterday who told her she was dehydrated and put her on Zofran but patient states the Zofran seemed to make her nausea and vomiting worse.  She has developed cramping pain in her legs today and additionally now feels lightheaded.  She has found no alleviators for her symptoms.  She denies diarrhea or constipation.  No dysuria, states she has not urinated since 6 AM this morning.  Surgical history significant for abdominal hysterectomy and cholecystectomy.    The history is provided by the patient.       Home Medications Prior to Admission medications   Medication Sig Start Date End Date Taking? Authorizing Provider  acetaminophen (TYLENOL) 500 MG tablet Take 1,000 mg by mouth every 6 (six) hours as needed for pain.   Yes [provider]  albuterol (PROVENTIL HFA;VENTOLIN HFA) 108 (90 BASE) MCG/ACT inhaler Inhale 2 puffs into the lungs every 6 (six) hours as needed for shortness of breath.    Yes [provider]  ALPRAZolam Prudy Feeler) 0.5 MG tablet Take 0.5 mg by mouth 3 (three) times daily. 04/13/21  Yes [provider]  aspirin EC 81 MG EC tablet Take 1 tablet (81 mg total) by mouth daily with breakfast. Swallow whole. 01/03/22  Yes Vassie Loll, MD  metoCLOPramide (REGLAN) 10 MG tablet Take 1 tablet  (10 mg total) by mouth every 6 (six) hours. 04/11/22  Yes Ebunoluwa Gernert, Raynelle Fanning, PA-C  nebivolol (BYSTOLIC) 10 MG tablet Take 1 tablet (10 mg total) by mouth daily. 01/08/22 01/03/23 Yes Nida, Denman George, MD  spironolactone (ALDACTONE) 25 MG tablet Take 1 tablet (25 mg total) by mouth daily. 02/05/22 01/31/23 Yes Jodelle Red, MD  VRAYLAR 3 MG capsule Take 3 mg by mouth daily. 01/23/22  Yes [provider]  atorvastatin (LIPITOR) 20 MG tablet Take 20 mg by mouth at bedtime. Patient not taking: Reported on 04/11/2022 12/05/21   [provider]  EDARBI 80 MG TABS Take 1 tablet by mouth at bedtime. 03/14/21   [provider]      Allergies    Bee venom, Codeine, Penicillins, Prilosec [omeprazole], and Zantac [ranitidine]    Review of Systems   Review of Systems  Constitutional:  Positive for appetite change. Negative for fever.  HENT:  Negative for congestion and sore throat.   Eyes: Negative.   Respiratory:  Negative for chest tightness and shortness of breath.   Cardiovascular:  Negative for chest pain.  Gastrointestinal:  Positive for nausea and vomiting. Negative for abdominal pain.  Genitourinary:  Positive for decreased urine volume.  Musculoskeletal:  Negative for arthralgias, joint swelling and neck pain.  Skin: Negative.  Negative for rash and wound.  Neurological:  Positive for light-headedness. Negative for dizziness, weakness, numbness and headaches.  Psychiatric/Behavioral: Negative.  Physical Exam Updated Vital Signs BP 126/70   Pulse 93   Temp 97.6 F (36.4 C) (Oral)   Resp 19   Ht 5\' 3"  (1.6 m)   Wt 62.1 kg   SpO2 94%   BMI 24.27 kg/m  Physical Exam Vitals and nursing note reviewed.  Constitutional:      Appearance: She is well-developed.  HENT:     Head: Normocephalic and atraumatic.  Eyes:     Conjunctiva/sclera: Conjunctivae normal.  Cardiovascular:     Rate and Rhythm: Normal rate and regular rhythm.     Heart sounds: Normal  heart sounds.  Pulmonary:     Effort: Pulmonary effort is normal.     Breath sounds: Normal breath sounds. No wheezing.  Abdominal:     General: Bowel sounds are normal. There is no distension.     Palpations: Abdomen is soft.     Tenderness: There is no abdominal tenderness. There is no guarding.  Musculoskeletal:        General: Normal range of motion.     Cervical back: Normal range of motion.  Skin:    General: Skin is warm and dry.  Neurological:     Mental Status: She is alert.     ED Results / Procedures / Treatments   Labs (all labs ordered are listed, but only abnormal results are displayed) Labs Reviewed  URINALYSIS, ROUTINE W REFLEX MICROSCOPIC - Abnormal; Notable for the following components:      Result Value   Ketones, ur 20 (*)    Leukocytes,Ua MODERATE (*)    All other components within normal limits  COMPREHENSIVE METABOLIC PANEL - Abnormal; Notable for the following components:   Total Bilirubin 1.8 (*)    All other components within normal limits  CBC WITH DIFFERENTIAL/PLATELET - Abnormal; Notable for the following components:   RBC 5.18 (*)    Hemoglobin 16.6 (*)    HCT 50.1 (*)    All other components within normal limits  LIPASE, BLOOD    EKG None  Radiology CT ABDOMEN PELVIS W CONTRAST  Result Date: 04/11/2022 CLINICAL DATA:  Nausea/vomiting, weakness x 30 days, dehydrated EXAM: CT ABDOMEN AND PELVIS WITH CONTRAST TECHNIQUE: Multidetector CT imaging of the abdomen and pelvis was performed using the standard protocol following bolus administration of intravenous contrast. RADIATION DOSE REDUCTION: This exam was performed according to the departmental dose-optimization program which includes automated exposure control, adjustment of the mA and/or kV according to patient size and/or use of iterative reconstruction technique. CONTRAST:  OMNIPAQUE IOHEXOL 300 MG/ML  SOLN COMPARISON:  CT abdomen/pelvis dated 12/19/2004 FINDINGS: Lower chest:  Emphysematous changes at the lung bases. Hepatobiliary: 10 mm hypoenhancing lesion in the right hepatic dome (series 2/image 8). This is favored to be present in 2006 (although possibly larger on the prior), compatible with a benign etiology. Status post cholecystectomy. No intrahepatic or extrahepatic ductal dilatation. Pancreas: Within normal limits. Spleen: Within normal limits. Adrenals/Urinary Tract: Adrenal glands are within normal limits. Subcentimeter bilateral renal cysts. Left lower pole renal scarring. No hydronephrosis. Bladder is mildly thick-walled although underdistended. Stomach/Bowel: Stomach is notable for a tiny hiatal hernia. No evidence of bowel obstruction. Normal appendix (series 2/image 45). Mild sigmoid diverticulosis, without evidence of diverticulitis. Vascular/Lymphatic: No evidence of abdominal aortic aneurysm. Atherosclerotic calcifications of the abdominal aorta and branch vessels. No suspicious abdominopelvic lymphadenopathy. Reproductive: Status post hysterectomy. Bilateral ovaries are within normal limits. Other: No abdominopelvic ascites. Musculoskeletal: Visualized osseous structures are within normal limits. IMPRESSION: No  evidence of bowel obstruction. Normal appendix. Mild sigmoid diverticulosis, without evidence of diverticulitis. Status post cholecystectomy and hysterectomy. Electronically Signed   By: Charline Bills M.D.   On: 04/11/2022 19:10    Procedures Procedures    Medications Ordered in ED Medications  sodium chloride 0.9 % bolus 1,000 mL (0 mLs Intravenous Stopped 04/11/22 2023)  metoCLOPramide (REGLAN) injection 10 mg (10 mg Intravenous Given 04/11/22 1706)  sodium chloride 0.9 % bolus 1,000 mL (0 mLs Intravenous Stopped 04/11/22 2023)  iohexol (OMNIPAQUE) 300 MG/ML solution 100 mL (100 mLs Intravenous Contrast Given 04/11/22 1844)    ED Course/ Medical Decision Making/ A&P                           Medical Decision Making Pt with month long history  of n/v, subjectively dehydrated but with relatively stable labs.  She has small amount of ketonuria, but BUN and creatinine are normal today which is reassuring.  Ddx including gastritis/ulcer, pancreatitis, gastroparesis, sbo, biliary tract pathology (pt is s/p cholecystectomy), functional, stress.    Amount and/or Complexity of Data Reviewed Labs: ordered.    Details: ketones 20, no uti.  total bilirubin is elevated, but LFT's normal, lipase normal.  hemoglobin elevated which could be hemoconcentration but more likely secondary to smoking habit as other parameters are normal. Radiology: ordered.    Details: no acute intraabdominal findings.  Risk Prescription drug management. Decision regarding hospitalization. Risk Details: No indication for admission.  She was given IV reglan and IVF, felt improved at dc, prescribed additional reglan in place of zofran, also encouraged pepcid daily (cannot tolerated PPI's). Referral to GI, suspect pt may benefit from EGD/ GI expertise           Final Clinical Impression(s) / ED Diagnoses Final diagnoses:  Nausea and vomiting, unspecified vomiting type    Rx / DC Orders ED Discharge Orders          Ordered    metoCLOPramide (REGLAN) 10 MG tablet  Every 6 hours        04/11/22 1948              Victoriano Lain 04/13/22 [id number]    Cheryll Cockayne, MD 04/16/22 1949

## 2022-04-15 ENCOUNTER — Ambulatory Visit (HOSPITAL_BASED_OUTPATIENT_CLINIC_OR_DEPARTMENT_OTHER): Payer: BC Managed Care – PPO | Admitting: Cardiology

## 2022-04-15 ENCOUNTER — Encounter (HOSPITAL_BASED_OUTPATIENT_CLINIC_OR_DEPARTMENT_OTHER): Payer: Self-pay | Admitting: Cardiology

## 2022-04-15 VITALS — BP 130/85 | HR 87 | Ht 63.0 in | Wt 136.5 lb

## 2022-04-15 DIAGNOSIS — R634 Abnormal weight loss: Secondary | ICD-10-CM

## 2022-04-15 DIAGNOSIS — E269 Hyperaldosteronism, unspecified: Secondary | ICD-10-CM | POA: Diagnosis not present

## 2022-04-15 DIAGNOSIS — R0989 Other specified symptoms and signs involving the circulatory and respiratory systems: Secondary | ICD-10-CM

## 2022-04-15 DIAGNOSIS — Z7189 Other specified counseling: Secondary | ICD-10-CM

## 2022-04-15 DIAGNOSIS — R002 Palpitations: Secondary | ICD-10-CM

## 2022-04-15 NOTE — Patient Instructions (Signed)

## 2022-04-15 NOTE — Progress Notes (Signed)
Cardiology Office Note:    Date:  04/15/2022   ID:  [name], DOB 09/12/1966, MRN [id number]  PCP:  Celene Squibb, MD  Cardiologist:  Buford Dresser, MD PhD  Referring MD: Celene Squibb, MD   CC: follow up  History of Present Illness:    [name] is a 56 y.o. female with a hx of tobacco use, hypertension who is seen for follow up today. I initially met her 05/25/2019 as a new consult at the request of Celene Squibb, MD for the evaluation and management of dizziness, palpitations, left arm/chest tightness.  At her last appointment her blood pressures had started to elevate intermittently with minimal activity. Lowest BP she had seen was ~90/70, but most in 130s and occasionally 150s. Had not required PRN hydralazine. Her BP would improve when she relaxed, dropped >20 points.  We reviewed the lab results as follows: The catecholamine test is reassuring. Numbers were very normal and some low. This suggests against pheochromocytoma, though there is a small chance that we missed some spikes. We could consider a 24 hour urine test to make sure we aren't missing anything.   The aldosterone/renin test was mixed. The renin level was very low, suggesting that it is being suppressed, but the aldosterone was normal. Typically when we are thinking about hyperaldosteronism, the aldosterone will be much higher and the renin will be low. However, the edarbi can affect these numbers. Because of this, it is more likely that you DO have elevated aldosterone with these numbers while on edarbi.   She was trialed on spironolactone.  Today: She is accompanied by a family member. She states she is feeling better. After three weeks since starting her spironolactone, her blood pressure had bottomed out to 88 systolic. They report her blood pressures have recently averaged around 108, 111. She will feel worse at 128 systolic or lower. For a time she was experiencing dizziness, especially after bending  over.  Today she confirms that she is not taking aspirin, atorvastatin, or edarbi. She is taking Reglan for nausea, Bystolic, spironolactone, and vraylar.   Will have occasional palpitations, especially in hot weather.  Also, she reports losing 30 lbs unintentionally. She has been suffering from frequent emesis. No pain or hematochezia. She eats when she feels hungry, but this is usually not a large amount.   She denies any chest pain, or peripheral edema. No headaches, syncope, orthopnea, or PND.    Past Medical History:  Diagnosis Date   Asthma    COPD (chronic obstructive pulmonary disease) ([place])    Depression    Seasonal allergies     Past Surgical History:  Procedure Laterality Date   ABDOMINAL HYSTERECTOMY     CHOLECYSTECTOMY     COLONOSCOPY  05/21/2011   Procedure: COLONOSCOPY;  Surgeon: Rogene Houston, MD;  Location: [facility];  Service: Endoscopy;  Laterality: N/A;  12:30   ESOPHAGOGASTRODUODENOSCOPY     TUBAL LIGATION      Current Medications: Current Outpatient Medications on File Prior to Visit  Medication Sig   acetaminophen (TYLENOL) 500 MG tablet Take 1,000 mg by mouth every 6 (six) hours as needed for pain.   albuterol (PROVENTIL HFA;VENTOLIN HFA) 108 (90 BASE) MCG/ACT inhaler Inhale 2 puffs into the lungs every 6 (six) hours as needed for shortness of breath.    ALPRAZolam (XANAX) 0.5 MG tablet Take 0.5 mg by mouth 3 (three) times daily.   aspirin EC 81 MG EC tablet Take  1 tablet (81 mg total) by mouth daily with breakfast. Swallow whole.   atorvastatin (LIPITOR) 20 MG tablet Take 20 mg by mouth at bedtime. (Patient not taking: Reported on 04/11/2022)   EDARBI 80 MG TABS Take 1 tablet by mouth at bedtime.   metoCLOPramide (REGLAN) 10 MG tablet Take 1 tablet (10 mg total) by mouth every 6 (six) hours.   nebivolol (BYSTOLIC) 10 MG tablet Take 1 tablet (10 mg total) by mouth daily.   spironolactone (ALDACTONE) 25 MG tablet Take 1 tablet (25 mg total) by mouth  daily.   VRAYLAR 3 MG capsule Take 3 mg by mouth daily.   No current facility-administered medications on file prior to visit.     Allergies:   Bee venom, Codeine, Penicillins, Prilosec [omeprazole], and Zantac [ranitidine]   Social History   Tobacco Use   Smoking status: Every Day    Packs/day: 0.50    Years: 23.00    Total pack years: 11.50    Types: Cigarettes   Smokeless tobacco: Never  Vaping Use   Vaping Use: Every day  Substance Use Topics   Alcohol use: No   Drug use: No      Family History: The patient's family history includes Colon cancer in an other family member; Hyperlipidemia in her mother; Hypertension in her mother; Kidney cancer in her father; Stroke in her mother. mother had a stroke at age 41, 71 had CHF, [name] had heart and blood pressure problems. Everyone else has cancer.  ROS:   Please see the history of present illness.   (+) Emesis (+) Nausea (+) Unintentional weight loss (+) Palpitations (+) Wheezing Additional pertinent ROS otherwise unremarkable.   EKGs/Labs/Other Studies Reviewed:    The following studies were reviewed today:  Echo 01/02/22  1. Left ventricular ejection fraction, by estimation, is 50 to 55%. The  left ventricle has low normal function. The left ventricle has no regional  wall motion abnormalities. Left ventricular diastolic parameters were  normal.   2. Right ventricular systolic function is normal. The right ventricular  size is normal. There is normal pulmonary artery systolic pressure.   3. The mitral valve is normal in structure. No evidence of mitral valve  regurgitation. No evidence of mitral stenosis.   4. The aortic valve is tricuspid. Aortic valve regurgitation is not  visualized. No aortic stenosis is present.   5. The inferior vena cava is normal in size with greater than 50%  respiratory variability, suggesting right atrial pressure of 3 mmHg.   Comparison(s): No prior Echocardiogram.   CTA  Head/Neck 01/01/22 IMPRESSION: Patent vasculature of the head and neck with no significant stenosis or occlusion.  CT Head 01/01/22 IMPRESSION: 1. No acute intracranial pathology. 2. ASPECTS is 10 3. Empty sella. This finding is nonspecific and could reflect a normal variant common but can also be seen in the setting of intracranial hypertension.  Blood Pressure Monitor 07/09/21 24 ambulatory summary: Mean BP 132/77, HR 81 Awake average 140/84, HR 82 Asleep average 111/62, HR 78   Dip with sleep: systolic 54%, diastolic 09%   Max systolic: 811 Max diastolic: 914   Normal BP variability, average BP near goal.  CT coronary 06/16/2019 FINDINGS: Coronary calcium score: The patient's coronary artery calcium score is 0, which places the patient in the 0 percentile.   Coronary arteries: Normal coronary origins.  Right dominance.   Right Coronary Artery: Medium caliber vessel that give rise to PLB and PDA. No significant plaque or  stenosis.   Left Main Coronary Artery: No significant plaque or stenosis.   Left Anterior Descending Coronary Artery: Gives rise to two medium sized diagonals. No significant plaque or stenosis in LAD or branch vessels.   Left Circumflex Artery: Gives rise to two large OM branches. No significant plaque or stenosis in LCx or branch vessels.   Aorta: Normal size, 27 mm at the mid ascending aorta (level of the PA bifurcation) measured double oblique. No calcifications. No dissection.   Aortic Valve: No calcifications. Trileaflet.   Other findings:  Normal pulmonary vein drainage into the left atrium.  Normal left atrial appendage without a thrombus.  Normal size of the pulmonary artery.   IMPRESSION: 1. No evidence of CAD, CADRADS = 0.  2. Coronary calcium score of 0. This was 0 percentile for age and sex matched control.  3. Normal coronary origin with right dominance.  Event monitor 08/30/2019 12 days of data recorded on Zio monitor.  Patient had a min HR of 58 bpm, max HR of 184 bpm, and avg HR of 82 bpm. Predominant underlying rhythm was Sinus Rhythm. No VT, atrial fibrillation, high degree block, or pauses noted. Isolated atrial and ventricular ectopy was rare (<1%). There were 19 triggered events. There were 6 SVT events noted, all very brief (longest 5 beats). These were detected near patient triggered event markers.  Carotid u/s 06/07/2019 RIGHT CAROTID ARTERY: No significant calcifications of the right common carotid artery. Intermediate waveform maintained. Heterogeneous and partially calcified plaque at the right carotid bifurcation. No significant lumen shadowing. Low resistance waveform of the right ICA. No significant tortuosity.   RIGHT VERTEBRAL ARTERY: Antegrade flow with low resistance waveform.   LEFT CAROTID ARTERY: No significant calcifications of the left common carotid artery. Intermediate waveform maintained. Heterogeneous and partially calcified plaque at the left carotid bifurcation without significant lumen shadowing. Low resistance waveform of the left ICA. No significant tortuosity.   LEFT VERTEBRAL ARTERY:  Antegrade flow with low resistance waveform.   IMPRESSION: Color duplex indicates minimal heterogeneous and calcified plaque, with no hemodynamically significant stenosis by duplex criteria in the extracranial cerebrovascular circulation.  EKG:  EKG was personally reviewed today 04/15/2022: EKG was not ordered. 01/10/2022: not ordered 03/07/21 NSR 81 bpm  Recent Labs: 01/01/2022: TSH 0.911 04/11/2022: ALT 17; BUN 9; Creatinine, Ser 0.79; Hemoglobin 16.6; Platelets ACLMP; Potassium 3.9; Sodium 139   Recent Lipid Panel    Component Value Date/Time   CHOL 144 01/02/2022 0447   TRIG 86 01/02/2022 0447   HDL 48 01/02/2022 0447   CHOLHDL 3.0 01/02/2022 0447   VLDL 17 01/02/2022 0447   LDLCALC 79 01/02/2022 0447    Physical Exam:    VS:  BP 130/85 (BP Location: Left Arm, Patient  Position: Sitting, Cuff Size: Normal)   Pulse 87   Ht _0  (1.6 m)   Wt 136 lb 8 oz (61.9 kg)   SpO2 96%   BMI 24.18 kg/m     Wt Readings from Last 3 Encounters:  04/15/22 136 lb 8 oz (61.9 kg)  04/11/22 137 lb (62.1 kg)  02/19/22 150 lb 6.4 oz (68.2 kg)    GEN: Well nourished, well developed in no acute distress HEENT: Normal, moist mucous membranes NECK: No JVD CARDIAC: regular rhythm, normal S1 and S2, no rubs or gallops. No murmur. VASCULAR: Radial and DP pulses 2+ bilaterally. No carotid bruits RESPIRATORY:  Largely clear, faint wheezing bilaterally ABDOMEN: Soft, non-tender, non-distended MUSCULOSKELETAL:  Ambulates independently SKIN: Warm and dry,  no edema NEUROLOGIC:  Alert and oriented x 3. No focal neuro deficits noted. PSYCHIATRIC:  Normal affect    ASSESSMENT:    1. Labile hypertension   2. Hyperaldosteronism (HCC)   3. Palpitation   4. Counseling on health promotion and disease prevention   5. Unintentional weight loss     PLAN:    Unintentional weight loss vomiting -unclear etiology of this, but recommended she discuss further evaluation with her PCP -poor oral nutrition may also be affecting her intermittent hypotension  Transient neuro symptoms with elevated BP Labile hypertension Concern for orthostatic hypotension, with orthostatic lightheadedness -BP low with initiation of spironolactone (may be exacerbated by poor PO intake, as above) -BP now 130/85, on spironolactone and nebivolol alone -she is not currently taking azilsartan, ok to remain off of this  Palpitations:  -managed on bystolic current dose -see monitor as above  Tobacco use counseling: working to cut back  CV prevention: -calcium score 0, no CAD on CT 2020 -she has stopped aspirin, atorvastatin. Ok to remain off of these, can reassess in the future  Cardiac risk counseling and prevention recommendations: -recommend heart healthy/Mediterranean diet, with whole grains, fruits,  vegetable, fish, lean meats, nuts, and olive oil. Limit salt. -recommend moderate walking, 3-5 times/week for 30-50 minutes each session. Aim for at least 150 minutes.week. Goal should be pace of 3 miles/hours, or walking 1.5 miles in 30 minutes -recommend avoidance of tobacco products. Avoid excess alcohol.  Plan for follow up: 6 months or sooner as needed.  Buford Dresser, MD, PhD, [facility]    Medication Adjustments/Labs and Tests Ordered: Current medicines are reviewed at length with the patient today.  Concerns regarding medicines are outlined above.   No orders of the defined types were placed in this encounter.  No orders of the defined types were placed in this encounter.  Patient Instructions  Medication Instructions:  Your Physician recommend you continue on your current medication as directed.    *If you need a refill on your cardiac medications before your next appointment, please call your pharmacy*   Lab Work: None ordered today   Testing/Procedures: None ordered today   Follow-Up: At [facility], you and your health needs are our priority.  As part of our continuing mission to provide you with exceptional heart care, we have created designated Provider Care Teams.  These Care Teams include your primary Cardiologist (physician) and Advanced Practice Providers (APPs -  Physician Assistants and Nurse Practitioners) who all work together to provide you with the care you need, when you need it.  We recommend signing up for the patient portal called "MyChart".  Sign up information is provided on this After Visit Summary.  MyChart is used to connect with patients for Virtual Visits (Telemedicine).  Patients are able to view lab/test results, encounter notes, upcoming appointments, etc.  Non-urgent messages can be sent to your provider as well.   To learn more about what you can do with MyChart, go to [email or web address].    Your next  appointment:   6 month(s)  The format for your next appointment:   In Person  Provider:   Buford Dresser, MD{           I,Mathew Stumpf,acting as a scribe for Buford Dresser, MD.,have documented all relevant documentation on the behalf of Buford Dresser, MD,as directed by  Buford Dresser, MD while in the presence of Buford Dresser, MD.  I, Buford Dresser, MD, have reviewed all documentation  for this visit. The documentation on 05/28/22 for the exam, diagnosis, procedures, and orders are all accurate and complete.   Signed, Buford Dresser, MD PhD 04/15/2022     [place]

## 2022-05-01 NOTE — Progress Notes (Unsigned)
Referring Provider: Norman Clay, MD Primary Care Physician:  Benita Stabile, MD Primary Gastroenterologist:  Dr. Marletta Lor  No chief complaint on file.   HPI:   [name] is a 56 y.o. female presenting today at the request of Norman Clay, MD, [facility] physician, for nausea and vomiting.  Patient was seen in the emergency room 04/11/2022 reporting 4 weeks of nausea, vomiting, increasing weakness with weight loss.  PCP had given her Zofran today before, but she felt this worsened her symptoms.  No alleviating factors.  No associated constipation or diarrhea.  Felt dehydrated.  CMP unremarkable aside from bilirubin 1.8, CBC with hemoglobin of 16.6, otherwise within normal limits.  Lipase normal.  UA with moderate leukocytes, no nitrites or bacteria.  CT A/P with contrast with no acute findings, 10 mm hypoenhancing lesion in the right hepatic dome favored to be present in 2006, compatible with benign etiology, s/p cholecystectomy without biliary abnormalities, tiny hiatal hernia.  She was given Reglan and IV fluids in the ER and felt improved.  She was prescribed Reglan in place of Zofran, encouraged to take Pepcid daily, and recommended seeing GI for evaluation.  Today:   Past Medical History:  Diagnosis Date   Asthma    COPD (chronic obstructive pulmonary disease) (HCC)    Depression    Seasonal allergies     Past Surgical History:  Procedure Laterality Date   ABDOMINAL HYSTERECTOMY     CHOLECYSTECTOMY     COLONOSCOPY  05/21/2011   Procedure: COLONOSCOPY;  Surgeon: Malissa Hippo, MD;  [facility]: [facility];  Service: Endoscopy;  Laterality: N/A;  12:30   ESOPHAGOGASTRODUODENOSCOPY     TUBAL LIGATION      Current Outpatient Medications  Medication Sig Dispense Refill   acetaminophen (TYLENOL) 500 MG tablet Take 1,000 mg by mouth every 6 (six) hours as needed for pain.     albuterol (PROVENTIL HFA;VENTOLIN HFA) 108 (90 BASE) MCG/ACT inhaler Inhale 2 puffs into the lungs  every 6 (six) hours as needed for shortness of breath.      ALPRAZolam (XANAX) 0.5 MG tablet Take 0.5 mg by mouth 3 (three) times daily.     metoCLOPramide (REGLAN) 10 MG tablet Take 1 tablet (10 mg total) by mouth every 6 (six) hours. 30 tablet 0   nebivolol (BYSTOLIC) 10 MG tablet Take 1 tablet (10 mg total) by mouth daily. 90 tablet 3   spironolactone (ALDACTONE) 25 MG tablet Take 1 tablet (25 mg total) by mouth daily. 90 tablet 3   VRAYLAR 3 MG capsule Take 3 mg by mouth daily.     No current facility-administered medications for this visit.    Allergies as of 05/02/2022 - Review Complete 04/11/2022  Allergen Reaction Noted   Bee venom Anaphylaxis 03/07/2021   Codeine Nausea And Vomiting and Rash 05/21/2011   Penicillins Nausea And Vomiting and Rash 05/21/2011   Prilosec [omeprazole] Swelling and Rash 03/15/2013   Zantac [ranitidine] Rash 03/07/2021    Family History  Problem Relation Age of Onset   Hypertension Mother    Hyperlipidemia Mother    Stroke Mother    Kidney cancer Father    Colon cancer Other     Social History   Socioeconomic History   Marital status: Married    Spouse name: Not on file   Number of children: 2   Years of education: 12   Highest education level: High school graduate  Occupational History   Occupation: [employer]  Tobacco Use   Smoking status: Every Day    Packs/day: 0.50    Years: 23.00    Total pack years: 11.50    Types: Cigarettes   Smokeless tobacco: Never  Vaping Use   Vaping Use: Every day  Substance and Sexual Activity   Alcohol use: No   Drug use: No   Sexual activity: Yes    Birth control/protection: Surgical  Other Topics Concern   Not on file  Social History Narrative   Lives at home with husband.   Right-handed.   2 cups of caffeine per day.   Social Determinants of Health   Financial Resource Strain: Not on file  Food Insecurity: Not on file  Transportation Needs: Not on file  Physical Activity: Not on  file  Stress: Not on file  Social Connections: Not on file  Intimate Partner Violence: Not on file    Review of Systems: Gen: Denies any fever, chills, cold or flulike symptoms, presyncope, syncope. CV: Denies chest pain, heart palpitations.  Resp: Denies shortness of breath, cough.  GI: See HPI GU : Denies urinary burning, urinary frequency, urinary hesitancy MS: Denies joint pain. Derm: Denies rash. Psych: Denies depression, anxiety. Heme: See HPI  Physical Exam: There were no vitals taken for this visit. General:   Alert and oriented. Pleasant and cooperative. Well-nourished and well-developed.  Head:  Normocephalic and atraumatic. Eyes:  Without icterus, sclera clear and conjunctiva pink.  Ears:  Normal auditory acuity. Lungs:  Clear to auscultation bilaterally. No wheezes, rales, or rhonchi. No distress.  Heart:  S1, S2 present without murmurs appreciated.  Abdomen:  +BS, soft, non-tender and non-distended. No HSM noted. No guarding or rebound. No masses appreciated.  Rectal:  Deferred  Msk:  Symmetrical without gross deformities. Normal posture. Extremities:  Without edema. Neurologic:  Alert and  oriented x4;  grossly normal neurologically. Skin:  Intact without significant lesions or rashes. Psych:  Normal mood and affect.    Assessment:     Plan:  ***   Ermalinda Memos, PA-C [facility] 05/02/2022

## 2022-05-01 NOTE — H&P (View-Only) (Signed)
Referring Provider: Thamas Jaegers, MD Primary Care Physician:  Celene Squibb, MD Primary Gastroenterologist:  Dr. Abbey Chatters  Chief Complaint  Patient presents with   Emesis    Nausea and vomiting, stomach burning.     HPI:   [name] is a 56 y.o. female presenting today at the request of Thamas Jaegers, MD, [facility] physician, for nausea and vomiting.  Patient was seen in the emergency room 04/11/2022 reporting 4 weeks of nausea, vomiting, increasing weakness with weight loss.  PCP had given her Zofran today before, but she felt this worsened her symptoms.  No alleviating factors.  No associated constipation or diarrhea.  Felt dehydrated.  CMP unremarkable aside from bilirubin 1.8, CBC with hemoglobin of 16.6, otherwise within normal limits.  Lipase normal.  UA with moderate leukocytes, no nitrites or bacteria.  CT A/P with contrast with no acute findings, 10 mm hypoenhancing lesion in the right hepatic dome favored to be present in 2006, compatible with benign etiology, s/p cholecystectomy without biliary abnormalities, tiny hiatal hernia.  She was given Reglan and IV fluids in the ER and felt improved.  She was prescribed Reglan in place of Zofran, encouraged to take Pepcid daily, and recommended seeing GI for evaluation.  Today: Nausea, vomiting for over 1 month, and epigastric burning x 5 days. Like she is on fire.  Reports she lost over 30 lbs since symptoms started. No hematemesis or coffee ground emesis. Taking Reglan BID without significant improvement though she is now able to keep down liquids.  States with Zofran, she cannot even keep down liquids.  Doesn't matter what she is eating, she ends up vomiting.   Taking 1 Pepcid daily. Can't take Prilosec, Nexium, Zantac, as these medications cause rash, whelps, and affects breathing.   No heartburn. No dysphagia. No brbpr or melena. No constipation or diarrhea.   Has a hiatal hernia. Had EGD about 20 years ago with Dr. Laural Golden. No  history of PUD.   No aspirin products. Only tylenol.    Past Medical History:  Diagnosis Date   Asthma    COPD (chronic obstructive pulmonary disease) ([place])    Depression    Seasonal allergies     Past Surgical History:  Procedure Laterality Date   ABDOMINAL HYSTERECTOMY     CHOLECYSTECTOMY     COLONOSCOPY  05/21/2011   Procedure: COLONOSCOPY;  Surgeon: Rogene Houston, MD;  Location: [facility];  Service: Endoscopy;  Laterality: N/A;  12:30   ESOPHAGOGASTRODUODENOSCOPY     TUBAL LIGATION      Current Outpatient Medications  Medication Sig Dispense Refill   acetaminophen (TYLENOL) 500 MG tablet Take 1,000 mg by mouth every 6 (six) hours as needed for pain.     albuterol (PROVENTIL HFA;VENTOLIN HFA) 108 (90 BASE) MCG/ACT inhaler Inhale 2 puffs into the lungs every 6 (six) hours as needed for shortness of breath.      ALPRAZolam (XANAX) 0.5 MG tablet Take 0.5 mg by mouth 3 (three) times daily.     famotidine (PEPCID) 40 MG tablet Take 1 tablet (40 mg total) by mouth 2 (two) times daily. 60 tablet 1   metoCLOPramide (REGLAN) 10 MG tablet Take 1 tablet (10 mg total) by mouth every 6 (six) hours. 30 tablet 0   nebivolol (BYSTOLIC) 10 MG tablet Take 1 tablet (10 mg total) by mouth daily. 90 tablet 3   promethazine (PHENERGAN) 12.5 MG tablet Take 1-2 tablets by mouth every 6 hours as needed for  nausea or vomiting. 30 tablet 0   spironolactone (ALDACTONE) 25 MG tablet Take 1 tablet (25 mg total) by mouth daily. 90 tablet 3   VRAYLAR 3 MG capsule Take 3 mg by mouth daily.     No current facility-administered medications for this visit.    Allergies as of 05/02/2022 - Review Complete 05/02/2022  Allergen Reaction Noted   Bee venom Anaphylaxis 03/07/2021   Codeine Nausea And Vomiting and Rash 05/21/2011   Penicillins Nausea And Vomiting and Rash 05/21/2011   Prilosec [omeprazole] Swelling and Rash 03/15/2013   Zantac [ranitidine] Rash 03/07/2021    Family History  Problem  Relation Age of Onset   Hypertension Mother    Hyperlipidemia Mother    Stroke Mother    Kidney cancer Father    Colon cancer Other     Social History   Socioeconomic History   Marital status: Married    Spouse name: Not on file   Number of children: 2   Years of education: 12   Highest education level: High school graduate  Occupational History   Occupation: [employer]  Tobacco Use   Smoking status: Every Day    Packs/day: 0.50    Years: 23.00    Total pack years: 11.50    Types: Cigarettes   Smokeless tobacco: Never  Vaping Use   Vaping Use: Every day  Substance and Sexual Activity   Alcohol use: No   Drug use: No   Sexual activity: Yes    Birth control/protection: Surgical  Other Topics Concern   Not on file  Social History Narrative   Lives at home with husband.   Right-handed.   2 cups of caffeine per day.   Social Determinants of Health   Financial Resource Strain: Not on file  Food Insecurity: Not on file  Transportation Needs: Not on file  Physical Activity: Not on file  Stress: Not on file  Social Connections: Not on file  Intimate Partner Violence: Not on file    Review of Systems: Gen: Denies any fever, chills, cold or flulike symptoms, presyncope, syncope. CV: Denies chest pain, heart palpitations.  Resp: Denies shortness of breath, cough.  GI: See HPI GU : Denies urinary burning, urinary frequency, urinary hesitancy MS: Denies joint pain. Derm: Denies rash. Psych: Denies depression, anxiety. Heme: See HPI  Physical Exam: BP 134/85 (BP Location: Left Arm, Patient Position: Sitting, Cuff Size: Normal)   Pulse 80   Temp 97.8 F (36.6 C) (Temporal)   Ht '5\' 2"'$  (1.575 m)   Wt 137 lb 6.4 oz (62.3 kg)   BMI 25.13 kg/m  General:   Alert and oriented. Pleasant and cooperative. Well-nourished and well-developed.  Head:  Normocephalic and atraumatic. Eyes:  Without icterus, sclera clear and conjunctiva pink.  Ears:  Normal auditory  acuity. Lungs:  Clear to auscultation bilaterally.  Few scattered wheezes.  No rales, rhonchi.  No distress.   Heart:  S1, S2 present without murmurs appreciated.  Abdomen:  +BS, soft,  and non-distended.  Moderate TTP in epigastric area.  No HSM noted. No guarding or rebound. No masses appreciated.  Rectal:  Deferred  Msk:  Symmetrical without gross deformities. Normal posture. Extremities:  Without edema. Neurologic:  Alert and  oriented x4;  grossly normal neurologically. Skin:  Intact without significant lesions or rashes. Psych:  Normal mood and affect.    Assessment:  56 year old female with history of COPD, anxiety/depression, presenting today for further evaluation of nausea, vomiting, abdominal pain.  Nausea/vomiting/abdominal  pain: Patient reports 1 month of daily nausea and vomiting, new onset constant epigastric burning for the last 5 days.  She reports 30 pound weight loss since symptom started (documented 21 pound weight loss since May).  Denies NSAIDs.  Evaluated in the emergency room 7/20 for the same symptoms.  Laboratory evaluation with no significant abnormalities.  CT A/P with contrast with no acute findings, 10 mm hypoechoic enhancing lesion in the right hepatic dome.  She failed Zofran as she was unable to keep food or liquids down.  ER prescribed Reglan which is allowing her to at least keep liquids down and stay hydrated though she still vomiting after eating essentially anything.  She also started Pepcid 20 mg daily after ER evaluation.  States she is unable to take PPIs as Prilosec and Nexium have caused rash, whelps, and affected her breathing.  Differentials include gastritis, duodenitis, PUD, H. pylori, pyloric stenosis, other gastric outlet obstruction such as malignancy.  She needs EGD ASAP for further evaluation.  Will also increase Pepcid to 40 mg twice daily and prescribed Phenergan for nausea/vomiting.  Advised not to take Reglan with Phenergan.  Liver  lesion: 10 mm hypoenhancing lesion in the right hepatic dome on recent CT A/P with contrast.  Radiologist stated this was favored to be present in 2006 as well, but no mention of this in the CT report.  Discussed with patient who would like to proceed with an MRI to confirm that this lesion is in fact benign.   Plan:  Proceed with upper endoscopy with propofol by Dr. Abbey Chatters ASAP. The risks, benefits, and alternatives have been discussed with the patient in detail. The patient states understanding and desires to proceed. ASA 3 Increase Pepcid to 40 mg twice daily. Phenergan 12.5 mg, take 1-2 tablets every 6 hours as needed for nausea/vomiting.  Advised not to take while driving as this medication can cause drowsiness. Do not take Reglan with Phenergan. Add boost, Ensure, or other protein shakes twice daily to help maintain adequate nutrition. MRI liver with and without contrast. Follow-up after EGD.   Aliene Altes, PA-C [facility] 05/02/2022

## 2022-05-02 ENCOUNTER — Telehealth: Payer: Self-pay | Admitting: *Deleted

## 2022-05-02 ENCOUNTER — Encounter: Payer: Self-pay | Admitting: *Deleted

## 2022-05-02 ENCOUNTER — Ambulatory Visit: Payer: BC Managed Care – PPO | Admitting: Gastroenterology

## 2022-05-02 ENCOUNTER — Encounter: Payer: Self-pay | Admitting: Gastroenterology

## 2022-05-02 VITALS — BP 134/85 | HR 80 | Temp 97.8°F | Ht 62.0 in | Wt 137.4 lb

## 2022-05-02 DIAGNOSIS — R1013 Epigastric pain: Secondary | ICD-10-CM | POA: Insufficient documentation

## 2022-05-02 DIAGNOSIS — K769 Liver disease, unspecified: Secondary | ICD-10-CM | POA: Diagnosis not present

## 2022-05-02 DIAGNOSIS — R634 Abnormal weight loss: Secondary | ICD-10-CM | POA: Diagnosis not present

## 2022-05-02 DIAGNOSIS — R112 Nausea with vomiting, unspecified: Secondary | ICD-10-CM | POA: Insufficient documentation

## 2022-05-02 MED ORDER — FAMOTIDINE 40 MG PO TABS: 40.0000 mg | ORAL_TABLET | Freq: Two times a day (BID) | ORAL | 1 refills | Status: DC

## 2022-05-02 MED ORDER — PROMETHAZINE HCL 12.5 MG PO TABS: ORAL_TABLET | ORAL | 0 refills | Status: DC

## 2022-05-02 NOTE — Patient Instructions (Addendum)
We will arrange for you to have an upper endoscopy in the very near future with Dr. Abbey Chatters.  Increase Pepcid to 40 mg twice daily 30 minutes before breakfast and dinner.  I have sent a new prescription to your pharmacy.  Try Phenergan 1-2 tablets as needed every 6 hours for nausea/vomiting.  This medication can cause drowsiness and you should not drive while taking this medication.  Do not take Reglan and Phenergan together.  To help maintain adequate nutrition, add nutritional shakes such as boost, Ensure, or other protein shakes twice daily.  We will also arrange to have an MRI of your liver to follow-up on the liver lesion that was seen on your recent CT scan.  We will follow-up with you after your procedures.  Do not hesitate to call if you have any questions or concerns prior to next visit.  If you are unable to maintain adequate hydration, proceed to the emergency room.  Aliene Altes, PA-C [facility]

## 2022-05-02 NOTE — Telephone Encounter (Signed)
Patient has been scheduled for EGD on 05/06/22 at 8:00 am, pre-op is scheduled for 05/03/22 at 11:00 am and MRI of liver scheduled for 05/17/22 at 7:00 am. Pt informed and she will come by office to pick up instructions and appt information

## 2022-05-03 ENCOUNTER — Encounter (HOSPITAL_COMMUNITY)
Admission: RE | Admit: 2022-05-03 | Discharge: 2022-05-03 | Disposition: A | Discharge status: Home / Self Care | Payer: BC Managed Care – PPO | Source: Ambulatory Visit | Attending: Internal Medicine | Admitting: Internal Medicine

## 2022-05-03 ENCOUNTER — Encounter (HOSPITAL_COMMUNITY): Payer: Self-pay

## 2022-05-03 VITALS — BP 123/73 | HR 75 | Temp 97.8°F | Resp 18 | Ht 62.0 in | Wt 137.4 lb

## 2022-05-03 DIAGNOSIS — I10 Essential (primary) hypertension: Secondary | ICD-10-CM | POA: Insufficient documentation

## 2022-05-03 DIAGNOSIS — R634 Abnormal weight loss: Secondary | ICD-10-CM | POA: Diagnosis not present

## 2022-05-03 DIAGNOSIS — Z79899 Other long term (current) drug therapy: Secondary | ICD-10-CM | POA: Diagnosis not present

## 2022-05-03 DIAGNOSIS — Z01812 Encounter for preprocedural laboratory examination: Secondary | ICD-10-CM | POA: Insufficient documentation

## 2022-05-03 DIAGNOSIS — R112 Nausea with vomiting, unspecified: Secondary | ICD-10-CM | POA: Diagnosis not present

## 2022-05-03 DIAGNOSIS — F32A Depression, unspecified: Secondary | ICD-10-CM | POA: Diagnosis not present

## 2022-05-03 DIAGNOSIS — T502X5A Adverse effect of carbonic-anhydrase inhibitors, benzothiadiazides and other diuretics, initial encounter: Secondary | ICD-10-CM | POA: Insufficient documentation

## 2022-05-03 DIAGNOSIS — R1013 Epigastric pain: Secondary | ICD-10-CM | POA: Diagnosis not present

## 2022-05-03 DIAGNOSIS — K222 Esophageal obstruction: Secondary | ICD-10-CM | POA: Diagnosis not present

## 2022-05-03 DIAGNOSIS — K2970 Gastritis, unspecified, without bleeding: Secondary | ICD-10-CM | POA: Diagnosis not present

## 2022-05-03 DIAGNOSIS — K449 Diaphragmatic hernia without obstruction or gangrene: Secondary | ICD-10-CM | POA: Diagnosis not present

## 2022-05-03 DIAGNOSIS — J449 Chronic obstructive pulmonary disease, unspecified: Secondary | ICD-10-CM | POA: Diagnosis not present

## 2022-05-03 DIAGNOSIS — K2289 Other specified disease of esophagus: Secondary | ICD-10-CM | POA: Diagnosis not present

## 2022-05-03 DIAGNOSIS — K219 Gastro-esophageal reflux disease without esophagitis: Secondary | ICD-10-CM | POA: Diagnosis not present

## 2022-05-03 DIAGNOSIS — F17200 Nicotine dependence, unspecified, uncomplicated: Secondary | ICD-10-CM | POA: Diagnosis not present

## 2022-05-03 HISTORY — DX: Essential (primary) hypertension: I10

## 2022-05-03 HISTORY — DX: Other specified postprocedural states: Z98.890

## 2022-05-03 HISTORY — DX: Nausea with vomiting, unspecified: R11.2

## 2022-05-03 LAB — BASIC METABOLIC PANEL
Anion gap: 7 (ref 5–15)
BUN: 10 mg/dL (ref 6–20)
CO2: 28 mmol/L (ref 22–32)
Calcium: 9.2 mg/dL (ref 8.9–10.3)
Chloride: 104 mmol/L (ref 98–111)
Creatinine, Ser: 0.72 mg/dL (ref 0.44–1.00)
GFR, Estimated: >60 mL/min (ref >60)
Glucose, Bld: 120 mg/dL — ABNORMAL HIGH (ref 70–99)
Potassium: 3.5 mmol/L (ref 3.5–5.1)
Sodium: 139 mmol/L (ref 135–145)

## 2022-05-03 NOTE — Patient Instructions (Addendum)
[name]  05/03/2022     '@PREFPERIOPPHARMACY'$ @   Your procedure is scheduled on 05/06/2022.  Report to [facility] at 6:00 A.M.  Call this number if you have problems the morning of surgery:  [phone]   Remember:   Please follow the diet instructions given to you by Dr Ave Filter office.    Take these medicines the morning of surgery with A SIP OF WATER : Xanax Pepcid and Nebivolol    Do not wear jewelry, make-up or nail polish.  Do not wear lotions, powders, or perfumes, or deodorant.  Do not shave 48 hours prior to surgery.  Men may shave face and neck.  Do not bring valuables to the hospital.  [facility] is not responsible for any belongings or valuables.  Contacts, dentures or bridgework may not be worn into surgery.  Leave your suitcase in the car.  After surgery it may be brought to your room.  For patients admitted to the hospital, discharge time will be determined by your treatment team.  Patients discharged the day of surgery will not be allowed to drive home.   Name and phone number of your driver:   Family Special instructions:  N/A  Please read over the following fact sheets that you were given. Care and Recovery After Surgery  Monitored Anesthesia Care Anesthesia refers to techniques, procedures, and medicines that help a person stay safe and comfortable during a medical or dental procedure. Monitored anesthesia care, or sedation, is one type of anesthesia. Your anesthesia specialist may recommend sedation if you will be having a procedure that does not require you to be unconscious. You may have this procedure for: Cataract surgery. A dental procedure. A biopsy. A colonoscopy. During the procedure, you may receive a medicine to help you relax (sedative). There are three levels of sedation: Mild sedation. At this level, you may feel awake and relaxed. You will be able to follow directions. Moderate sedation. At this level, you will be sleepy. You may  not remember the procedure. Deep sedation. At this level, you will be asleep. You will not remember the procedure. The more medicine you are given, the deeper your level of sedation will be. Depending on how you respond to the procedure, the anesthesia specialist may change your level of sedation or the type of anesthesia to fit your needs. An anesthesia specialist will monitor you closely during the procedure. Tell a health care provider about: Any allergies you have. All medicines you are taking, including vitamins, herbs, eye drops, creams, and over-the-counter medicines. Any problems you or family members have had with anesthetic medicines. Any blood disorders you have. Any surgeries you have had. Any medical conditions you have, such as sleep apnea. Whether you are pregnant or may be pregnant. Whether you use cigarettes, alcohol, or drugs. Any use of steroids, whether by mouth or as a cream. What are the risks? Generally, this is a safe procedure. However, problems may occur, including: Getting too much medicine (oversedation). Nausea. Allergic reaction to medicines. Trouble breathing. If this happens, a breathing tube may be used to help with breathing. It will be removed when you are awake and breathing on your own. Heart trouble. Lung trouble. Confusion that gets better with time (emergence delirium). What happens before the procedure? Staying hydrated Follow instructions from your health care provider about hydration, which may include: Up to 2 hours before the procedure - you may continue to drink clear liquids, such as water, clear fruit juice,  black coffee, and plain tea. Eating and drinking restrictions Follow instructions from your health care provider about eating and drinking, which may include: 8 hours before the procedure - stop eating heavy meals or foods, such as meat, fried foods, or fatty foods. 6 hours before the procedure - stop eating light meals or foods, such  as toast or cereal. 6 hours before the procedure - stop drinking milk or drinks that contain milk. 2 hours before the procedure - stop drinking clear liquids. Medicines Ask your health care provider about: Changing or stopping your regular medicines. This is especially important if you are taking diabetes medicines or blood thinners. Taking medicines such as aspirin and ibuprofen. These medicines can thin your blood. Do not take these medicines unless your health care provider tells you to take them. Taking over-the-counter medicines, vitamins, herbs, and supplements. Tests and exams You will have a physical exam. You may have blood tests done to show: How well your kidneys and liver are working. How well your blood can clot. General instructions Plan to have a responsible adult take you home from the hospital or clinic. If you will be going home right after the procedure, plan to have a responsible adult care for you for the time you are told. This is important. What happens during the procedure?  Your blood pressure, heart rate, breathing, level of pain, and overall condition will be monitored. An IV will be inserted into one of your veins. You will be given medicines as needed to keep you comfortable during the procedure. This may mean changing the level of sedation. Depending on your age or the procedure, the sedative may be given: As a pill that you will swallow or as a pill that is inserted into the rectum. As an injection into the vein or muscle. As a spray through the nose. The procedure will be performed. Your breathing, heart rate, and blood pressure will be monitored during the procedure. When the procedure is over, the medicine will be stopped. The procedure may vary among health care providers and hospitals. What happens after the procedure? Your blood pressure, heart rate, breathing rate, and blood oxygen level will be monitored until you leave the hospital or clinic. You  may feel sleepy, clumsy, or nauseous. You may feel forgetful about what happened after the procedure. You may vomit. You may continue to get IV fluids. Do not drive or operate machinery until your health care provider says that it is safe. Summary Monitored anesthesia care is used to keep a patient comfortable during short procedures. Tell your health care provider about any allergies or health conditions you have and about all the medicines you are taking. Before the procedure, follow instructions about when to stop eating and drinking and about changing or stopping any medicines. Your blood pressure, heart rate, breathing rate, and blood oxygen level will be monitored until you leave the hospital or clinic. Plan to have a responsible adult take you home from the hospital or clinic. This information is not intended to replace advice given to you by your health care provider. Make sure you discuss any questions you have with your health care provider. Document Revised: 08/14/2021 Document Reviewed: 08/12/2019 Elsevier Patient Education  [facility], Adult Upper endoscopy is a procedure to look inside the upper GI (gastrointestinal) tract. The upper GI tract is made up of: The esophagus. This is the part of the body that moves food from your mouth to your stomach.  The stomach. The duodenum. This is the first part of your small intestine. This procedure is also called esophagogastroduodenoscopy (EGD) or gastroscopy. In this procedure, your health care provider passes a thin, flexible tube (endoscope) through your mouth and down your esophagus into your stomach and into your duodenum. A small camera is attached to the end of the tube. Images from the camera appear on a monitor in the exam room. During this procedure, your health care provider may also remove a small piece of tissue to be sent to a lab and examined under a microscope (biopsy). Your health care provider may  do an upper endoscopy to diagnose cancers of the upper GI tract. You may also have this procedure to find the cause of other conditions, such as: Stomach pain. Heartburn. Pain or problems when swallowing. Nausea and vomiting. Stomach bleeding. Stomach ulcers. Tell a health care provider about: Any allergies you have. All medicines you are taking, including vitamins, herbs, eye drops, creams, and over-the-counter medicines. Any problems you or family members have had with anesthetic medicines. Any bleeding problems you have. Any surgeries you have had. Any medical conditions you have. Whether you are pregnant or may be pregnant. What are the risks? Your healthcare provider will talk with you about risks. These may include: Infection. Bleeding. Allergic reactions to medicines. A tear or hole (perforation) in the esophagus, stomach, or duodenum. What happens before the procedure? When to stop eating and drinking Follow instructions from your health care provider about what you may eat and drink. These may include: 8 hours before your procedure Stop eating most foods. Do not eat meat, fried foods, or fatty foods. Eat only light foods, such as toast or crackers. All liquids are okay except energy drinks and alcohol. 6 hours before your procedure Stop eating. Drink only clear liquids, such as water, clear fruit juice, black coffee, plain tea, and sports drinks. Do not drink energy drinks or alcohol. 2 hours before your procedure Stop drinking all liquids. You may be allowed to take medicines with small sips of water. If you do not follow your health care provider's instructions, your procedure may be delayed or canceled. Medicines Ask your health care provider about: Changing or stopping your regular medicines. This is especially important if you are taking diabetes medicines or blood thinners. Taking medicines such as aspirin and ibuprofen. These medicines can thin your blood. Do  not take these medicines unless your health care provider tells you to take them. Taking over-the-counter medicines, vitamins, herbs, and supplements. General instructions If you will be going home right after the procedure, plan to have a responsible adult: Take you home from the hospital or clinic. You will not be allowed to drive. Care for you for the time you are told. What happens during the procedure?  An IV will be inserted into one of your veins. You may be given one or more of the following: A medicine to help you relax (sedative). A medicine to numb the throat (local anesthetic). You will lie on your left side on an exam table. Your health care provider will pass the endoscope through your mouth and down your esophagus. Your health care provider will use the scope to check the inside of your esophagus, stomach, and duodenum. Biopsies may be taken. The endoscope will be removed. The procedure may vary among health care providers and hospitals. What happens after the procedure? Your blood pressure, heart rate, breathing rate, and blood oxygen level will be monitored until you  leave the hospital or clinic. When your throat is no longer numb, you may be given some fluids to drink. If you were given a sedative during the procedure, it can affect you for several hours. Do not drive or operate machinery until your health care provider says that it is safe. It is up to you to get the results of your procedure. Ask your health care provider, or the department that is doing the procedure, when your results will be ready. Contact a health care provider if you: Have a sore throat that lasts longer than 1 day. Have a fever. Get help right away if you: Vomit blood or your vomit looks like coffee grounds. Have bloody, black, or tarry stools. Have a very bad sore throat or you cannot swallow. Have difficulty breathing or very bad pain in your chest or abdomen. These symptoms may be an  emergency. Get help right away. Call 911. Do not wait to see if the symptoms will go away. Do not drive yourself to the hospital. Summary Upper endoscopy is a procedure to look inside the upper GI tract. During the procedure, an IV will be inserted into one of your veins. You may be given a medicine to help you relax. The endoscope will be passed through your mouth and down your esophagus. Follow instructions from your health care provider about what you can eat and drink. This information is not intended to replace advice given to you by your health care provider. Make sure you discuss any questions you have with your health care provider. Document Revised: 12/19/2021 Document Reviewed: 12/19/2021 Elsevier Patient Education  [place].

## 2022-05-03 NOTE — Telephone Encounter (Signed)
PA approved for MRI via [employer]. Auth# [id number] , DOS:  05/03/2022 - 07/01/2022

## 2022-05-06 ENCOUNTER — Encounter (HOSPITAL_COMMUNITY): Payer: Self-pay

## 2022-05-06 ENCOUNTER — Ambulatory Visit (HOSPITAL_COMMUNITY)
Admission: RE | Admit: 2022-05-06 | Discharge: 2022-05-06 | Disposition: A | Discharge status: Home / Self Care | Payer: BC Managed Care – PPO | Source: Ambulatory Visit | Attending: Internal Medicine | Admitting: Internal Medicine

## 2022-05-06 ENCOUNTER — Ambulatory Visit (HOSPITAL_COMMUNITY): Payer: BC Managed Care – PPO | Admitting: Anesthesiology

## 2022-05-06 ENCOUNTER — Encounter (HOSPITAL_COMMUNITY)
Admission: RE | Disposition: A | Discharge status: Home / Self Care | Payer: Self-pay | Source: Ambulatory Visit | Attending: Internal Medicine

## 2022-05-06 DIAGNOSIS — Z79899 Other long term (current) drug therapy: Secondary | ICD-10-CM | POA: Diagnosis not present

## 2022-05-06 DIAGNOSIS — K2270 Barrett's esophagus without dysplasia: Secondary | ICD-10-CM

## 2022-05-06 DIAGNOSIS — R112 Nausea with vomiting, unspecified: Secondary | ICD-10-CM | POA: Diagnosis not present

## 2022-05-06 DIAGNOSIS — K219 Gastro-esophageal reflux disease without esophagitis: Secondary | ICD-10-CM | POA: Diagnosis not present

## 2022-05-06 DIAGNOSIS — R634 Abnormal weight loss: Secondary | ICD-10-CM | POA: Insufficient documentation

## 2022-05-06 DIAGNOSIS — F32A Depression, unspecified: Secondary | ICD-10-CM | POA: Diagnosis not present

## 2022-05-06 DIAGNOSIS — R1013 Epigastric pain: Secondary | ICD-10-CM | POA: Insufficient documentation

## 2022-05-06 DIAGNOSIS — I10 Essential (primary) hypertension: Secondary | ICD-10-CM | POA: Diagnosis not present

## 2022-05-06 DIAGNOSIS — K2970 Gastritis, unspecified, without bleeding: Secondary | ICD-10-CM | POA: Diagnosis not present

## 2022-05-06 DIAGNOSIS — K2289 Other specified disease of esophagus: Secondary | ICD-10-CM | POA: Insufficient documentation

## 2022-05-06 DIAGNOSIS — K449 Diaphragmatic hernia without obstruction or gangrene: Secondary | ICD-10-CM | POA: Insufficient documentation

## 2022-05-06 DIAGNOSIS — J449 Chronic obstructive pulmonary disease, unspecified: Secondary | ICD-10-CM | POA: Insufficient documentation

## 2022-05-06 DIAGNOSIS — K222 Esophageal obstruction: Secondary | ICD-10-CM | POA: Insufficient documentation

## 2022-05-06 DIAGNOSIS — F17200 Nicotine dependence, unspecified, uncomplicated: Secondary | ICD-10-CM | POA: Insufficient documentation

## 2022-05-06 HISTORY — PX: BIOPSY: SHX5522

## 2022-05-06 HISTORY — PX: ESOPHAGEAL DILATION: SHX303

## 2022-05-06 HISTORY — PX: ESOPHAGOGASTRODUODENOSCOPY (EGD) WITH PROPOFOL: SHX5813

## 2022-05-06 SURGERY — ESOPHAGOGASTRODUODENOSCOPY (EGD) WITH PROPOFOL
Anesthesia: General

## 2022-05-06 MED ORDER — LIDOCAINE HCL (CARDIAC) PF 100 MG/5ML IV SOSY
PREFILLED_SYRINGE | INTRAVENOUS | Status: DC | PRN
  Administered 2022-05-06: 50 mg via INTRAVENOUS

## 2022-05-06 MED ORDER — LACTATED RINGERS IV SOLN: INTRAVENOUS | Status: DC

## 2022-05-06 MED ORDER — PROPOFOL 10 MG/ML IV BOLUS
INTRAVENOUS | Status: DC | PRN
  Administered 2022-05-06: 80 mg via INTRAVENOUS
  Administered 2022-05-06: 30 mg via INTRAVENOUS
  Administered 2022-05-06: 60 mg via INTRAVENOUS

## 2022-05-06 NOTE — Op Note (Signed)
[facility] Patient Name: [name] Procedure Date: 05/06/2022 7:56 AM MRN: [id number] Date of Birth: 13-Oct-1965 Attending MD: Elon Alas. Abbey Chatters DO CSN: [id number] Age: 56 Admit Type: Outpatient Procedure:                Upper GI endoscopy Indications:              Epigastric abdominal pain, Nausea with vomiting,                            Weight loss Providers:                Elon Alas. Abbey Chatters, DO, Janeece Riggers, RN, Everardo Pacific, Aram Candela Referring MD:              Medicines:                See the Anesthesia note for documentation of the                            administered medications Complications:            No immediate complications. Estimated Blood Loss:     Estimated blood loss was minimal. Procedure:                Pre-Anesthesia Assessment:                           - The anesthesia plan was to use monitored                            anesthesia care (MAC).                           After obtaining informed consent, the endoscope was                            passed under direct vision. Throughout the                            procedure, the patient's blood pressure, pulse, and                            oxygen saturations were monitored continuously. The                            [id number]-H190 ([id number]) scope was introduced through the                            mouth, and advanced to the second part of duodenum.                            The upper GI endoscopy was accomplished without                            difficulty. The patient tolerated the  procedure                            well. Scope In: 8:10:10 AM Scope Out: 8:14:50 AM Total Procedure Duration: 0 hours 4 minutes 40 seconds  Findings:      A 3 cm hiatal hernia was present.      There were esophageal mucosal changes consistent with short-segment       Barrett's esophagus present at the gastroesophageal junction. The       maximum longitudinal extent of these  mucosal changes was 3 cm in length.       Biopsies were taken with a cold forceps for histology.      A mild Schatzki ring was found in the lower third of the esophagus. A       TTS dilator was passed through the scope. Dilation with an 18-19-20 mm       balloon dilator was performed to 18 mm. The dilation site was examined       and showed mild mucosal disruption and moderate improvement in luminal       narrowing.      Patchy mild inflammation characterized by erythema was found in the       entire examined stomach. Biopsies were taken with a cold forceps for       Helicobacter pylori testing.      The duodenal bulb, first portion of the duodenum and second portion of       the duodenum were normal. Impression:               - 3 cm hiatal hernia.                           - Esophageal mucosal changes consistent with                            short-segment Barrett's esophagus. Biopsied.                           - Mild Schatzki ring. Dilated.                           - Gastritis. Biopsied.                           - Normal duodenal bulb, first portion of the                            duodenum and second portion of the duodenum. Moderate Sedation:      Per Anesthesia Care Recommendation:           - Patient has a contact number available for                            emergencies. The signs and symptoms of potential                            delayed complications were discussed with the  patient. Return to normal activities tomorrow.                            Written discharge instructions were provided to the                            patient.                           - Resume previous diet.                           - Continue present medications.                           - Await pathology results.                           - Repeat upper endoscopy PRN for retreatment.                           - Use Pepcid (famotidine) 40 mg PO daily. Ideally                             would place on PPI x12 weeks but may be difficult                            due to previous allergies.                           - Return to GI clinic in 3 months. Procedure Code(s):        --- Professional ---                           [phone], Esophagogastroduodenoscopy, flexible,                            transoral; with transendoscopic balloon dilation of                            esophagus (less than 30 mm diameter)                           43239, 59, Esophagogastroduodenoscopy, flexible,                            transoral; with biopsy, single or multiple Diagnosis Code(s):        --- Professional ---                           K44.9, Diaphragmatic hernia without obstruction or                            gangrene                           K22.8, Other specified  diseases of esophagus                           K22.2, Esophageal obstruction                           K29.70, Gastritis, unspecified, without bleeding                           R10.13, Epigastric pain                           R11.2, Nausea with vomiting, unspecified                           R63.4, Abnormal weight loss CPT copyright 2019 American Medical Association. All rights reserved. The codes documented in this report are preliminary and upon coder review may  be revised to meet current compliance requirements. Elon Alas. Abbey Chatters, DO [place] Abbey Chatters, DO 05/06/2022 8:19:20 AM This report has been signed electronically. Number of Addenda: 0

## 2022-05-06 NOTE — Anesthesia Preprocedure Evaluation (Signed)
Anesthesia Evaluation  Patient identified by MRN, date of birth, ID band Patient awake    Reviewed: Allergy & Precautions, NPO status , Patient's Chart, lab work & pertinent test results  History of Anesthesia Complications (+) PONV and history of anesthetic complications  Airway Mallampati: II  TM Distance: >3 FB Neck ROM: Full  Mouth opening: Limited Mouth Opening  Dental  (+) Dental Advisory Given, Missing   Pulmonary neg shortness of breath, asthma , COPD,  COPD inhaler, Current Smoker and Patient abstained from smoking.,    Pulmonary exam normal breath sounds clear to auscultation       Cardiovascular Exercise Tolerance: Good hypertension, Pt. on medications Normal cardiovascular exam+ dysrhythmias Supra Ventricular Tachycardia  Rhythm:Regular Rate:Normal     Neuro/Psych PSYCHIATRIC DISORDERS Depression TIA   GI/Hepatic Neg liver ROS, GERD  Medicated,  Endo/Other  negative endocrine ROS  Renal/GU negative Renal ROS  negative genitourinary   Musculoskeletal negative musculoskeletal ROS (+)   Abdominal   Peds negative pediatric ROS (+)  Hematology negative hematology ROS (+)   Anesthesia Other Findings   Reproductive/Obstetrics negative OB ROS                            Anesthesia Physical Anesthesia Plan  ASA: 2  Anesthesia Plan: General   Post-op Pain Management: Minimal or no pain anticipated   Induction: Intravenous  PONV Risk Score and Plan: Propofol infusion  Airway Management Planned: Natural Airway and Nasal Cannula  Additional Equipment:   Intra-op Plan:   Post-operative Plan:   Informed Consent: I have reviewed the patients History and Physical, chart, labs and discussed the procedure including the risks, benefits and alternatives for the proposed anesthesia with the patient or authorized representative who has indicated his/her understanding and acceptance.      Dental advisory given  Plan Discussed with: CRNA and Surgeon  Anesthesia Plan Comments:        Anesthesia Quick Evaluation

## 2022-05-06 NOTE — Interval H&P Note (Signed)
History and Physical Interval Note:  05/06/2022 7:59 AM  [name]  has presented today for surgery, with the diagnosis of NAUSEA,VOMITING,WEIGHT LOSS,LIVER LESION.  The various methods of treatment have been discussed with the patient and family. After consideration of risks, benefits and other options for treatment, the patient has consented to  Procedure(s) with comments: ESOPHAGOGASTRODUODENOSCOPY (EGD) WITH PROPOFOL (N/A) - 8:00AM as a surgical intervention.  The patient's history has been reviewed, patient examined, no change in status, stable for surgery.  I have reviewed the patient's chart and labs.  Questions were answered to the patient's satisfaction.     Eloise Harman

## 2022-05-06 NOTE — Discharge Instructions (Signed)
EGD Discharge instructions Please read the instructions outlined below and refer to this sheet in the next few weeks. These discharge instructions provide you with general information on caring for yourself after you leave the hospital. Your doctor may also give you specific instructions. While your treatment has been planned according to the most current medical practices available, unavoidable complications occasionally occur. If you have any problems or questions after discharge, please call your doctor. ACTIVITY You may resume your regular activity but move at a slower pace for the next 24 hours.  Take frequent rest periods for the next 24 hours.  Walking will help expel (get rid of) the air and reduce the bloated feeling in your abdomen.  No driving for 24 hours (because of the anesthesia (medicine) used during the test).  You may shower.  Do not sign any important legal documents or operate any machinery for 24 hours (because of the anesthesia used during the test).  NUTRITION Drink plenty of fluids.  You may resume your normal diet.  Begin with a light meal and progress to your normal diet.  Avoid alcoholic beverages for 24 hours or as instructed by your caregiver.  MEDICATIONS You may resume your normal medications unless your caregiver tells you otherwise.  WHAT YOU CAN EXPECT TODAY You may experience abdominal discomfort such as a feeling of fullness or "gas" pains.  FOLLOW-UP Your doctor will discuss the results of your test with you.  SEEK IMMEDIATE MEDICAL ATTENTION IF ANY OF THE FOLLOWING OCCUR: Excessive nausea (feeling sick to your stomach) and/or vomiting.  Severe abdominal pain and distention (swelling).  Trouble swallowing.  Temperature over 101 F (37.8 C).  Rectal bleeding or vomiting of blood.    Your EGD revealed a small hiatal hernia.  You had a tightening in your esophagus called a Schatzki's ring which I dilated today.  I also took samples of your esophagus.   Your stomach did have moderate amount of inflammation which I also biopsied to rule out infection with a bacteria called H. pylori.  Await pathology results, office will contact you.  Ideally we would place you on a PPI though this may be difficult given your previous allergies to the class of medications.  Continue on famotidine 40 mg twice daily.  Avoid NSAIDs.  Follow-up with GI in 2 to 3 months.  I hope you have a great rest of your week!  Elon Alas. Abbey Chatters, D.O. Gastroenterology and Hepatology [facility]

## 2022-05-06 NOTE — Anesthesia Postprocedure Evaluation (Signed)
Anesthesia Post Note  Patient: [name]  Procedure(s) Performed: ESOPHAGOGASTRODUODENOSCOPY (EGD) WITH PROPOFOL BIOPSY ESOPHAGEAL DILATION  Patient location during evaluation: Phase II Anesthesia Type: General Level of consciousness: awake and alert and oriented Pain management: pain level controlled Vital Signs Assessment: post-procedure vital signs reviewed and stable Respiratory status: spontaneous breathing, nonlabored ventilation and respiratory function stable Cardiovascular status: blood pressure returned to baseline and stable Postop Assessment: no apparent nausea or vomiting Anesthetic complications: no   No notable events documented.   Last Vitals:  Vitals:   05/06/22 0819 05/06/22 0821  BP: (!) 88/46 116/76  Pulse: 80   Resp: (!) 25   Temp: 36.9 C   SpO2: 95% 95%    Last Pain:  Vitals:   05/06/22 0821  TempSrc:   PainSc: 0-No pain                 Dev Dhondt C Jadrien Narine

## 2022-05-06 NOTE — Transfer of Care (Signed)
Immediate Anesthesia Transfer of Care Note  Patient: [name]  Procedure(s) Performed: ESOPHAGOGASTRODUODENOSCOPY (EGD) WITH PROPOFOL BIOPSY ESOPHAGEAL DILATION  Patient Location: Short Stay  Anesthesia Type:MAC  Level of Consciousness: awake, alert , oriented and patient cooperative  Airway & Oxygen Therapy: Patient Spontanous Breathing and Patient connected to nasal cannula oxygen  Post-op Assessment: Report given to RN, Post -op Vital signs reviewed and stable and Patient moving all extremities  Post vital signs: Reviewed and stable  Last Vitals:  Vitals Value Taken Time  BP    Temp    Pulse    Resp    SpO2      Last Pain:  Vitals:   05/06/22 0805  PainSc: 0-No pain         Complications: No notable events documented.

## 2022-05-07 LAB — SURGICAL PATHOLOGY

## 2022-05-09 ENCOUNTER — Encounter (HOSPITAL_COMMUNITY): Payer: Self-pay | Admitting: Internal Medicine

## 2022-05-14 ENCOUNTER — Other Ambulatory Visit: Payer: Self-pay | Admitting: *Deleted

## 2022-05-14 ENCOUNTER — Telehealth: Payer: BC Managed Care – PPO

## 2022-05-14 DIAGNOSIS — R112 Nausea with vomiting, unspecified: Secondary | ICD-10-CM

## 2022-05-14 NOTE — Telephone Encounter (Signed)
Just spoke with patient regarding egd results and pt is wanting to know what the next steps are since she is still vomiting. Please advise.

## 2022-05-14 NOTE — Telephone Encounter (Signed)
Noted  

## 2022-05-14 NOTE — Telephone Encounter (Signed)
Spoke with patient.  Symptoms have improved overall with famotidine 40 mg twice daily, but she continues to have some postprandial nausea and vomiting after eating breakfast around 9 AM.  She gets up at 5 AM and takes her famotidine, but does not eat breakfast until around 9.  Throughout the rest the day, she feels fairly well.  She does take Phenergan at bedtime as she has a little nausea, but no vomiting. She is no longer taking Reglan.   I suspect reflux is playing a role here and options are limited due to allergies to PPIs. Recommended GES. Patient agreeable.   [name]/[name]:  Please arrange GES Dx: Nausea, vomiting

## 2022-05-15 ENCOUNTER — Encounter (HOSPITAL_COMMUNITY): Payer: Self-pay

## 2022-05-15 ENCOUNTER — Ambulatory Visit (HOSPITAL_COMMUNITY): Payer: BC Managed Care – PPO

## 2022-05-15 NOTE — Telephone Encounter (Signed)
Pt informed of GES scheduled for 05/23/22 at 8:30 am. Arrive at 8:15 am, nothing to eat or drink after midnight. Expect to be there for about 4 hours.

## 2022-05-17 ENCOUNTER — Ambulatory Visit (HOSPITAL_COMMUNITY)
Admission: RE | Admit: 2022-05-17 | Discharge: 2022-05-17 | Disposition: A | Discharge status: Home / Self Care | Payer: BC Managed Care – PPO | Source: Ambulatory Visit | Attending: Gastroenterology | Admitting: Gastroenterology

## 2022-05-17 DIAGNOSIS — K769 Liver disease, unspecified: Secondary | ICD-10-CM | POA: Insufficient documentation

## 2022-05-17 DIAGNOSIS — Z9049 Acquired absence of other specified parts of digestive tract: Secondary | ICD-10-CM | POA: Diagnosis not present

## 2022-05-17 MED ORDER — GADOBUTROL 1 MMOL/ML IV SOLN
6.0000 mL | Freq: Once | INTRAVENOUS | Status: AC | PRN
Start: 2022-05-17 — End: 2022-05-17
  Administered 2022-05-17: 6 mL via INTRAVENOUS

## 2022-05-23 ENCOUNTER — Encounter (HOSPITAL_COMMUNITY)
Admission: RE | Admit: 2022-05-23 | Discharge: 2022-05-23 | Disposition: A | Discharge status: Home / Self Care | Payer: BC Managed Care – PPO | Source: Ambulatory Visit | Attending: Gastroenterology | Admitting: Gastroenterology

## 2022-05-23 DIAGNOSIS — R112 Nausea with vomiting, unspecified: Secondary | ICD-10-CM | POA: Diagnosis not present

## 2022-05-23 DIAGNOSIS — R6881 Early satiety: Secondary | ICD-10-CM | POA: Diagnosis not present

## 2022-05-23 MED ORDER — TECHNETIUM TC 99M SULFUR COLLOID
2.0000 | Freq: Once | INTRAVENOUS | Status: AC | PRN
  Administered 2022-05-23: 2.2 via INTRAVENOUS

## 2022-05-28 ENCOUNTER — Encounter (HOSPITAL_BASED_OUTPATIENT_CLINIC_OR_DEPARTMENT_OTHER): Payer: Self-pay | Admitting: Cardiology

## 2022-06-05 DIAGNOSIS — J019 Acute sinusitis, unspecified: Secondary | ICD-10-CM | POA: Diagnosis not present

## 2022-06-05 DIAGNOSIS — J029 Acute pharyngitis, unspecified: Secondary | ICD-10-CM | POA: Diagnosis not present

## 2022-06-25 ENCOUNTER — Encounter (INDEPENDENT_AMBULATORY_CARE_PROVIDER_SITE_OTHER): Payer: Self-pay | Admitting: *Deleted

## 2022-06-26 ENCOUNTER — Ambulatory Visit: Payer: BC Managed Care – PPO | Admitting: Internal Medicine

## 2022-07-17 ENCOUNTER — Encounter: Payer: Self-pay | Admitting: *Deleted

## 2022-07-17 ENCOUNTER — Other Ambulatory Visit: Payer: Self-pay | Admitting: *Deleted

## 2022-07-17 ENCOUNTER — Encounter: Payer: Self-pay | Admitting: Internal Medicine

## 2022-07-17 ENCOUNTER — Ambulatory Visit (INDEPENDENT_AMBULATORY_CARE_PROVIDER_SITE_OTHER): Payer: BC Managed Care – PPO | Admitting: Internal Medicine

## 2022-07-17 VITALS — BP 127/83 | HR 71 | Temp 98.6°F | Ht 62.0 in | Wt 129.0 lb

## 2022-07-17 DIAGNOSIS — K2270 Barrett's esophagus without dysplasia: Secondary | ICD-10-CM

## 2022-07-17 DIAGNOSIS — Z1211 Encounter for screening for malignant neoplasm of colon: Secondary | ICD-10-CM

## 2022-07-17 DIAGNOSIS — K219 Gastro-esophageal reflux disease without esophagitis: Secondary | ICD-10-CM | POA: Diagnosis not present

## 2022-07-17 DIAGNOSIS — D122 Benign neoplasm of ascending colon: Secondary | ICD-10-CM

## 2022-07-17 DIAGNOSIS — R112 Nausea with vomiting, unspecified: Secondary | ICD-10-CM | POA: Diagnosis not present

## 2022-07-17 DIAGNOSIS — R634 Abnormal weight loss: Secondary | ICD-10-CM

## 2022-07-17 MED ORDER — PROMETHAZINE HCL 12.5 MG PO TABS: ORAL_TABLET | ORAL | 3 refills | Status: DC

## 2022-07-17 MED ORDER — PEG 3350-KCL-NA BICARB-NACL 420 G PO SOLR: 4000.0000 mL | Freq: Once | ORAL | 0 refills | Status: AC

## 2022-07-17 NOTE — Progress Notes (Signed)
Referring Provider: Celene Squibb, MD Primary Care Physician:  Celene Squibb, MD Primary GI:  Dr. Abbey Chatters  Chief Complaint  Patient presents with   Vomiting    Follow up on vomiting. Doing some better. Vomiting about once every other day. Losing weight. Appetite not good. Has to force self to eat.     HPI:   [name] is a 56 y.o. female who presents to clinic today for follow-up visit.  History of chronic GERD, recurrent nausea vomiting, intermittent abdominal pain.  Underwent EGD 05/06/2022 which showed 3 cm length Barrett's esophagus, biopsies confirmative.  Gastritis negative for H. pylori.  Numerous allergies to PPI, currently maintained on famotidine 40 mg twice daily.  States her reflux is relatively well controlled.  Main complaint is ongoing nausea and vomiting.  States this is slightly improved.  Happens every 2 to 3 days after eating peanut butter crackers at work.  Does not eat breakfast.  Does have chronic anxiety for which she takes Xanax and Vraylar.  States this is improved though continues to have stress.  All of her symptoms started 3 to 4 months ago.  No longer having abdominal pain.  Gastric emptying study 05/23/2022 unremarkable.  Previously on Reglan which did not help.  Zofran did not help.  Takes Phenergan as needed.  This does help with her nausea though makes her very tired.  Due for surveillance colonoscopy.  Last colonoscopy 2012 with multiple adenomatous colon polyps.  States she went without an insurance for nearly 7 years and would like to schedule this today as she is overdue.  Past Medical History:  Diagnosis Date   Asthma    COPD (chronic obstructive pulmonary disease) ([place])    Depression    Hypertension    PONV (postoperative nausea and vomiting)    Seasonal allergies    TIA (transient ischemic attack) 01/2022    Past Surgical History:  Procedure Laterality Date   ABDOMINAL HYSTERECTOMY     BIOPSY  05/06/2022   Procedure: BIOPSY;   Surgeon: Eloise Harman, DO;  Location: [facility];  Service: Endoscopy;;   CHOLECYSTECTOMY     COLONOSCOPY  05/21/2011   Procedure: COLONOSCOPY;  Surgeon: Rogene Houston, MD;  Location: [facility];  Service: Endoscopy;  Laterality: N/A;  12:30   ESOPHAGEAL DILATION  05/06/2022   Procedure: ESOPHAGEAL DILATION;  Surgeon: Eloise Harman, DO;  Location: [facility];  Service: Endoscopy;;   ESOPHAGOGASTRODUODENOSCOPY     ESOPHAGOGASTRODUODENOSCOPY (EGD) WITH PROPOFOL N/A 05/06/2022   Procedure: ESOPHAGOGASTRODUODENOSCOPY (EGD) WITH PROPOFOL;  Surgeon: Eloise Harman, DO;  Location: [facility];  Service: Endoscopy;  Laterality: N/A;  8:00AM   TUBAL LIGATION      Current Outpatient Medications  Medication Sig Dispense Refill   acetaminophen (TYLENOL) 500 MG tablet Take 1,000 mg by mouth every 6 (six) hours as needed for pain.     albuterol (PROVENTIL HFA;VENTOLIN HFA) 108 (90 BASE) MCG/ACT inhaler Inhale 2 puffs into the lungs every 6 (six) hours as needed for shortness of breath.      ALPRAZolam (XANAX) 0.5 MG tablet Take 0.5 mg by mouth 3 (three) times daily.     famotidine (PEPCID) 40 MG tablet Take 1 tablet (40 mg total) by mouth 2 (two) times daily. 60 tablet 1   spironolactone (ALDACTONE) 25 MG tablet Take 1 tablet (25 mg total) by mouth daily. 90 tablet 3   VRAYLAR 3 MG capsule Take 3 mg by mouth  daily.     metoCLOPramide (REGLAN) 10 MG tablet Take 1 tablet (10 mg total) by mouth every 6 (six) hours. (Patient not taking: Reported on 07/17/2022) 30 tablet 0   nebivolol (BYSTOLIC) 10 MG tablet Take 1 tablet (10 mg total) by mouth daily. (Patient not taking: Reported on 07/17/2022) 90 tablet 3   promethazine (PHENERGAN) 12.5 MG tablet Take 1-2 tablets by mouth every 6 hours as needed for nausea or vomiting. (Patient not taking: Reported on 07/17/2022) 30 tablet 0   No current facility-administered medications for this visit.    Allergies as of 07/17/2022 - Review Complete  07/17/2022  Allergen Reaction Noted   Bee venom Anaphylaxis 03/07/2021   Codeine Nausea And Vomiting and Rash 05/21/2011   Penicillins Nausea And Vomiting and Rash 05/21/2011   Prilosec [omeprazole] Swelling and Rash 03/15/2013   Zantac [ranitidine] Rash 03/07/2021    Family History  Problem Relation Age of Onset   Hypertension Mother    Hyperlipidemia Mother    Stroke Mother    Kidney cancer Father    Colon cancer Other     Social History   Socioeconomic History   Marital status: Married    Spouse name: Not on file   Number of children: 2   Years of education: 12   Highest education level: High school graduate  Occupational History   Occupation: [employer]  Tobacco Use   Smoking status: Every Day    Packs/day: 0.50    Years: 23.00    Total pack years: 11.50    Types: Cigarettes    Passive exposure: Current   Smokeless tobacco: Never  Vaping Use   Vaping Use: Every day  Substance and Sexual Activity   Alcohol use: No   Drug use: No   Sexual activity: Yes    Birth control/protection: Surgical  Other Topics Concern   Not on file  Social History Narrative   Lives at home with husband.   Right-handed.   2 cups of caffeine per day.   Social Determinants of Health   Financial Resource Strain: Not on file  Food Insecurity: Not on file  Transportation Needs: Not on file  Physical Activity: Not on file  Stress: Not on file  Social Connections: Not on file    Subjective: Review of Systems  Constitutional:  Negative for chills and fever.  HENT:  Negative for congestion and hearing loss.   Eyes:  Negative for blurred vision and double vision.  Respiratory:  Negative for cough and shortness of breath.   Cardiovascular:  Negative for chest pain and palpitations.  Gastrointestinal:  Positive for heartburn, nausea and vomiting. Negative for abdominal pain, blood in stool, constipation, diarrhea and melena.  Genitourinary:  Negative for dysuria and urgency.   Musculoskeletal:  Negative for joint pain and myalgias.  Skin:  Negative for itching and rash.  Neurological:  Negative for dizziness and headaches.  Psychiatric/Behavioral:  Negative for depression. The patient is not nervous/anxious.      Objective: BP 127/83 (BP Location: Left Arm, Patient Position: Sitting, Cuff Size: Normal)   Pulse 71   Temp 98.6 F (37 C) (Oral)   Ht '5\' 2"'$  (1.575 m)   Wt 129 lb (58.5 kg)   BMI 23.59 kg/m  Physical Exam Constitutional:      Appearance: Normal appearance.  HENT:     Head: Normocephalic and atraumatic.  Eyes:     Extraocular Movements: Extraocular movements intact.     Conjunctiva/sclera: Conjunctivae normal.  Cardiovascular:  Rate and Rhythm: Normal rate and regular rhythm.  Pulmonary:     Effort: Pulmonary effort is normal.     Breath sounds: Normal breath sounds.  Abdominal:     General: Bowel sounds are normal.     Palpations: Abdomen is soft.  Musculoskeletal:        General: No swelling. Normal range of motion.     Cervical back: Normal range of motion and neck supple.  Skin:    General: Skin is warm and dry.     Coloration: Skin is not jaundiced.  Neurological:     General: No focal deficit present.     Mental Status: She is alert and oriented to person, place, and time.  Psychiatric:        Mood and Affect: Mood normal.        Behavior: Behavior normal.      Assessment: *Nausea and vomiting-recurrent *Chronic GERD-well-controlled *Barrett's esophagus *Adenomatous colon polyps  Plan: Etiology of patient's recurrent nausea and vomiting unclear.  She does have chronic GERD and many allergies to PPIs which limits our options.  Continue famotidine twice daily.  Phenergan refilled today.  Discussed eating breakfast prior to work.  Also could be stress-induced.  Recommend she take her Xanax in the a.m. as well as p.m., currently prescribed 3 times a day though only taking once.  Will schedule for surveillance  colonoscopy.The risks including infection, bleed, or perforation as well as benefits, limitations, alternatives and imponderables have been reviewed with the patient. Questions have been answered. All parties agreeable.  Follow-up after colonoscopy.  07/17/2022 11:47 AM   Disclaimer: This note was dictated with voice recognition software. Similar sounding words can inadvertently be transcribed and may not be corrected upon review.

## 2022-07-17 NOTE — H&P (View-Only) (Signed)
Referring Provider: Celene Squibb, MD Primary Care Physician:  Celene Squibb, MD Primary GI:  Dr. Abbey Chatters  Chief Complaint  Patient presents with   Vomiting    Follow up on vomiting. Doing some better. Vomiting about once every other day. Losing weight. Appetite not good. Has to force self to eat.     HPI:   [name] is a 56 y.o. female who presents to clinic today for follow-up visit.  History of chronic GERD, recurrent nausea vomiting, intermittent abdominal pain.  Underwent EGD 05/06/2022 which showed 3 cm length Barrett's esophagus, biopsies confirmative.  Gastritis negative for H. pylori.  Numerous allergies to PPI, currently maintained on famotidine 40 mg twice daily.  States her reflux is relatively well controlled.  Main complaint is ongoing nausea and vomiting.  States this is slightly improved.  Happens every 2 to 3 days after eating peanut butter crackers at work.  Does not eat breakfast.  Does have chronic anxiety for which she takes Xanax and Vraylar.  States this is improved though continues to have stress.  All of her symptoms started 3 to 4 months ago.  No longer having abdominal pain.  Gastric emptying study 05/23/2022 unremarkable.  Previously on Reglan which did not help.  Zofran did not help.  Takes Phenergan as needed.  This does help with her nausea though makes her very tired.  Due for surveillance colonoscopy.  Last colonoscopy 2012 with multiple adenomatous colon polyps.  States she went without an insurance for nearly 7 years and would like to schedule this today as she is overdue.  Past Medical History:  Diagnosis Date   Asthma    COPD (chronic obstructive pulmonary disease) ([place])    Depression    Hypertension    PONV (postoperative nausea and vomiting)    Seasonal allergies    TIA (transient ischemic attack) 01/2022    Past Surgical History:  Procedure Laterality Date   ABDOMINAL HYSTERECTOMY     BIOPSY  05/06/2022   Procedure: BIOPSY;   Surgeon: Eloise Harman, DO;  Location: [facility];  Service: Endoscopy;;   CHOLECYSTECTOMY     COLONOSCOPY  05/21/2011   Procedure: COLONOSCOPY;  Surgeon: Rogene Houston, MD;  Location: [facility];  Service: Endoscopy;  Laterality: N/A;  12:30   ESOPHAGEAL DILATION  05/06/2022   Procedure: ESOPHAGEAL DILATION;  Surgeon: Eloise Harman, DO;  Location: [facility];  Service: Endoscopy;;   ESOPHAGOGASTRODUODENOSCOPY     ESOPHAGOGASTRODUODENOSCOPY (EGD) WITH PROPOFOL N/A 05/06/2022   Procedure: ESOPHAGOGASTRODUODENOSCOPY (EGD) WITH PROPOFOL;  Surgeon: Eloise Harman, DO;  Location: [facility];  Service: Endoscopy;  Laterality: N/A;  8:00AM   TUBAL LIGATION      Current Outpatient Medications  Medication Sig Dispense Refill   acetaminophen (TYLENOL) 500 MG tablet Take 1,000 mg by mouth every 6 (six) hours as needed for pain.     albuterol (PROVENTIL HFA;VENTOLIN HFA) 108 (90 BASE) MCG/ACT inhaler Inhale 2 puffs into the lungs every 6 (six) hours as needed for shortness of breath.      ALPRAZolam (XANAX) 0.5 MG tablet Take 0.5 mg by mouth 3 (three) times daily.     famotidine (PEPCID) 40 MG tablet Take 1 tablet (40 mg total) by mouth 2 (two) times daily. 60 tablet 1   spironolactone (ALDACTONE) 25 MG tablet Take 1 tablet (25 mg total) by mouth daily. 90 tablet 3   VRAYLAR 3 MG capsule Take 3 mg by mouth  daily.     metoCLOPramide (REGLAN) 10 MG tablet Take 1 tablet (10 mg total) by mouth every 6 (six) hours. (Patient not taking: Reported on 07/17/2022) 30 tablet 0   nebivolol (BYSTOLIC) 10 MG tablet Take 1 tablet (10 mg total) by mouth daily. (Patient not taking: Reported on 07/17/2022) 90 tablet 3   promethazine (PHENERGAN) 12.5 MG tablet Take 1-2 tablets by mouth every 6 hours as needed for nausea or vomiting. (Patient not taking: Reported on 07/17/2022) 30 tablet 0   No current facility-administered medications for this visit.    Allergies as of 07/17/2022 - Review Complete  07/17/2022  Allergen Reaction Noted   Bee venom Anaphylaxis 03/07/2021   Codeine Nausea And Vomiting and Rash 05/21/2011   Penicillins Nausea And Vomiting and Rash 05/21/2011   Prilosec [omeprazole] Swelling and Rash 03/15/2013   Zantac [ranitidine] Rash 03/07/2021    Family History  Problem Relation Age of Onset   Hypertension Mother    Hyperlipidemia Mother    Stroke Mother    Kidney cancer Father    Colon cancer Other     Social History   Socioeconomic History   Marital status: Married    Spouse name: Not on file   Number of children: 2   Years of education: 12   Highest education level: High school graduate  Occupational History   Occupation: [employer]  Tobacco Use   Smoking status: Every Day    Packs/day: 0.50    Years: 23.00    Total pack years: 11.50    Types: Cigarettes    Passive exposure: Current   Smokeless tobacco: Never  Vaping Use   Vaping Use: Every day  Substance and Sexual Activity   Alcohol use: No   Drug use: No   Sexual activity: Yes    Birth control/protection: Surgical  Other Topics Concern   Not on file  Social History Narrative   Lives at home with husband.   Right-handed.   2 cups of caffeine per day.   Social Determinants of Health   Financial Resource Strain: Not on file  Food Insecurity: Not on file  Transportation Needs: Not on file  Physical Activity: Not on file  Stress: Not on file  Social Connections: Not on file    Subjective: Review of Systems  Constitutional:  Negative for chills and fever.  HENT:  Negative for congestion and hearing loss.   Eyes:  Negative for blurred vision and double vision.  Respiratory:  Negative for cough and shortness of breath.   Cardiovascular:  Negative for chest pain and palpitations.  Gastrointestinal:  Positive for heartburn, nausea and vomiting. Negative for abdominal pain, blood in stool, constipation, diarrhea and melena.  Genitourinary:  Negative for dysuria and urgency.   Musculoskeletal:  Negative for joint pain and myalgias.  Skin:  Negative for itching and rash.  Neurological:  Negative for dizziness and headaches.  Psychiatric/Behavioral:  Negative for depression. The patient is not nervous/anxious.      Objective: BP 127/83 (BP Location: Left Arm, Patient Position: Sitting, Cuff Size: Normal)   Pulse 71   Temp 98.6 F (37 C) (Oral)   Ht '5\' 2"'$  (1.575 m)   Wt 129 lb (58.5 kg)   BMI 23.59 kg/m  Physical Exam Constitutional:      Appearance: Normal appearance.  HENT:     Head: Normocephalic and atraumatic.  Eyes:     Extraocular Movements: Extraocular movements intact.     Conjunctiva/sclera: Conjunctivae normal.  Cardiovascular:  Rate and Rhythm: Normal rate and regular rhythm.  Pulmonary:     Effort: Pulmonary effort is normal.     Breath sounds: Normal breath sounds.  Abdominal:     General: Bowel sounds are normal.     Palpations: Abdomen is soft.  Musculoskeletal:        General: No swelling. Normal range of motion.     Cervical back: Normal range of motion and neck supple.  Skin:    General: Skin is warm and dry.     Coloration: Skin is not jaundiced.  Neurological:     General: No focal deficit present.     Mental Status: She is alert and oriented to person, place, and time.  Psychiatric:        Mood and Affect: Mood normal.        Behavior: Behavior normal.      Assessment: *Nausea and vomiting-recurrent *Chronic GERD-well-controlled *Barrett's esophagus *Adenomatous colon polyps  Plan: Etiology of patient's recurrent nausea and vomiting unclear.  She does have chronic GERD and many allergies to PPIs which limits our options.  Continue famotidine twice daily.  Phenergan refilled today.  Discussed eating breakfast prior to work.  Also could be stress-induced.  Recommend she take her Xanax in the a.m. as well as p.m., currently prescribed 3 times a day though only taking once.  Will schedule for surveillance  colonoscopy.The risks including infection, bleed, or perforation as well as benefits, limitations, alternatives and imponderables have been reviewed with the patient. Questions have been answered. All parties agreeable.  Follow-up after colonoscopy.  07/17/2022 11:47 AM   Disclaimer: This note was dictated with voice recognition software. Similar sounding words can inadvertently be transcribed and may not be corrected upon review.

## 2022-07-17 NOTE — Patient Instructions (Addendum)
For your recurrent nausea and vomiting, I recommend eating breakfast in the a.m. prior to work.  Would also take a full dose of your Xanax in the a.m.  Continue Phenergan as needed, you can break this tablet in half.  I have refilled today.  Continue famotidine twice daily.  We will schedule you for colonoscopy for surveillance purposes given your history of polyps prior.  Follow-up with GI after procedure.  It was very nice seeing you again today.  Dr. Abbey Chatters

## 2022-07-18 LAB — BASIC METABOLIC PANEL
BUN: 8 mg/dL (ref 7–25)
CO2: 28 mmol/L (ref 20–32)
Calcium: 9.9 mg/dL (ref 8.6–10.4)
Chloride: 105 mmol/L (ref 98–110)
Creat: 0.92 mg/dL (ref 0.50–1.03)
Glucose, Bld: 114 mg/dL — ABNORMAL HIGH (ref 65–99)
Potassium: 4 mmol/L (ref 3.5–5.3)
Sodium: 144 mmol/L (ref 135–146)

## 2022-07-23 ENCOUNTER — Ambulatory Visit (HOSPITAL_COMMUNITY): Payer: BC Managed Care – PPO | Admitting: Anesthesiology

## 2022-07-23 ENCOUNTER — Other Ambulatory Visit: Payer: Self-pay

## 2022-07-23 ENCOUNTER — Encounter (HOSPITAL_COMMUNITY): Payer: Self-pay

## 2022-07-23 ENCOUNTER — Encounter (HOSPITAL_COMMUNITY)
Admission: RE | Disposition: A | Discharge status: Home / Self Care | Payer: Self-pay | Source: Home / Self Care | Attending: Internal Medicine

## 2022-07-23 ENCOUNTER — Ambulatory Visit (HOSPITAL_COMMUNITY)
Admission: RE | Admit: 2022-07-23 | Discharge: 2022-07-23 | Disposition: A | Discharge status: Home / Self Care | Payer: BC Managed Care – PPO | Attending: Internal Medicine | Admitting: Internal Medicine

## 2022-07-23 DIAGNOSIS — Z09 Encounter for follow-up examination after completed treatment for conditions other than malignant neoplasm: Secondary | ICD-10-CM

## 2022-07-23 DIAGNOSIS — K648 Other hemorrhoids: Secondary | ICD-10-CM | POA: Diagnosis not present

## 2022-07-23 DIAGNOSIS — K5730 Diverticulosis of large intestine without perforation or abscess without bleeding: Secondary | ICD-10-CM | POA: Diagnosis not present

## 2022-07-23 DIAGNOSIS — I10 Essential (primary) hypertension: Secondary | ICD-10-CM | POA: Insufficient documentation

## 2022-07-23 DIAGNOSIS — Z1211 Encounter for screening for malignant neoplasm of colon: Secondary | ICD-10-CM | POA: Insufficient documentation

## 2022-07-23 DIAGNOSIS — D124 Benign neoplasm of descending colon: Secondary | ICD-10-CM | POA: Insufficient documentation

## 2022-07-23 DIAGNOSIS — J449 Chronic obstructive pulmonary disease, unspecified: Secondary | ICD-10-CM | POA: Diagnosis not present

## 2022-07-23 DIAGNOSIS — Z79899 Other long term (current) drug therapy: Secondary | ICD-10-CM | POA: Diagnosis not present

## 2022-07-23 DIAGNOSIS — Z8249 Family history of ischemic heart disease and other diseases of the circulatory system: Secondary | ICD-10-CM | POA: Diagnosis not present

## 2022-07-23 DIAGNOSIS — F17210 Nicotine dependence, cigarettes, uncomplicated: Secondary | ICD-10-CM | POA: Insufficient documentation

## 2022-07-23 DIAGNOSIS — K219 Gastro-esophageal reflux disease without esophagitis: Secondary | ICD-10-CM | POA: Diagnosis not present

## 2022-07-23 DIAGNOSIS — K635 Polyp of colon: Secondary | ICD-10-CM | POA: Diagnosis not present

## 2022-07-23 DIAGNOSIS — F419 Anxiety disorder, unspecified: Secondary | ICD-10-CM | POA: Diagnosis not present

## 2022-07-23 DIAGNOSIS — Z86010 Personal history of colonic polyps: Secondary | ICD-10-CM | POA: Insufficient documentation

## 2022-07-23 DIAGNOSIS — Z8673 Personal history of transient ischemic attack (TIA), and cerebral infarction without residual deficits: Secondary | ICD-10-CM | POA: Diagnosis not present

## 2022-07-23 HISTORY — PX: COLONOSCOPY WITH PROPOFOL: SHX5780

## 2022-07-23 HISTORY — PX: POLYPECTOMY: SHX5525

## 2022-07-23 SURGERY — COLONOSCOPY WITH PROPOFOL
Anesthesia: General

## 2022-07-23 MED ORDER — LACTATED RINGERS IV SOLN: INTRAVENOUS | Status: DC

## 2022-07-23 MED ORDER — LIDOCAINE HCL (CARDIAC) PF 100 MG/5ML IV SOSY
PREFILLED_SYRINGE | INTRAVENOUS | Status: DC | PRN
  Administered 2022-07-23: 50 mg via INTRATRACHEAL

## 2022-07-23 MED ORDER — PROPOFOL 10 MG/ML IV BOLUS
INTRAVENOUS | Status: DC | PRN
  Administered 2022-07-23: 100 mg via INTRAVENOUS

## 2022-07-23 MED ORDER — PROPOFOL 500 MG/50ML IV EMUL
INTRAVENOUS | Status: DC | PRN
  Administered 2022-07-23: 150 ug/kg/min via INTRAVENOUS

## 2022-07-23 NOTE — Transfer of Care (Signed)
Immediate Anesthesia Transfer of Care Note  Patient: [name]  Procedure(s) Performed: COLONOSCOPY WITH PROPOFOL POLYPECTOMY  Patient Location: Endoscopy Unit  Anesthesia Type:General  Level of Consciousness: awake  Airway & Oxygen Therapy: Patient Spontanous Breathing  Post-op Assessment: Report given to RN and Post -op Vital signs reviewed and stable  Post vital signs: Reviewed and stable  Last Vitals:  Vitals Value Taken Time  BP 95/41 07/23/22 1208  Temp 36.6 C 07/23/22 1208  Pulse 80 07/23/22 1208  Resp 19 07/23/22 1208  SpO2 100 % 07/23/22 1208    Last Pain:  Vitals:   07/23/22 1208  TempSrc: Oral  PainSc: 0-No pain      Patients Stated Pain Goal: 8 (04/88/89 1694)  Complications: No notable events documented.

## 2022-07-23 NOTE — Op Note (Signed)
[facility] Patient Name: [name] Procedure Date: 07/23/2022 11:43 AM MRN: [id number] Date of Birth: 1965/11/17 Attending MD: Elon Alas. Abbey Chatters , [place], [id number] CSN: [id number] Age: 56 Admit Type: Outpatient Procedure:                Colonoscopy Indications:              Surveillance: Personal history of colonic polyps                            (unknown histology) on last colonoscopy more than 5                            years ago Providers:                Elon Alas. Abbey Chatters, DO, Lambert Mody, Dereck Leep, Technician Referring MD:              Medicines:                See the Anesthesia note for documentation of the                            administered medications Complications:            No immediate complications. Estimated Blood Loss:     Estimated blood loss was minimal. Procedure:                Pre-Anesthesia Assessment:                           - The anesthesia plan was to use monitored                            anesthesia care (MAC).                           After obtaining informed consent, the colonoscope                            was passed under direct vision. Throughout the                            procedure, the patient's blood pressure, pulse, and                            oxygen saturations were monitored continuously. The                            [id number]-HQ190L ([id number]) scope was introduced through                            the anus and advanced to the the cecum, identified                            by appendiceal orifice and ileocecal valve.  The                            colonoscopy was performed without difficulty. The                            patient tolerated the procedure well. The quality                            of the bowel preparation was evaluated using the                            BBPS [facility] Bowel Preparation Scale) with scores                            of: Right Colon = 2 (minor  amount of residual                            staining, small fragments of stool and/or opaque                            liquid, but mucosa seen well), Transverse Colon = 2                            (minor amount of residual staining, small fragments                            of stool and/or opaque liquid, but mucosa seen                            well) and Left Colon = 2 (minor amount of residual                            staining, small fragments of stool and/or opaque                            liquid, but mucosa seen well). The total BBPS score                            equals 6. The quality of the bowel preparation was                            good. Scope In: 11:52:46 AM Scope Out: 12:05:04 PM Scope Withdrawal Time: 0 hours 9 minutes 12 seconds  Total Procedure Duration: 0 hours 12 minutes 18 seconds  Findings:      The perianal and digital rectal examinations were normal.      Non-bleeding internal hemorrhoids were found during endoscopy.      Multiple large-mouthed and small-mouthed diverticula were found in the       sigmoid colon.      A 5 mm polyp was found in the descending colon. The polyp was sessile.       The polyp was removed with a cold snare. Resection and retrieval were  complete.      The exam was otherwise without abnormality. Impression:               - Non-bleeding internal hemorrhoids.                           - Diverticulosis in the sigmoid colon.                           - One 5 mm polyp in the descending colon, removed                            with a cold snare. Resected and retrieved.                           - The examination was otherwise normal. Moderate Sedation:      Per Anesthesia Care Recommendation:           - Patient has a contact number available for                            emergencies. The signs and symptoms of potential                            delayed complications were discussed with the                             patient. Return to normal activities tomorrow.                            Written discharge instructions were provided to the                            patient.                           - Resume previous diet.                           - Continue present medications.                           - Await pathology results.                           - Repeat colonoscopy in 5 years for surveillance                            and history of polyps prior.                           - Return to GI clinic in 4 months. Procedure Code(s):        --- Professional ---                           [phone], Colonoscopy, flexible; with removal of  tumor(s), polyp(s), or other lesion(s) by snare                            technique Diagnosis Code(s):        --- Professional ---                           Z86.010, Personal history of colonic polyps                           K64.8, Other hemorrhoids                           D12.4, Benign neoplasm of descending colon                           K57.30, Diverticulosis of large intestine without                            perforation or abscess without bleeding CPT copyright 2022 American Medical Association. All rights reserved. The codes documented in this report are preliminary and upon coder review may  be revised to meet current compliance requirements. Elon Alas. Abbey Chatters, DO [place] Abbey Chatters, DO 07/23/2022 12:09:48 PM This report has been signed electronically. Number of Addenda: 0

## 2022-07-23 NOTE — Interval H&P Note (Signed)
History and Physical Interval Note:  07/23/2022 11:15 AM  [name]  has presented today for surgery, with the diagnosis of hx polyps.  The various methods of treatment have been discussed with the patient and family. After consideration of risks, benefits and other options for treatment, the patient has consented to  Procedure(s) with comments: COLONOSCOPY WITH PROPOFOL (N/A) - 2:00pm, asa 2, pt knows to arrive at 11:30 as a surgical intervention.  The patient's history has been reviewed, patient examined, no change in status, stable for surgery.  I have reviewed the patient's chart and labs.  Questions were answered to the patient's satisfaction.     [name]

## 2022-07-23 NOTE — Anesthesia Preprocedure Evaluation (Addendum)
Anesthesia Evaluation  Patient identified by MRN, date of birth, ID band Patient awake    Reviewed: Allergy & Precautions, NPO status , Patient's Chart, lab work & pertinent test results, reviewed documented beta blocker date and time   History of Anesthesia Complications (+) PONV and history of anesthetic complications  Airway Mallampati: II  TM Distance: >3 FB Neck ROM: Full  Mouth opening: Limited Mouth Opening  Dental  (+) Dental Advisory Given, Teeth Intact   Pulmonary neg shortness of breath, asthma , COPD,  COPD inhaler, Current Smoker and Patient abstained from smoking.,    Pulmonary exam normal breath sounds clear to auscultation       Cardiovascular Exercise Tolerance: Good hypertension, Pt. on medications and Pt. on home beta blockers Normal cardiovascular exam+ dysrhythmias Supra Ventricular Tachycardia  Rhythm:Regular Rate:Normal     Neuro/Psych PSYCHIATRIC DISORDERS Depression TIA   GI/Hepatic Neg liver ROS, GERD  Medicated and Controlled,  Endo/Other  negative endocrine ROS  Renal/GU negative Renal ROS  negative genitourinary   Musculoskeletal negative musculoskeletal ROS (+)   Abdominal   Peds negative pediatric ROS (+)  Hematology negative hematology ROS (+)   Anesthesia Other Findings   Reproductive/Obstetrics negative OB ROS                            Anesthesia Physical  Anesthesia Plan  ASA: 3  Anesthesia Plan: General   Post-op Pain Management: Minimal or no pain anticipated   Induction: Intravenous  PONV Risk Score and Plan: Propofol infusion  Airway Management Planned: Natural Airway and Nasal Cannula  Additional Equipment:   Intra-op Plan:   Post-operative Plan:   Informed Consent: I have reviewed the patients History and Physical, chart, labs and discussed the procedure including the risks, benefits and alternatives for the proposed anesthesia with  the patient or authorized representative who has indicated his/her understanding and acceptance.     Dental advisory given  Plan Discussed with: CRNA and Surgeon  Anesthesia Plan Comments:         Anesthesia Quick Evaluation

## 2022-07-23 NOTE — Discharge Instructions (Addendum)
  Colonoscopy Discharge Instructions  Read the instructions outlined below and refer to this sheet in the next few weeks. These discharge instructions provide you with general information on caring for yourself after you leave the hospital. Your doctor may also give you specific instructions. While your treatment has been planned according to the most current medical practices available, unavoidable complications occasionally occur.   ACTIVITY You may resume your regular activity, but move at a slower pace for the next 24 hours.  Take frequent rest periods for the next 24 hours.  Walking will help get rid of the air and reduce the bloated feeling in your belly (abdomen).  No driving for 24 hours (because of the medicine (anesthesia) used during the test).   Do not sign any important legal documents or operate any machinery for 24 hours (because of the anesthesia used during the test).  NUTRITION Drink plenty of fluids.  You may resume your normal diet as instructed by your doctor.  Begin with a light meal and progress to your normal diet. Heavy or fried foods are harder to digest and may make you feel sick to your stomach (nauseated).  Avoid alcoholic beverages for 24 hours or as instructed.  MEDICATIONS You may resume your normal medications unless your doctor tells you otherwise.  WHAT YOU CAN EXPECT TODAY Some feelings of bloating in the abdomen.  Passage of more gas than usual.  Spotting of blood in your stool or on the toilet paper.  IF YOU HAD POLYPS REMOVED DURING THE COLONOSCOPY: No aspirin products for 7 days or as instructed.  No alcohol for 7 days or as instructed.  Eat a soft diet for the next 24 hours.  FINDING OUT THE RESULTS OF YOUR TEST Not all test results are available during your visit. If your test results are not back during the visit, make an appointment with your caregiver to find out the results. Do not assume everything is normal if you have not heard from your  caregiver or the medical facility. It is important for you to follow up on all of your test results.  SEEK IMMEDIATE MEDICAL ATTENTION IF: You have more than a spotting of blood in your stool.  Your belly is swollen (abdominal distention).  You are nauseated or vomiting.  You have a temperature over 101.  You have abdominal pain or discomfort that is severe or gets worse throughout the day.   Your colonoscopy revealed 1 polyp(s) which I removed successfully. Await pathology results, my office will contact you. I recommend repeating colonoscopy in 5 years for surveillance purposes.   You also have diverticulosis and internal hemorrhoids. I would recommend increasing fiber in your diet or adding OTC Benefiber/Metamucil. Be sure to drink at least 4 to 6 glasses of water daily. Follow-up with Dr. Abbey Chatters in 4 months   I hope you have a great rest of your week!  Elon Alas. Abbey Chatters, D.O. Gastroenterology and Hepatology [facility]

## 2022-07-23 NOTE — Anesthesia Postprocedure Evaluation (Signed)
Anesthesia Post Note  Patient: [name]  Procedure(s) Performed: COLONOSCOPY WITH PROPOFOL POLYPECTOMY  Patient location during evaluation: Phase II Anesthesia Type: General Level of consciousness: awake and alert and oriented Pain management: pain level controlled Vital Signs Assessment: post-procedure vital signs reviewed and stable Respiratory status: spontaneous breathing, nonlabored ventilation and respiratory function stable Cardiovascular status: blood pressure returned to baseline and stable Postop Assessment: no apparent nausea or vomiting Anesthetic complications: no   No notable events documented.   Last Vitals:  Vitals:   07/23/22 1121 07/23/22 1208  BP: (!) 144/89 (!) 95/41  Pulse: 80 80  Resp: 12 19  Temp: 36.7 C 36.6 C  SpO2: 98% 100%    Last Pain:  Vitals:   07/23/22 1208  TempSrc: Oral  PainSc: 0-No pain                 [name] C Omare Bilotta

## 2022-07-24 LAB — SURGICAL PATHOLOGY

## 2022-08-05 ENCOUNTER — Encounter (HOSPITAL_COMMUNITY): Payer: Self-pay | Admitting: Internal Medicine

## 2022-08-07 ENCOUNTER — Telehealth: Payer: BC Managed Care – PPO | Admitting: Gastroenterology

## 2022-09-06 ENCOUNTER — Encounter (HOSPITAL_BASED_OUTPATIENT_CLINIC_OR_DEPARTMENT_OTHER): Payer: Self-pay | Admitting: Cardiology

## 2022-09-06 ENCOUNTER — Ambulatory Visit (INDEPENDENT_AMBULATORY_CARE_PROVIDER_SITE_OTHER): Payer: BC Managed Care – PPO | Admitting: Cardiology

## 2022-09-06 VITALS — BP 127/82 | HR 82 | Ht 62.0 in | Wt 124.3 lb

## 2022-09-06 DIAGNOSIS — I10 Essential (primary) hypertension: Secondary | ICD-10-CM | POA: Diagnosis not present

## 2022-09-06 DIAGNOSIS — R634 Abnormal weight loss: Secondary | ICD-10-CM

## 2022-09-06 DIAGNOSIS — Z5181 Encounter for therapeutic drug level monitoring: Secondary | ICD-10-CM | POA: Diagnosis not present

## 2022-09-06 DIAGNOSIS — R002 Palpitations: Secondary | ICD-10-CM | POA: Diagnosis not present

## 2022-09-06 DIAGNOSIS — Z716 Tobacco abuse counseling: Secondary | ICD-10-CM

## 2022-09-06 NOTE — Progress Notes (Signed)
Cardiology Office Note:    Date:  09/06/2022   ID:  [name], DOB 12/12/1965, MRN [id number]  PCP:  Celene Squibb, MD  Cardiologist:  Buford Dresser, MD PhD  Referring MD: Celene Squibb, MD   CC: follow up  History of Present Illness:    [name] is a 56 y.o. female with a hx of tobacco use, hypertension who is seen for follow up today. I initially met her 05/25/2019 as a new consult at the request of Celene Squibb, MD for the evaluation and management of dizziness, palpitations, left arm/chest tightness.  Her blood pressures had started to elevate intermittently with minimal activity. Lowest BP she had seen was ~90/70, but most in 130s and occasionally 150s. Had not required PRN hydralazine. Her BP would improve when she relaxed, dropped >20 points.  We reviewed the lab results as follows: The catecholamine test is reassuring. Numbers were very normal and some low. This suggests against pheochromocytoma, though there is a small chance that we missed some spikes. We could consider a 24 hour urine test to make sure we aren't missing anything.   The aldosterone/renin test was mixed. The renin level was very low, suggesting that it is being suppressed, but the aldosterone was normal. Typically when we are thinking about hyperaldosteronism, the aldosterone will be much higher and the renin will be low. However, the edarbi can affect these numbers. Because of this, it is more likely that you DO have elevated aldosterone with these numbers while on edarbi.   She was trialed on spironolactone. After three weeks of spironolactone, her blood pressure had bottomed out to 88 systolic. Her blood pressure averaged around 108, 111. She felt worse at 160 systolic or lower. She was experiencing dizziness, especially after bending over. She was taking Reglan for nausea, Bystolic, spironolactone, and vraylar. At her last visit she also complained of occasional palpitations, especially in hot  weather.  Today, she is accompanied by a family member. Yesterday and today she was experiencing chest palpitations which she described as "flutters." Prior to that her palpitations had not occurred for at least 5 months. Her blood pressure at home was 178/98 and her HR was 98 bpm. Her blood pressure has been in the 170s-180s intermittently for the past few weeks. When her blood pressure is above 737 systolic she starts feeling worse. She also complains of a headache today. Typically she takes tylenol for her headaches.   She continues to lose weight which she attributes to her appetite being suppressed for the past 6 months. Her weight 6 months ago was 164 lbs; currently 124 lbs. At one time she had lost 8 lbs in 4 weeks. Her appetite has improved slightly since onset and she is regularly drinking Boost. She was previously experiencing vomiting spells that would prevent her from eating. She denies pain when eating.   In addition to her weight, she complains of being off balance and lightheadedness. Her lightheadedness is worse when she wakes up.   She denies any chest pain, shortness of breath, or peripheral edema. No syncope, orthopnea, or PND.      Past Medical History:  Diagnosis Date   Asthma    COPD (chronic obstructive pulmonary disease) (HCC)    Depression    Hypertension    PONV (postoperative nausea and vomiting)    Seasonal allergies    TIA (transient ischemic attack) 01/2022    Past Surgical History:  Procedure Laterality Date   ABDOMINAL HYSTERECTOMY  BIOPSY  05/06/2022   Procedure: BIOPSY;  Surgeon: Eloise Harman, DO;  Location: [facility];  Service: Endoscopy;;   CHOLECYSTECTOMY     COLONOSCOPY  05/21/2011   Procedure: COLONOSCOPY;  Surgeon: Rogene Houston, MD;  Location: [facility];  Service: Endoscopy;  Laterality: N/A;  12:30   COLONOSCOPY WITH PROPOFOL N/A 07/23/2022   Procedure: COLONOSCOPY WITH PROPOFOL;  Surgeon: Eloise Harman, DO;  Location: [facility];  Service: Endoscopy;  Laterality: N/A;  2:00pm, asa 2, pt knows to arrive at 11:30   ESOPHAGEAL DILATION  05/06/2022   Procedure: ESOPHAGEAL DILATION;  Surgeon: Eloise Harman, DO;  Location: [facility];  Service: Endoscopy;;   ESOPHAGOGASTRODUODENOSCOPY     ESOPHAGOGASTRODUODENOSCOPY (EGD) WITH PROPOFOL N/A 05/06/2022   Procedure: ESOPHAGOGASTRODUODENOSCOPY (EGD) WITH PROPOFOL;  Surgeon: Eloise Harman, DO;  Location: [facility];  Service: Endoscopy;  Laterality: N/A;  8:00AM   POLYPECTOMY  07/23/2022   Procedure: POLYPECTOMY;  Surgeon: Eloise Harman, DO;  Location: [facility];  Service: Endoscopy;;   TUBAL LIGATION      Current Medications: Current Outpatient Medications on File Prior to Visit  Medication Sig   acetaminophen (TYLENOL) 650 MG CR tablet Take 1,300 mg by mouth every 8 (eight) hours as needed for pain.   albuterol (PROVENTIL HFA;VENTOLIN HFA) 108 (90 BASE) MCG/ACT inhaler Inhale 2 puffs into the lungs every 6 (six) hours as needed for shortness of breath.    ALPRAZolam (XANAX) 0.5 MG tablet Take 0.5 mg by mouth 3 (three) times daily.   famotidine (PEPCID) 40 MG tablet Take 1 tablet (40 mg total) by mouth 2 (two) times daily.   nebivolol (BYSTOLIC) 10 MG tablet Take 10 mg by mouth daily.   promethazine (PHENERGAN) 12.5 MG tablet Take 1-2 tablets by mouth every 6 hours as needed for nausea or vomiting.   spironolactone (ALDACTONE) 25 MG tablet Take 1 tablet (25 mg total) by mouth daily.   VRAYLAR 3 MG capsule Take 3 mg by mouth daily.   No current facility-administered medications on file prior to visit.     Allergies:   Bee venom, Ibuprofen, Codeine, Penicillins, Prilosec [omeprazole], and Zantac [ranitidine]   Social History   Tobacco Use   Smoking status: Every Day    Packs/day: 0.50    Years: 23.00    Total pack years: 11.50    Types: Cigarettes    Passive exposure: Current   Smokeless tobacco: Never  Vaping Use   Vaping Use: Every  day  Substance Use Topics   Alcohol use: No   Drug use: No      Family History: The patient's family history includes Colon cancer in an other family member; Hyperlipidemia in her mother; Hypertension in her mother; Kidney cancer in her father; Stroke in her mother. mother had a stroke at age 65, 8 had CHF, [name] had heart and blood pressure problems. Everyone else has cancer.  ROS:   Please see the history of present illness.   (+) palpitations (+) headaches (+) vomiting (+) unintentional weight loss (+) lightheadedness (+) wheezing Additional pertinent ROS otherwise unremarkable.   EKGs/Labs/Other Studies Reviewed:    The following studies were reviewed today:  Echo 01/02/22  1. Left ventricular ejection fraction, by estimation, is 50 to 55%. The  left ventricle has low normal function. The left ventricle has no regional  wall motion abnormalities. Left ventricular diastolic parameters were  normal.   2. Right ventricular systolic  function is normal. The right ventricular  size is normal. There is normal pulmonary artery systolic pressure.   3. The mitral valve is normal in structure. No evidence of mitral valve  regurgitation. No evidence of mitral stenosis.   4. The aortic valve is tricuspid. Aortic valve regurgitation is not  visualized. No aortic stenosis is present.   5. The inferior vena cava is normal in size with greater than 50%  respiratory variability, suggesting right atrial pressure of 3 mmHg.   Comparison(s): No prior Echocardiogram.   CTA Head/Neck 01/01/22 IMPRESSION: Patent vasculature of the head and neck with no significant stenosis or occlusion.  CT Head 01/01/22 IMPRESSION: 1. No acute intracranial pathology. 2. ASPECTS is 10 3. Empty sella. This finding is nonspecific and could reflect a normal variant common but can also be seen in the setting of intracranial hypertension.  Blood Pressure Monitor 07/09/21 24 ambulatory  summary: Mean BP 132/77, HR 81 Awake average 140/84, HR 82 Asleep average 111/62, HR 78   Dip with sleep: systolic 18%, diastolic 56%   Max systolic: 314 Max diastolic: 970   Normal BP variability, average BP near goal.  CT coronary 06/16/2019 FINDINGS: Coronary calcium score: The patient's coronary artery calcium score is 0, which places the patient in the 0 percentile.   Coronary arteries: Normal coronary origins.  Right dominance.   Right Coronary Artery: Medium caliber vessel that give rise to PLB and PDA. No significant plaque or stenosis.   Left Main Coronary Artery: No significant plaque or stenosis.   Left Anterior Descending Coronary Artery: Gives rise to two medium sized diagonals. No significant plaque or stenosis in LAD or branch vessels.   Left Circumflex Artery: Gives rise to two large OM branches. No significant plaque or stenosis in LCx or branch vessels.   Aorta: Normal size, 27 mm at the mid ascending aorta (level of the PA bifurcation) measured double oblique. No calcifications. No dissection.   Aortic Valve: No calcifications. Trileaflet.   Other findings:  Normal pulmonary vein drainage into the left atrium.  Normal left atrial appendage without a thrombus.  Normal size of the pulmonary artery.   IMPRESSION: 1. No evidence of CAD, CADRADS = 0.  2. Coronary calcium score of 0. This was 0 percentile for age and sex matched control.  3. Normal coronary origin with right dominance.  Event monitor 08/30/2019 12 days of data recorded on Zio monitor. Patient had a min HR of 58 bpm, max HR of 184 bpm, and avg HR of 82 bpm. Predominant underlying rhythm was Sinus Rhythm. No VT, atrial fibrillation, high degree block, or pauses noted. Isolated atrial and ventricular ectopy was rare (<1%). There were 19 triggered events. There were 6 SVT events noted, all very brief (longest 5 beats). These were detected near patient triggered event markers.  Carotid u/s  06/07/2019 RIGHT CAROTID ARTERY: No significant calcifications of the right common carotid artery. Intermediate waveform maintained. Heterogeneous and partially calcified plaque at the right carotid bifurcation. No significant lumen shadowing. Low resistance waveform of the right ICA. No significant tortuosity.   RIGHT VERTEBRAL ARTERY: Antegrade flow with low resistance waveform.   LEFT CAROTID ARTERY: No significant calcifications of the left common carotid artery. Intermediate waveform maintained. Heterogeneous and partially calcified plaque at the left carotid bifurcation without significant lumen shadowing. Low resistance waveform of the left ICA. No significant tortuosity.   LEFT VERTEBRAL ARTERY:  Antegrade flow with low resistance waveform.   IMPRESSION: Color duplex indicates minimal heterogeneous  and calcified plaque, with no hemodynamically significant stenosis by duplex criteria in the extracranial cerebrovascular circulation.  EKG:  EKG was personally reviewed today 09/06/2022: NSR at 82 bpm 04/15/2022: EKG was not ordered. 01/10/2022: not ordered 03/07/21 NSR 81 bpm  Recent Labs: 01/01/2022: TSH 0.911 04/11/2022: ALT 17; Hemoglobin 16.6; Platelets ACLMP 07/17/2022: BUN 8; Creat 0.92; Potassium 4.0; Sodium 144   Recent Lipid Panel    Component Value Date/Time   CHOL 144 01/02/2022 0447   TRIG 86 01/02/2022 0447   HDL 48 01/02/2022 0447   CHOLHDL 3.0 01/02/2022 0447   VLDL 17 01/02/2022 0447   LDLCALC 79 01/02/2022 0447    Physical Exam:    VS:  BP 127/82 (BP Location: Left Arm, Patient Position: Sitting, Cuff Size: Normal)   Pulse 82   Ht '5\' 2"'  (1.575 m)   Wt 124 lb 4.8 oz (56.4 kg)   BMI 22.73 kg/m     Wt Readings from Last 3 Encounters:  09/06/22 124 lb 4.8 oz (56.4 kg)  07/23/22 129 lb (58.5 kg)  07/17/22 129 lb (58.5 kg)    GEN: Well nourished, well developed in no acute distress HEENT: Normal, moist mucous membranes NECK: No JVD CARDIAC:  regular rhythm, normal S1 and S2, no rubs or gallops. No murmur. VASCULAR: Radial and DP pulses 2+ bilaterally. No carotid bruits RESPIRATORY:  Largely clear, faint wheezing bilaterally ABDOMEN: Soft, non-tender, non-distended MUSCULOSKELETAL:  Ambulates independently SKIN: Warm and dry, no edema NEUROLOGIC:  Alert and oriented x 3. No focal neuro deficits noted. PSYCHIATRIC:  Normal affect    ASSESSMENT:    1. Essential hypertension   2. Therapeutic drug monitoring   3. Palpitation   4. Unintentional weight loss   5. Tobacco abuse counseling    PLAN:    Unintentional weight loss vomiting -unclear etiology of this, she is undergoing evaluation -poor oral nutrition may also be affecting her intermittent hypotension. I am very concerned about this. Check BMET for electrolytes  Transient neuro symptoms with elevated BP Labile hypertension Concern for orthostatic hypotension, with orthostatic lightheadedness -BP low with initiation of spironolactone (may be exacerbated by poor PO intake, as above) -BP now 127/82 on spironolactone and nebivolol alone -she is not currently taking azilsartan, ok to remain off of this  Palpitations:  -managed on bystolic current dose -see monitor as above  Tobacco use counseling: working to cut back  CV prevention: -calcium score 0, no CAD on CT 2020 -she has stopped aspirin, atorvastatin. Ok to remain off of these, can reassess in the future  Cardiac risk counseling and prevention recommendations: -recommend heart healthy/Mediterranean diet, with whole grains, fruits, vegetable, fish, lean meats, nuts, and olive oil. Limit salt. -recommend moderate walking, 3-5 times/week for 30-50 minutes each session. Aim for at least 150 minutes.week. Goal should be pace of 3 miles/hours, or walking 1.5 miles in 30 minutes -recommend avoidance of tobacco products. Avoid excess alcohol.  Plan for follow up: 4 weeks or sooner as needed.  Buford Dresser, MD, PhD, [facility]    Medication Adjustments/Labs and Tests Ordered: Current medicines are reviewed at length with the patient today.  Concerns regarding medicines are outlined above.   Orders Placed This Encounter  Procedures   Basic metabolic panel   EKG [id number]   No orders of the defined types were placed in this encounter.  Patient Instructions  Medication Instructions:  Your physician recommends that you continue on your current medications as directed. Please refer to  the Current Medication list given to you today.   Labwork: BMET SOON   Testing/Procedures: NONE  Follow-Up: 10/11/2022 3:20 WITH DR Harrell Gave   Any Other Special Instructions Will Be Listed Below (If Applicable).  FOLLOW UP WITH YOUR PRIMARY SOON    If you need a refill on your cardiac medications before your next appointment, please call your pharmacy.   I,Mathew Stumpf,acting as a Education administrator for [employer], MD.,have documented all relevant documentation on the behalf of Buford Dresser, MD,as directed by  Buford Dresser, MD while in the presence of Buford Dresser, MD.  I, Buford Dresser, MD, have reviewed all documentation for this visit. The documentation on 09/06/22 for the exam, diagnosis, procedures, and orders are all accurate and complete.   Signed, Buford Dresser, MD PhD 09/06/2022     [place]

## 2022-09-06 NOTE — Patient Instructions (Addendum)
Medication Instructions:  Your physician recommends that you continue on your current medications as directed. Please refer to the Current Medication list given to you today.   Labwork: BMET SOON   Testing/Procedures: NONE  Follow-Up: 10/11/2022 3:20 WITH DR Harrell Gave   Any Other Special Instructions Will Be Listed Below (If Applicable).  FOLLOW UP WITH YOUR PRIMARY SOON    If you need a refill on your cardiac medications before your next appointment, please call your pharmacy.

## 2022-09-09 DIAGNOSIS — R030 Elevated blood-pressure reading, without diagnosis of hypertension: Secondary | ICD-10-CM | POA: Diagnosis not present

## 2022-09-09 DIAGNOSIS — J00 Acute nasopharyngitis [common cold]: Secondary | ICD-10-CM | POA: Diagnosis not present

## 2022-09-09 DIAGNOSIS — I10 Essential (primary) hypertension: Secondary | ICD-10-CM | POA: Diagnosis not present

## 2022-09-09 DIAGNOSIS — R634 Abnormal weight loss: Secondary | ICD-10-CM | POA: Diagnosis not present

## 2022-09-09 DIAGNOSIS — J3489 Other specified disorders of nose and nasal sinuses: Secondary | ICD-10-CM | POA: Diagnosis not present

## 2022-09-09 DIAGNOSIS — R002 Palpitations: Secondary | ICD-10-CM | POA: Diagnosis not present

## 2022-09-28 DIAGNOSIS — H748X3 Other specified disorders of middle ear and mastoid, bilateral: Secondary | ICD-10-CM | POA: Diagnosis not present

## 2022-09-28 DIAGNOSIS — H748X9 Other specified disorders of middle ear and mastoid, unspecified ear: Secondary | ICD-10-CM | POA: Diagnosis not present

## 2022-09-28 DIAGNOSIS — F17210 Nicotine dependence, cigarettes, uncomplicated: Secondary | ICD-10-CM | POA: Diagnosis not present

## 2022-10-01 ENCOUNTER — Encounter: Payer: Self-pay | Admitting: Internal Medicine

## 2022-10-10 ENCOUNTER — Emergency Department (HOSPITAL_COMMUNITY)
Admission: EM | Admit: 2022-10-10 | Discharge: 2022-10-10 | Disposition: A | Discharge status: Home / Self Care | Payer: BC Managed Care – PPO | Attending: Emergency Medicine | Admitting: Emergency Medicine

## 2022-10-10 ENCOUNTER — Encounter (HOSPITAL_COMMUNITY): Payer: Self-pay | Admitting: Emergency Medicine

## 2022-10-10 ENCOUNTER — Other Ambulatory Visit: Payer: Self-pay

## 2022-10-10 DIAGNOSIS — D582 Other hemoglobinopathies: Secondary | ICD-10-CM | POA: Diagnosis not present

## 2022-10-10 DIAGNOSIS — J45909 Unspecified asthma, uncomplicated: Secondary | ICD-10-CM | POA: Insufficient documentation

## 2022-10-10 DIAGNOSIS — F17210 Nicotine dependence, cigarettes, uncomplicated: Secondary | ICD-10-CM | POA: Diagnosis not present

## 2022-10-10 DIAGNOSIS — Z79899 Other long term (current) drug therapy: Secondary | ICD-10-CM | POA: Insufficient documentation

## 2022-10-10 DIAGNOSIS — Z8673 Personal history of transient ischemic attack (TIA), and cerebral infarction without residual deficits: Secondary | ICD-10-CM | POA: Insufficient documentation

## 2022-10-10 DIAGNOSIS — R1084 Generalized abdominal pain: Secondary | ICD-10-CM | POA: Insufficient documentation

## 2022-10-10 DIAGNOSIS — J449 Chronic obstructive pulmonary disease, unspecified: Secondary | ICD-10-CM | POA: Diagnosis not present

## 2022-10-10 DIAGNOSIS — D72829 Elevated white blood cell count, unspecified: Secondary | ICD-10-CM | POA: Diagnosis not present

## 2022-10-10 DIAGNOSIS — I10 Essential (primary) hypertension: Secondary | ICD-10-CM | POA: Insufficient documentation

## 2022-10-10 DIAGNOSIS — R1013 Epigastric pain: Secondary | ICD-10-CM | POA: Diagnosis not present

## 2022-10-10 DIAGNOSIS — R5383 Other fatigue: Secondary | ICD-10-CM | POA: Diagnosis not present

## 2022-10-10 DIAGNOSIS — R112 Nausea with vomiting, unspecified: Secondary | ICD-10-CM | POA: Insufficient documentation

## 2022-10-10 LAB — COMPREHENSIVE METABOLIC PANEL
ALT: 22 U/L (ref 0–44)
AST: 24 U/L (ref 15–41)
Albumin: 4.7 g/dL (ref 3.5–5.0)
Alkaline Phosphatase: 54 U/L (ref 38–126)
Anion gap: 12 (ref 5–15)
BUN: 15 mg/dL (ref 6–20)
CO2: 27 mmol/L (ref 22–32)
Calcium: 9.3 mg/dL (ref 8.9–10.3)
Chloride: 96 mmol/L — ABNORMAL LOW (ref 98–111)
Creatinine, Ser: 0.86 mg/dL (ref 0.44–1.00)
GFR, Estimated: >60 mL/min (ref >60)
Glucose, Bld: 108 mg/dL — ABNORMAL HIGH (ref 70–99)
Potassium: 4 mmol/L (ref 3.5–5.1)
Sodium: 135 mmol/L (ref 135–145)
Total Bilirubin: 2.1 mg/dL — ABNORMAL HIGH (ref 0.3–1.2)
Total Protein: 7.7 g/dL (ref 6.5–8.1)

## 2022-10-10 LAB — URINALYSIS, ROUTINE W REFLEX MICROSCOPIC
Bilirubin Urine: NEGATIVE
Glucose, UA: NEGATIVE mg/dL
Hgb urine dipstick: NEGATIVE
Ketones, ur: 20 mg/dL — AB
Leukocytes,Ua: NEGATIVE
Nitrite: NEGATIVE
Protein, ur: NEGATIVE mg/dL
Specific Gravity, Urine: 1.011 (ref 1.005–1.030)
pH: 5 (ref 5.0–8.0)

## 2022-10-10 LAB — LIPASE, BLOOD: Lipase: 34 U/L (ref 11–51)

## 2022-10-10 LAB — CBC WITH DIFFERENTIAL/PLATELET
Abs Immature Granulocytes: 0.04 10*3/uL (ref 0.00–0.07)
Basophils Absolute: 0.1 10*3/uL (ref 0.0–0.1)
Basophils Relative: 1 %
Eosinophils Absolute: 0.1 10*3/uL (ref 0.0–0.5)
Eosinophils Relative: 1 %
HCT: 46.5 % — ABNORMAL HIGH (ref 36.0–46.0)
Hemoglobin: 15.9 g/dL — ABNORMAL HIGH (ref 12.0–15.0)
Immature Granulocytes: 0 %
Lymphocytes Relative: 23 %
Lymphs Abs: 3 10*3/uL (ref 0.7–4.0)
MCH: 32.6 pg (ref 26.0–34.0)
MCHC: 34.2 g/dL (ref 30.0–36.0)
MCV: 95.5 fL (ref 80.0–100.0)
Monocytes Absolute: 0.7 10*3/uL (ref 0.1–1.0)
Monocytes Relative: 5 %
Neutro Abs: 9.5 10*3/uL — ABNORMAL HIGH (ref 1.7–7.7)
Neutrophils Relative %: 70 %
Platelets: 210 10*3/uL (ref 150–400)
RBC: 4.87 MIL/uL (ref 3.87–5.11)
RDW: 13.2 % (ref 11.5–15.5)
WBC: 13.4 10*3/uL — ABNORMAL HIGH (ref 4.0–10.5)
nRBC: 0 % (ref 0.0–0.2)

## 2022-10-10 LAB — MAGNESIUM: Magnesium: 2.2 mg/dL (ref 1.7–2.4)

## 2022-10-10 MED ORDER — METOCLOPRAMIDE HCL 5 MG/ML IJ SOLN
10.0000 mg | Freq: Once | INTRAMUSCULAR | Status: AC
  Administered 2022-10-10: 10 mg via INTRAVENOUS
  Filled 2022-10-10: qty 2

## 2022-10-10 MED ORDER — SODIUM CHLORIDE 0.9 % IV BOLUS
1000.0000 mL | Freq: Once | INTRAVENOUS | Status: AC
  Administered 2022-10-10: 1000 mL via INTRAVENOUS

## 2022-10-10 MED ORDER — LIDOCAINE VISCOUS HCL 2 % MT SOLN
15.0000 mL | Freq: Once | OROMUCOSAL | Status: AC
  Administered 2022-10-10: 15 mL via ORAL
  Filled 2022-10-10: qty 15

## 2022-10-10 MED ORDER — METOCLOPRAMIDE HCL 10 MG PO TABS: 10.0000 mg | ORAL_TABLET | Freq: Four times a day (QID) | ORAL | 0 refills | Status: DC

## 2022-10-10 MED ORDER — ALUM & MAG HYDROXIDE-SIMETH 200-200-20 MG/5ML PO SUSP
30.0000 mL | Freq: Once | ORAL | Status: AC
  Administered 2022-10-10: 30 mL via ORAL
  Filled 2022-10-10: qty 30

## 2022-10-10 NOTE — ED Triage Notes (Signed)
Patient endorses n/v x 2 days.  Patient has a history of same and feels like she is dehydrated.  Patient also endorses decreased urine output.

## 2022-10-10 NOTE — ED Provider Triage Note (Signed)
Emergency Medicine Provider Triage Evaluation Note  [name] , a 57 y.o. female  was evaluated in triage.  Pt complains of nausea and vomiting with abdominal soreness.  Patient states that her problems have been going on "for months".  They have been worse over the past 2 days, controlled at home.  She states that she is taking Pepcid and taking Phenergan for symptoms.  She has primary care as well as a GI.  She has had CT with contrast which reports did not show any problems.  She has had upper endoscopy as well as colonoscopy and unfortunately have not been able to find out what causes her symptoms.  She states that she feels dehydrated now.  She states that her skin is tenting.  She felt lightheaded today, prompting ED evaluation.  Review of Systems  Positive: Nausea vomiting Negative: Fever  Physical Exam  BP 126/87 (BP Location: Right Arm)   Pulse 87   Temp 98.2 F (36.8 C) (Oral)   Resp 20   Ht '5\' 2"'$  (1.575 m)   Wt 54.6 kg   SpO2 98%   BMI 22.02 kg/m  Gen:   Awake, no distress   Resp:  Normal effort  MSK:   Moves extremities without difficulty  Other:    Medical Decision Making  Medically screening exam initiated at 1:29 PM.  Appropriate orders placed.  [name] was informed that the remainder of the evaluation will be completed by another provider, this initial triage assessment does not replace that evaluation, and the importance of remaining in the ED until their evaluation is complete.     Carlisle Cater, PA-C 10/10/22 1330

## 2022-10-10 NOTE — ED Provider Notes (Signed)
[facility] EMERGENCY DEPARTMENT Provider Note   CSN: [id number] Arrival date & time: 10/10/22  1200     History  Chief Complaint  Patient presents with   Emesis    [name] is a 57 y.o. female.  With a history of asthma, hypertension, depression, COPD, TIA, GERD who presents to the ED for evaluation of nausea and vomiting.  She states the symptoms have been present for a few months, but worse since 2 days ago.  She has been unable to eat solid food at all during that time.  States she has also been unable to tolerate liquids.  Has epigastric burning pain and abdominal tenderness after episodes of retching.  She states she has seen her primary care provider and GI.  She has had an upper endoscopy and colonoscopy which were both unremarkable in terms of the cause of her symptoms.  She states she has also been losing weight rather quickly.  She has lost nearly 25 pounds since June.  She states she has tried Zofran in the past which did not help her symptoms at all.  She has been prescribed Phenergan.  She states that this does not help either and just makes her sleepy.  Since this morning, she has felt progressively weak.  She feels dizzy and lightheaded when she changes positions or walks around.  She denies fevers, chills, diarrhea, dysuria, frequency, urgency.  She has associated decrease in urine output.  Has a history of abdominal surgery including hysterectomy.  She is a pack-a-day smoker and has been doing this for many years.   Emesis      Home Medications Prior to Admission medications   Medication Sig Start Date End Date Taking? Authorizing Provider  metoCLOPramide (REGLAN) 10 MG tablet Take 1 tablet (10 mg total) by mouth every 6 (six) hours. 10/10/22  Yes Tylon Kemmerling, Grafton Folk, PA-C  acetaminophen (TYLENOL) 650 MG CR tablet Take 1,300 mg by mouth every 8 (eight) hours as needed for pain.    [provider]  albuterol (PROVENTIL HFA;VENTOLIN HFA) 108 (90 BASE) MCG/ACT  inhaler Inhale 2 puffs into the lungs every 6 (six) hours as needed for shortness of breath.     [provider]  ALPRAZolam Duanne Moron) 0.5 MG tablet Take 0.5 mg by mouth 3 (three) times daily. 04/13/21   [provider]  famotidine (PEPCID) 40 MG tablet Take 1 tablet (40 mg total) by mouth 2 (two) times daily. 05/02/22   Erenest Rasher, PA-C  nebivolol (BYSTOLIC) 10 MG tablet Take 10 mg by mouth daily.    [provider]  promethazine (PHENERGAN) 12.5 MG tablet Take 1-2 tablets by mouth every 6 hours as needed for nausea or vomiting. 07/17/22   Eloise Harman, DO  spironolactone (ALDACTONE) 25 MG tablet Take 1 tablet (25 mg total) by mouth daily. 02/05/22 01/31/23  Buford Dresser, MD  VRAYLAR 3 MG capsule Take 3 mg by mouth daily. 01/23/22   [provider]      Allergies    Bee venom, Ibuprofen, Codeine, Penicillins, Prilosec [omeprazole], and Zantac [ranitidine]    Review of Systems   Review of Systems  Constitutional:  Positive for appetite change and fatigue.  Gastrointestinal:  Positive for nausea and vomiting.  All other systems reviewed and are negative.   Physical Exam Updated Vital Signs BP 106/71   Pulse 90   Temp 98.5 F (36.9 C) (Oral)   Resp (!) 23   Ht '5\' 2"'$  (1.575 m)  Wt 54.6 kg   SpO2 91%   BMI 22.02 kg/m  Physical Exam Vitals and nursing note reviewed.  Constitutional:      General: She is not in acute distress.    Appearance: She is well-developed. She is not ill-appearing, toxic-appearing or diaphoretic.     Comments: Resting comfortably in bed.  Very thin build.  HENT:     Head: Normocephalic and atraumatic.  Eyes:     General: No scleral icterus.    Conjunctiva/sclera: Conjunctivae normal.  Cardiovascular:     Rate and Rhythm: Normal rate and regular rhythm.     Pulses: Normal pulses.     Heart sounds: No murmur heard. Pulmonary:     Effort: Pulmonary effort is normal. No respiratory distress.     Breath  sounds: Normal breath sounds. No stridor. No wheezing, rhonchi or rales.  Abdominal:     Palpations: Abdomen is soft.     Tenderness: There is abdominal tenderness (Generalized.  Mild.). There is no guarding or rebound. Negative signs include Murphy's sign, Rovsing's sign, McBurney's sign, psoas sign and obturator sign.  Musculoskeletal:        General: No swelling.     Cervical back: Neck supple.  Skin:    General: Skin is warm and dry.     Capillary Refill: Capillary refill takes less than 2 seconds.     Comments: Tenting present.  Neurological:     General: No focal deficit present.     Mental Status: She is alert and oriented to person, place, and time.  Psychiatric:        Mood and Affect: Mood normal.     ED Results / Procedures / Treatments   Labs (all labs ordered are listed, but only abnormal results are displayed) Labs Reviewed  CBC WITH DIFFERENTIAL/PLATELET - Abnormal; Notable for the following components:      Result Value   WBC 13.4 (*)    Hemoglobin 15.9 (*)    HCT 46.5 (*)    Neutro Abs 9.5 (*)    All other components within normal limits  COMPREHENSIVE METABOLIC PANEL - Abnormal; Notable for the following components:   Chloride 96 (*)    Glucose, Bld 108 (*)    Total Bilirubin 2.1 (*)    All other components within normal limits  URINALYSIS, ROUTINE W REFLEX MICROSCOPIC - Abnormal; Notable for the following components:   Ketones, ur 20 (*)    All other components within normal limits  LIPASE, BLOOD  MAGNESIUM    EKG EKG Interpretation  Date/Time:  Thursday October 10 2022 15:02:04 EST Ventricular Rate:  86 PR Interval:  146 QRS Duration: 92 QT Interval:  379 QTC Calculation: 454 R Axis:   81 Text Interpretation: Sinus rhythm Confirmed by Cindee Lame [phone]) on 10/10/2022 3:44:52 PM  Radiology No results found.  Procedures Procedures    Medications Ordered in ED Medications  alum & mag hydroxide-simeth (MAALOX/MYLANTA) 200-200-20 MG/5ML  suspension 30 mL (30 mLs Oral Given 10/10/22 1443)    And  lidocaine (XYLOCAINE) 2 % viscous mouth solution 15 mL (15 mLs Oral Given 10/10/22 1443)  sodium chloride 0.9 % bolus 1,000 mL (0 mLs Intravenous Stopped 10/10/22 1607)  metoCLOPramide (REGLAN) injection 10 mg (10 mg Intravenous Given 10/10/22 1604)    ED Course/ Medical Decision Making/ A&P Clinical Course as of 10/10/22 1818  Thu Oct 10, 2022  1629 On reevaluation, patient states that her nausea has resolved as well as her abdominal pain.  Will  attempt p.o. challenge. [AS]  5465 Patient did pass p.o. challenge.  States she has had some nausea after attempting to eat the peanut butter however this has passed [AS]    Clinical Course User Index [AS] Sharayah Renfrow, Grafton Folk, PA-C                             Medical Decision Making Amount and/or Complexity of Data Reviewed Labs: ordered.  Risk OTC drugs. Prescription drug management.  This patient presents to the ED for concern of nausea and vomiting, this involves an extensive number of treatment options, and is a complaint that carries with it a high risk of complications and morbidity. The emergent differential diagnosis for vomiting includes, but is not limited to ACS/MI, DKA, Ischemic bowel, Meningitis, Sepsis, Acute gastric dilation, Adrenal insufficiency, Appendicitis,  Bowel obstruction/ileus, Carbon monoxide poisoning, Cholecystitis, Electrolyte abnormalities, Elevated ICP, Gastric outlet obstruction, Pancreatitis, Ruptured viscus, Biliary colic, Cannabinoid hyperemesis syndrome, Gastritis, Gastroenteritis, Gastroparesis,  Narcotic withdrawal, Peptic ulcer disease, and UTI   Co morbidities that complicate the patient evaluation  asthma, hypertension, depression, COPD, TIA, GERD  My initial workup includes abdominal pain labs, magnesium, urinalysis, IV fluids, EKG, IV Reglan pending EKG  Additional history obtained from: Nursing notes from this visit. Previous records within  EMR system ED visit on 04/11/2022 for same symptoms  I ordered, reviewed and interpreted labs which include: CBC, CMP, urinalysis, lipase, magnesium.  Slight leukocytosis of 13.4.  Elevated hemoglobin of 15.9.  Elevated bilirubin at 2.1.  LFTs unremarkable.  Patient typically has an elevated bilirubin and hemoglobin  Afebrile, hemodynamically stable.  57 year old female presents to the ED for evaluation of nausea and vomiting that has been present for 2 days.  Overlying symptoms have been present for approximately 6 months.  She states she called her GI provider multiple times for the last 2 days but was unable to get through to anyone.  Patient appears very thin on exam, but has a benign abdominal exam.  Her lab workup was reassuring.  She did report significant improvement in her symptoms after GI cocktail and Reglan.  She passed her p.o. challenge in the ED.  She will be sent a prescription for Reglan and encouraged to follow-up with her GI provider.  She was given return precautions.  Stable at discharge.  At this time there does not appear to be any evidence of an acute emergency medical condition and the patient appears stable for discharge with appropriate outpatient follow up. Diagnosis was discussed with patient who verbalizes understanding of care plan and is agreeable to discharge. I have discussed return precautions with patient and significant other who verbalizes understanding. Patient encouraged to follow-up with their PCP within 1 week. All questions answered.  Patient's case discussed with Dr. Truett Mainland who agrees with plan to discharge with follow-up.   Note: Portions of this report may have been transcribed using voice recognition software. Every effort was made to ensure accuracy; however, inadvertent computerized transcription errors may still be present.        Final Clinical Impression(s) / ED Diagnoses Final diagnoses:  Nausea and vomiting, unspecified vomiting type    Rx  / DC Orders ED Discharge Orders          Ordered    metoCLOPramide (REGLAN) 10 MG tablet  Every 6 hours        10/10/22 1815              Rayjon Wery,  Grafton Folk, PA-C 10/10/22 1819    Cristie Hem, MD 10/11/22 1626

## 2022-10-10 NOTE — Discharge Instructions (Addendum)
You have been seen today for your complaint of nausea and vomiting. Your lab work was reassuring. Your discharge medications include Reglan.  This is a nausea medication.  Only take it as needed so that you may eat and drink normal diet. Home care instructions are as follows:  Start with foods that are easy to digest such as soups and progressed to a normal diet over the course of the next 2 days Follow up with: GI.  You should call them to schedule follow-up appointment Please seek immediate medical care if you develop any of the following symptoms: You have pain in your chest, neck, arm, or jaw. You feel extremely weak or you faint. You have persistent vomiting. You have vomit that is bright red or looks like black coffee grounds. You have bloody or black stools (feces) or stools that look like tar. You have a severe headache, a stiff neck, or both. You have severe pain, cramping, or bloating in your abdomen. You have difficulty breathing, or you are breathing very quickly. Your heart is beating very quickly. Your skin feels cold and clammy. You feel confused. You have signs of dehydration, such as: Dark urine, very little urine, or no urine. Cracked lips. Dry mouth. Sunken eyes. Sleepiness. Weakness. At this time there does not appear to be the presence of an emergent medical condition, however there is always the potential for conditions to change. Please read and follow the below instructions.  Do not take your medicine if  develop an itchy rash, swelling in your mouth or lips, or difficulty breathing; call 911 and seek immediate emergency medical attention if this occurs.  You may review your lab tests and imaging results in their entirety on your MyChart account.  Please discuss all results of fully with your primary care provider and other specialist at your follow-up visit.  Note: Portions of this text may have been transcribed using voice recognition software. Every effort  was made to ensure accuracy; however, inadvertent computerized transcription errors may still be present.

## 2022-10-11 ENCOUNTER — Encounter (HOSPITAL_BASED_OUTPATIENT_CLINIC_OR_DEPARTMENT_OTHER): Payer: Self-pay | Admitting: Cardiology

## 2022-10-11 ENCOUNTER — Ambulatory Visit (HOSPITAL_BASED_OUTPATIENT_CLINIC_OR_DEPARTMENT_OTHER): Payer: BC Managed Care – PPO | Admitting: Cardiology

## 2022-10-11 VITALS — BP 136/76 | HR 89 | Ht 62.0 in | Wt 121.6 lb

## 2022-10-11 DIAGNOSIS — R0989 Other specified symptoms and signs involving the circulatory and respiratory systems: Secondary | ICD-10-CM

## 2022-10-11 DIAGNOSIS — R002 Palpitations: Secondary | ICD-10-CM | POA: Diagnosis not present

## 2022-10-11 DIAGNOSIS — Z7189 Other specified counseling: Secondary | ICD-10-CM | POA: Diagnosis not present

## 2022-10-11 DIAGNOSIS — R634 Abnormal weight loss: Secondary | ICD-10-CM | POA: Diagnosis not present

## 2022-10-11 NOTE — Patient Instructions (Signed)
Medication Instructions:  Your Physician recommend you continue on your current medication as directed.    *If you need a refill on your cardiac medications before your next appointment, please call your pharmacy*  Follow-Up: At [facility], you and your health needs are our priority.  As part of our continuing mission to provide you with exceptional heart care, we have created designated Provider Care Teams.  These Care Teams include your primary Cardiologist (physician) and Advanced Practice Providers (APPs -  Physician Assistants and Nurse Practitioners) who all work together to provide you with the care you need, when you need it.  We recommend signing up for the patient portal called "MyChart".  Sign up information is provided on this After Visit Summary.  MyChart is used to connect with patients for Virtual Visits (Telemedicine).  Patients are able to view lab/test results, encounter notes, upcoming appointments, etc.  Non-urgent messages can be sent to your provider as well.   To learn more about what you can do with MyChart, go to [email or web address].    Your next appointment:   3-4 month(s)  Provider:   Buford Dresser, MD

## 2022-10-11 NOTE — Progress Notes (Signed)
Cardiology Office Note:    Date:  10/11/2022   ID:  [name], DOB 14-Feb-1966, MRN [id number]  PCP:  Celene Squibb, MD  Cardiologist:  Buford Dresser, MD PhD  Referring MD: Celene Squibb, MD   CC: follow up  History of Present Illness:    [name] is a 57 y.o. female with a hx of tobacco use, hypertension who is seen for follow up today. I initially met her 05/25/2019 as a new consult at the request of Celene Squibb, MD for the evaluation and management of dizziness, palpitations, left arm/chest tightness.  We reviewed the lab results as follows: "The catecholamine test is reassuring. Numbers were very normal and some low. This suggests against pheochromocytoma, though there is a small chance that we missed some spikes. We could consider a 24 hour urine test to make sure we aren't missing anything.   The aldosterone/renin test was mixed. The renin level was very low, suggesting that it is being suppressed, but the aldosterone was normal. Typically when we are thinking about hyperaldosteronism, the aldosterone will be much higher and the renin will be low. However, the edarbi can affect these numbers. Because of this, it is more likely that you DO have elevated aldosterone with these numbers while on edarbi."   Today: Continues to have uncontrolled nausea and vomiting intermittently. Had about 3 weeks where the nausea/vomiting slowed down. Unfortunately symptoms came back recently, went to ER yesterday. Got meds, IV fluids. Has appt with GI at the end of this month. Weight down to 117lbs from prior 164 lbs about 6-7 months ago. Has had upper endoscopy in 04/2022 and lower GI in 06/2022.  From a cardiac perspective she is stable. She has had some pre-syncope when feeling really ill, like yesterday, but no full syncope.   Denies chest pain, shortness of breath at rest or with normal exertion. No PND, orthopnea, LE edema. No syncope but has had presyncope.   Past Medical History:   Diagnosis Date   Asthma    COPD (chronic obstructive pulmonary disease) ([place])    Depression    Hypertension    PONV (postoperative nausea and vomiting)    Seasonal allergies    TIA (transient ischemic attack) 01/2022    Past Surgical History:  Procedure Laterality Date   ABDOMINAL HYSTERECTOMY     BIOPSY  05/06/2022   Procedure: BIOPSY;  Surgeon: Eloise Harman, DO;  [facility];  Service: Endoscopy;;   CHOLECYSTECTOMY     COLONOSCOPY  05/21/2011   Procedure: COLONOSCOPY;  Surgeon: Rogene Houston, MD;  [facility];  Service: Endoscopy;  Laterality: N/A;  12:30   COLONOSCOPY WITH PROPOFOL N/A 07/23/2022   Procedure: COLONOSCOPY WITH PROPOFOL;  Surgeon: Eloise Harman, DO;  [facility]: [facility];  Service: Endoscopy;  Laterality: N/A;  2:00pm, asa 2, pt knows to arrive at 11:30   ESOPHAGEAL DILATION  05/06/2022   Procedure: ESOPHAGEAL DILATION;  Surgeon: Eloise Harman, DO;  [facility];  Service: Endoscopy;;   ESOPHAGOGASTRODUODENOSCOPY     ESOPHAGOGASTRODUODENOSCOPY (EGD) WITH PROPOFOL N/A 05/06/2022   Procedure: ESOPHAGOGASTRODUODENOSCOPY (EGD) WITH PROPOFOL;  Surgeon: Eloise Harman, DO;  [facility];  Service: Endoscopy;  Laterality: N/A;  8:00AM   POLYPECTOMY  07/23/2022   Procedure: POLYPECTOMY;  Surgeon: Eloise Harman, DO;  [facility];  Service: Endoscopy;;   TUBAL LIGATION      Current Medications: Current Outpatient Medications  on File Prior to Visit  Medication Sig   acetaminophen (TYLENOL) 650 MG CR tablet Take 1,300 mg by mouth every 8 (eight) hours as needed for pain.   albuterol (PROVENTIL HFA;VENTOLIN HFA) 108 (90 BASE) MCG/ACT inhaler Inhale 2 puffs into the lungs every 6 (six) hours as needed for shortness of breath.    ALPRAZolam (XANAX) 0.5 MG tablet Take 0.5 mg by mouth 3 (three) times daily.   famotidine (PEPCID) 40 MG tablet Take 1 tablet (40 mg total) by mouth 2 (two) times daily.    metoCLOPramide (REGLAN) 10 MG tablet Take 1 tablet (10 mg total) by mouth every 6 (six) hours.   nebivolol (BYSTOLIC) 10 MG tablet Take 10 mg by mouth daily.   promethazine (PHENERGAN) 12.5 MG tablet Take 1-2 tablets by mouth every 6 hours as needed for nausea or vomiting.   spironolactone (ALDACTONE) 25 MG tablet Take 1 tablet (25 mg total) by mouth daily.   VRAYLAR 3 MG capsule Take 3 mg by mouth daily.   No current facility-administered medications on file prior to visit.     Allergies:   Bee venom, Ibuprofen, Codeine, Penicillins, Prilosec [omeprazole], and Zantac [ranitidine]   Social History   Tobacco Use   Smoking status: Every Day    Packs/day: 0.50    Years: 23.00    Total pack years: 11.50    Types: Cigarettes    Passive exposure: Current   Smokeless tobacco: Never  Vaping Use   Vaping Use: Every day  Substance Use Topics   Alcohol use: No   Drug use: No      Family History: The patient's family history includes Colon cancer in an other family member; Hyperlipidemia in her mother; Hypertension in her mother; Kidney cancer in her father; Stroke in her mother. mother had a stroke at age 82, 59 had CHF, [name] had heart and blood pressure problems. Everyone else has cancer.  ROS:   Please see the history of present illness.   Additional pertinent ROS otherwise unremarkable.   EKGs/Labs/Other Studies Reviewed:    The following studies were reviewed today:  Echo 01/02/22  1. Left ventricular ejection fraction, by estimation, is 50 to 55%. The  left ventricle has low normal function. The left ventricle has no regional  wall motion abnormalities. Left ventricular diastolic parameters were  normal.   2. Right ventricular systolic function is normal. The right ventricular  size is normal. There is normal pulmonary artery systolic pressure.   3. The mitral valve is normal in structure. No evidence of mitral valve  regurgitation. No evidence of mitral stenosis.    4. The aortic valve is tricuspid. Aortic valve regurgitation is not  visualized. No aortic stenosis is present.   5. The inferior vena cava is normal in size with greater than 50%  respiratory variability, suggesting right atrial pressure of 3 mmHg.   Comparison(s): No prior Echocardiogram.   CTA Head/Neck 01/01/22 IMPRESSION: Patent vasculature of the head and neck with no significant stenosis or occlusion.  CT Head 01/01/22 IMPRESSION: 1. No acute intracranial pathology. 2. ASPECTS is 10 3. Empty sella. This finding is nonspecific and could reflect a normal variant common but can also be seen in the setting of intracranial hypertension.  Blood Pressure Monitor 07/09/21 24 ambulatory summary: Mean BP 132/77, HR 81 Awake average 140/84, HR 82 Asleep average 111/62, HR 78   Dip with sleep: systolic 99991111, diastolic 123456   Max systolic: A999333 Max diastolic: A999333  Normal BP variability, average BP near goal.  CT coronary 06/16/2019 FINDINGS: Coronary calcium score: The patient's coronary artery calcium score is 0, which places the patient in the 0 percentile.   Coronary arteries: Normal coronary origins.  Right dominance.   Right Coronary Artery: Medium caliber vessel that give rise to PLB and PDA. No significant plaque or stenosis.   Left Main Coronary Artery: No significant plaque or stenosis.   Left Anterior Descending Coronary Artery: Gives rise to two medium sized diagonals. No significant plaque or stenosis in LAD or branch vessels.   Left Circumflex Artery: Gives rise to two large OM branches. No significant plaque or stenosis in LCx or branch vessels.   Aorta: Normal size, 27 mm at the mid ascending aorta (level of the PA bifurcation) measured double oblique. No calcifications. No dissection.   Aortic Valve: No calcifications. Trileaflet.   Other findings:  Normal pulmonary vein drainage into the left atrium.  Normal left atrial appendage without a  thrombus.  Normal size of the pulmonary artery.   IMPRESSION: 1. No evidence of CAD, CADRADS = 0.  2. Coronary calcium score of 0. This was 0 percentile for age and sex matched control.  3. Normal coronary origin with right dominance.  Event monitor 08/30/2019 12 days of data recorded on Zio monitor. Patient had a min HR of 58 bpm, max HR of 184 bpm, and avg HR of 82 bpm. Predominant underlying rhythm was Sinus Rhythm. No VT, atrial fibrillation, high degree block, or pauses noted. Isolated atrial and ventricular ectopy was rare (<1%). There were 19 triggered events. There were 6 SVT events noted, all very brief (longest 5 beats). These were detected near patient triggered event markers.  Carotid u/s 06/07/2019 RIGHT CAROTID ARTERY: No significant calcifications of the right common carotid artery. Intermediate waveform maintained. Heterogeneous and partially calcified plaque at the right carotid bifurcation. No significant lumen shadowing. Low resistance waveform of the right ICA. No significant tortuosity.   RIGHT VERTEBRAL ARTERY: Antegrade flow with low resistance waveform.   LEFT CAROTID ARTERY: No significant calcifications of the left common carotid artery. Intermediate waveform maintained. Heterogeneous and partially calcified plaque at the left carotid bifurcation without significant lumen shadowing. Low resistance waveform of the left ICA. No significant tortuosity.   LEFT VERTEBRAL ARTERY:  Antegrade flow with low resistance waveform.   IMPRESSION: Color duplex indicates minimal heterogeneous and calcified plaque, with no hemodynamically significant stenosis by duplex criteria in the extracranial cerebrovascular circulation.  EKG:  EKG was personally reviewed today 10/02/22: not ordered 09/06/2022: EKG was not ordered.  04/15/2022: EKG was not ordered. 01/10/2022: not ordered 03/07/21 NSR 81 bpm  Recent Labs: 01/01/2022: TSH 0.911 10/10/2022: ALT 22; BUN 15; Creatinine,  Ser 0.86; Hemoglobin 15.9; Magnesium 2.2; Platelets 210; Potassium 4.0; Sodium 135   Recent Lipid Panel    Component Value Date/Time   CHOL 144 01/02/2022 0447   TRIG 86 01/02/2022 0447   HDL 48 01/02/2022 0447   CHOLHDL 3.0 01/02/2022 0447   VLDL 17 01/02/2022 0447   LDLCALC 79 01/02/2022 0447    Physical Exam:    VS:  BP 136/76 (BP Location: Left Arm, Patient Position: Sitting, Cuff Size: Normal)   Pulse 89   Ht 5' 2"$  (1.575 m)   Wt 121 lb 9.6 oz (55.2 kg)   SpO2 93%   BMI 22.24 kg/m     Wt Readings from Last 3 Encounters:  10/11/22 121 lb 9.6 oz (55.2 kg)  10/10/22 120 lb 6.4  oz (54.6 kg)  09/06/22 124 lb 4.8 oz (56.4 kg)    GEN: Well nourished, well developed in no acute distress HEENT: Normal, moist mucous membranes NECK: No JVD CARDIAC: regular rhythm, normal S1 and S2, no rubs or gallops. No murmur. VASCULAR: Radial and DP pulses 2+ bilaterally. No carotid bruits RESPIRATORY:  Largely clear, mild wheezing bilaterally ABDOMEN: Soft, non-tender, non-distended MUSCULOSKELETAL:  Ambulates independently SKIN: Warm and dry, no edema NEUROLOGIC:  Alert and oriented x 3. No focal neuro deficits noted. PSYCHIATRIC:  Normal affect    ASSESSMENT:    1. Unintentional weight loss   2. Labile hypertension   3. Palpitation   4. Counseling on health promotion and disease prevention     PLAN:    Unintentional weight loss vomiting -unclear etiology of this, being evaluated by GI -poor oral nutrition may also be affecting her intermittent hypotension  Transient neuro symptoms with elevated BP Labile hypertension Concern for orthostatic hypotension, with orthostatic lightheadedness -BP low with initiation of spironolactone (may be exacerbated by poor PO intake, as above) -on spironolactone and nebivolol -she is not currently taking azilsartan, ok to remain off of this  Palpitations:  -managed on bystolic current dose -see monitor as above  Tobacco use counseling:  working to cut back  CV prevention: -calcium score 0, no CAD on CT 2020 -she has stopped aspirin, atorvastatin. Ok to remain off of these, can reassess in the future  Cardiac risk counseling and prevention recommendations: -recommend heart healthy/Mediterranean diet, with whole grains, fruits, vegetable, fish, lean meats, nuts, and olive oil. Limit salt. -recommend moderate walking, 3-5 times/week for 30-50 minutes each session. Aim for at least 150 minutes.week. Goal should be pace of 3 miles/hours, or walking 1.5 miles in 30 minutes -recommend avoidance of tobacco products. Avoid excess alcohol.  Plan for follow up: 3-4 mos or sooner as needed.  Buford Dresser, MD, PhD, [facility]    Medication Adjustments/Labs and Tests Ordered: Current medicines are reviewed at length with the patient today.  Concerns regarding medicines are outlined above.   No orders of the defined types were placed in this encounter.  No orders of the defined types were placed in this encounter.  Patient Instructions  Medication Instructions:  Your Physician recommend you continue on your current medication as directed.    *If you need a refill on your cardiac medications before your next appointment, please call your pharmacy*  Follow-Up: At [facility], you and your health needs are our priority.  As part of our continuing mission to provide you with exceptional heart care, we have created designated Provider Care Teams.  These Care Teams include your primary Cardiologist (physician) and Advanced Practice Providers (APPs -  Physician Assistants and Nurse Practitioners) who all work together to provide you with the care you need, when you need it.  We recommend signing up for the patient portal called "MyChart".  Sign up information is provided on this After Visit Summary.  MyChart is used to connect with patients for Virtual Visits (Telemedicine).  Patients are able to  view lab/test results, encounter notes, upcoming appointments, etc.  Non-urgent messages can be sent to your provider as well.   To learn more about what you can do with MyChart, go to [email or web address].    Your next appointment:   3-4 month(s)  Provider:   Buford Dresser, MD      Signed, Buford Dresser, MD PhD 10/11/2022     [facility] 

## 2022-10-13 ENCOUNTER — Encounter (HOSPITAL_COMMUNITY): Payer: Self-pay

## 2022-10-13 ENCOUNTER — Other Ambulatory Visit: Payer: Self-pay

## 2022-10-13 ENCOUNTER — Inpatient Hospital Stay (HOSPITAL_COMMUNITY)
Admission: EM | Admit: 2022-10-13 | Discharge: 2022-10-17 | DRG: 392 | Disposition: A | Discharge status: Home / Self Care | Payer: BC Managed Care – PPO | Attending: Internal Medicine | Admitting: Internal Medicine

## 2022-10-13 DIAGNOSIS — J302 Other seasonal allergic rhinitis: Secondary | ICD-10-CM | POA: Diagnosis present

## 2022-10-13 DIAGNOSIS — F17200 Nicotine dependence, unspecified, uncomplicated: Secondary | ICD-10-CM | POA: Diagnosis present

## 2022-10-13 DIAGNOSIS — K449 Diaphragmatic hernia without obstruction or gangrene: Secondary | ICD-10-CM | POA: Diagnosis not present

## 2022-10-13 DIAGNOSIS — J4489 Other specified chronic obstructive pulmonary disease: Secondary | ICD-10-CM | POA: Diagnosis present

## 2022-10-13 DIAGNOSIS — R112 Nausea with vomiting, unspecified: Secondary | ICD-10-CM | POA: Diagnosis not present

## 2022-10-13 DIAGNOSIS — Z91030 Bee allergy status: Secondary | ICD-10-CM

## 2022-10-13 DIAGNOSIS — T43595A Adverse effect of other antipsychotics and neuroleptics, initial encounter: Secondary | ICD-10-CM | POA: Diagnosis present

## 2022-10-13 DIAGNOSIS — Z8673 Personal history of transient ischemic attack (TIA), and cerebral infarction without residual deficits: Secondary | ICD-10-CM | POA: Diagnosis not present

## 2022-10-13 DIAGNOSIS — K219 Gastro-esophageal reflux disease without esophagitis: Secondary | ICD-10-CM | POA: Diagnosis present

## 2022-10-13 DIAGNOSIS — E876 Hypokalemia: Secondary | ICD-10-CM | POA: Diagnosis not present

## 2022-10-13 DIAGNOSIS — Z83438 Family history of other disorder of lipoprotein metabolism and other lipidemia: Secondary | ICD-10-CM

## 2022-10-13 DIAGNOSIS — Z885 Allergy status to narcotic agent status: Secondary | ICD-10-CM | POA: Diagnosis not present

## 2022-10-13 DIAGNOSIS — Z79899 Other long term (current) drug therapy: Secondary | ICD-10-CM

## 2022-10-13 DIAGNOSIS — K2270 Barrett's esophagus without dysplasia: Secondary | ICD-10-CM | POA: Diagnosis not present

## 2022-10-13 DIAGNOSIS — Z823 Family history of stroke: Secondary | ICD-10-CM | POA: Diagnosis not present

## 2022-10-13 DIAGNOSIS — F17210 Nicotine dependence, cigarettes, uncomplicated: Secondary | ICD-10-CM | POA: Diagnosis present

## 2022-10-13 DIAGNOSIS — F319 Bipolar disorder, unspecified: Secondary | ICD-10-CM | POA: Diagnosis not present

## 2022-10-13 DIAGNOSIS — E860 Dehydration: Secondary | ICD-10-CM | POA: Diagnosis present

## 2022-10-13 DIAGNOSIS — Z8249 Family history of ischemic heart disease and other diseases of the circulatory system: Secondary | ICD-10-CM

## 2022-10-13 DIAGNOSIS — Z716 Tobacco abuse counseling: Secondary | ICD-10-CM

## 2022-10-13 DIAGNOSIS — Z880 Allergy status to penicillin: Secondary | ICD-10-CM | POA: Diagnosis not present

## 2022-10-13 DIAGNOSIS — I10 Essential (primary) hypertension: Secondary | ICD-10-CM | POA: Diagnosis not present

## 2022-10-13 DIAGNOSIS — F32A Depression, unspecified: Secondary | ICD-10-CM

## 2022-10-13 DIAGNOSIS — N3000 Acute cystitis without hematuria: Secondary | ICD-10-CM | POA: Diagnosis not present

## 2022-10-13 LAB — BASIC METABOLIC PANEL
Anion gap: 7 (ref 5–15)
BUN: 9 mg/dL (ref 6–20)
CO2: 24 mmol/L (ref 22–32)
Calcium: 8.2 mg/dL — ABNORMAL LOW (ref 8.9–10.3)
Chloride: 109 mmol/L (ref 98–111)
Creatinine, Ser: 0.75 mg/dL (ref 0.44–1.00)
GFR, Estimated: >60 mL/min (ref >60)
Glucose, Bld: 79 mg/dL (ref 70–99)
Potassium: 4 mmol/L (ref 3.5–5.1)
Sodium: 140 mmol/L (ref 135–145)

## 2022-10-13 LAB — URINALYSIS, ROUTINE W REFLEX MICROSCOPIC
Bilirubin Urine: NEGATIVE
Glucose, UA: NEGATIVE mg/dL
Hgb urine dipstick: NEGATIVE
Ketones, ur: 20 mg/dL — AB
Nitrite: NEGATIVE
Protein, ur: NEGATIVE mg/dL
Specific Gravity, Urine: 1.008 (ref 1.005–1.030)
pH: 6 (ref 5.0–8.0)

## 2022-10-13 LAB — COMPREHENSIVE METABOLIC PANEL
ALT: 12 U/L (ref 0–44)
AST: 12 U/L — ABNORMAL LOW (ref 15–41)
Albumin: 3 g/dL — ABNORMAL LOW (ref 3.5–5.0)
Alkaline Phosphatase: 33 U/L — ABNORMAL LOW (ref 38–126)
Anion gap: 7 (ref 5–15)
BUN: 9 mg/dL (ref 6–20)
CO2: 21 mmol/L — ABNORMAL LOW (ref 22–32)
Calcium: 6.8 mg/dL — ABNORMAL LOW (ref 8.9–10.3)
Chloride: 112 mmol/L — ABNORMAL HIGH (ref 98–111)
Creatinine, Ser: 0.53 mg/dL (ref 0.44–1.00)
GFR, Estimated: >60 mL/min (ref >60)
Glucose, Bld: 75 mg/dL (ref 70–99)
Potassium: 2.6 mmol/L — CL (ref 3.5–5.1)
Sodium: 140 mmol/L (ref 135–145)
Total Bilirubin: 0.6 mg/dL (ref 0.3–1.2)
Total Protein: 5.2 g/dL — ABNORMAL LOW (ref 6.5–8.1)

## 2022-10-13 LAB — CBC WITH DIFFERENTIAL/PLATELET
Abs Immature Granulocytes: 0.05 10*3/uL (ref 0.00–0.07)
Basophils Absolute: 0.1 10*3/uL (ref 0.0–0.1)
Basophils Relative: 1 %
Eosinophils Absolute: 0.2 10*3/uL (ref 0.0–0.5)
Eosinophils Relative: 1 %
HCT: 47.1 % — ABNORMAL HIGH (ref 36.0–46.0)
Hemoglobin: 16.4 g/dL — ABNORMAL HIGH (ref 12.0–15.0)
Immature Granulocytes: 0 %
Lymphocytes Relative: 24 %
Lymphs Abs: 3.1 10*3/uL (ref 0.7–4.0)
MCH: 33.1 pg (ref 26.0–34.0)
MCHC: 34.8 g/dL (ref 30.0–36.0)
MCV: 95.2 fL (ref 80.0–100.0)
Monocytes Absolute: 0.4 10*3/uL (ref 0.1–1.0)
Monocytes Relative: 3 %
Neutro Abs: 9.3 10*3/uL — ABNORMAL HIGH (ref 1.7–7.7)
Neutrophils Relative %: 71 %
Platelets: 212 10*3/uL (ref 150–400)
RBC: 4.95 MIL/uL (ref 3.87–5.11)
RDW: 13.2 % (ref 11.5–15.5)
WBC: 13.1 10*3/uL — ABNORMAL HIGH (ref 4.0–10.5)
nRBC: 0 % (ref 0.0–0.2)

## 2022-10-13 LAB — CBG MONITORING, ED: Glucose-Capillary: 72 mg/dL (ref 70–99)

## 2022-10-13 LAB — MAGNESIUM: Magnesium: 2.2 mg/dL (ref 1.7–2.4)

## 2022-10-13 LAB — LIPASE, BLOOD: Lipase: 32 U/L (ref 11–51)

## 2022-10-13 MED ORDER — ALBUTEROL SULFATE HFA 108 (90 BASE) MCG/ACT IN AERS: 2.0000 | INHALATION_SPRAY | Freq: Four times a day (QID) | RESPIRATORY_TRACT | Status: DC | PRN

## 2022-10-13 MED ORDER — ENOXAPARIN SODIUM 40 MG/0.4ML IJ SOSY
40.0000 mg | PREFILLED_SYRINGE | Freq: Every day | INTRAMUSCULAR | Status: DC
  Administered 2022-10-13 – 2022-10-16 (×4): 40 mg via SUBCUTANEOUS
  Filled 2022-10-13 (×4): qty 0.4

## 2022-10-13 MED ORDER — METOCLOPRAMIDE HCL 10 MG PO TABS: 10.0000 mg | ORAL_TABLET | Freq: Once | ORAL | Status: DC

## 2022-10-13 MED ORDER — POLYETHYLENE GLYCOL 3350 17 G PO PACK: 17.0000 g | PACK | Freq: Every day | ORAL | Status: DC | PRN

## 2022-10-13 MED ORDER — ALPRAZOLAM 0.5 MG PO TABS
0.5000 mg | ORAL_TABLET | Freq: Two times a day (BID) | ORAL | Status: DC
  Administered 2022-10-13 – 2022-10-17 (×8): 0.5 mg via ORAL
  Filled 2022-10-13 (×8): qty 1

## 2022-10-13 MED ORDER — SODIUM CHLORIDE 0.9 % IV BOLUS
1000.0000 mL | Freq: Once | INTRAVENOUS | Status: AC
  Administered 2022-10-13: 1000 mL via INTRAVENOUS

## 2022-10-13 MED ORDER — POTASSIUM CHLORIDE CRYS ER 20 MEQ PO TBCR
40.0000 meq | EXTENDED_RELEASE_TABLET | Freq: Once | ORAL | Status: AC
  Administered 2022-10-13: 40 meq via ORAL
  Filled 2022-10-13: qty 2

## 2022-10-13 MED ORDER — NEBIVOLOL HCL 10 MG PO TABS
10.0000 mg | ORAL_TABLET | Freq: Every day | ORAL | Status: DC
  Administered 2022-10-13 – 2022-10-16 (×4): 10 mg via ORAL
  Filled 2022-10-13 (×4): qty 1

## 2022-10-13 MED ORDER — ONDANSETRON HCL 4 MG/2ML IJ SOLN
4.0000 mg | Freq: Four times a day (QID) | INTRAMUSCULAR | Status: DC | PRN
  Administered 2022-10-13 – 2022-10-17 (×7): 4 mg via INTRAVENOUS
  Filled 2022-10-13 (×7): qty 2

## 2022-10-13 MED ORDER — ACETAMINOPHEN 325 MG PO TABS
650.0000 mg | ORAL_TABLET | Freq: Four times a day (QID) | ORAL | Status: DC | PRN
  Administered 2022-10-14: 650 mg via ORAL
  Filled 2022-10-13: qty 2

## 2022-10-13 MED ORDER — POTASSIUM CHLORIDE 10 MEQ/100ML IV SOLN
10.0000 meq | INTRAVENOUS | Status: AC
  Administered 2022-10-13 (×2): 10 meq via INTRAVENOUS
  Filled 2022-10-13 (×2): qty 100

## 2022-10-13 MED ORDER — FAMOTIDINE IN NACL 20-0.9 MG/50ML-% IV SOLN
20.0000 mg | Freq: Once | INTRAVENOUS | Status: AC
  Administered 2022-10-13: 20 mg via INTRAVENOUS
  Filled 2022-10-13: qty 50

## 2022-10-13 MED ORDER — CALCIUM GLUCONATE-NACL 1-0.675 GM/50ML-% IV SOLN
1.0000 g | Freq: Once | INTRAVENOUS | Status: AC
  Administered 2022-10-13: 1000 mg via INTRAVENOUS
  Filled 2022-10-13: qty 50

## 2022-10-13 MED ORDER — ACETAMINOPHEN 650 MG RE SUPP: 650.0000 mg | Freq: Four times a day (QID) | RECTAL | Status: DC | PRN

## 2022-10-13 MED ORDER — METOCLOPRAMIDE HCL 5 MG/ML IJ SOLN
10.0000 mg | Freq: Once | INTRAMUSCULAR | Status: AC
  Administered 2022-10-13: 10 mg via INTRAVENOUS
  Filled 2022-10-13: qty 2

## 2022-10-13 MED ORDER — TRAZODONE HCL 50 MG PO TABS
50.0000 mg | ORAL_TABLET | Freq: Every evening | ORAL | Status: DC | PRN
  Administered 2022-10-13 – 2022-10-16 (×4): 50 mg via ORAL
  Filled 2022-10-13 (×4): qty 1

## 2022-10-13 MED ORDER — ALBUTEROL SULFATE (2.5 MG/3ML) 0.083% IN NEBU: 2.5000 mg | INHALATION_SOLUTION | Freq: Four times a day (QID) | RESPIRATORY_TRACT | Status: DC | PRN

## 2022-10-13 MED ORDER — ONDANSETRON HCL 4 MG PO TABS
4.0000 mg | ORAL_TABLET | Freq: Four times a day (QID) | ORAL | Status: DC | PRN
  Administered 2022-10-16: 4 mg via ORAL
  Filled 2022-10-13: qty 1

## 2022-10-13 MED ORDER — POTASSIUM CHLORIDE 10 MEQ/100ML IV SOLN
10.0000 meq | Freq: Once | INTRAVENOUS | Status: AC
  Administered 2022-10-13: 10 meq via INTRAVENOUS
  Filled 2022-10-13: qty 100

## 2022-10-13 MED ORDER — CARIPRAZINE HCL 1.5 MG PO CAPS
3.0000 mg | ORAL_CAPSULE | Freq: Every day | ORAL | Status: DC
  Administered 2022-10-14 – 2022-10-17 (×4): 3 mg via ORAL
  Filled 2022-10-13: qty 1
  Filled 2022-10-13: qty 2
  Filled 2022-10-13: qty 1
  Filled 2022-10-13: qty 2
  Filled 2022-10-13: qty 1
  Filled 2022-10-13: qty 2

## 2022-10-13 MED ORDER — FLUTICASONE PROPIONATE 50 MCG/ACT NA SUSP
1.0000 | Freq: Every day | NASAL | Status: DC
  Administered 2022-10-14: 1 via NASAL
  Administered 2022-10-15: 2 via NASAL
  Administered 2022-10-16 – 2022-10-17 (×2): 1 via NASAL
  Filled 2022-10-13: qty 16

## 2022-10-13 MED ORDER — KCL IN DEXTROSE-NACL 40-5-0.9 MEQ/L-%-% IV SOLN
INTRAVENOUS | Status: AC
  Filled 2022-10-13 (×2): qty 1000

## 2022-10-13 MED ORDER — SODIUM CHLORIDE 0.9 % IV SOLN
12.5000 mg | Freq: Once | INTRAVENOUS | Status: AC
  Administered 2022-10-13: 12.5 mg via INTRAVENOUS
  Filled 2022-10-13: qty 0.5

## 2022-10-13 MED ORDER — SPIRONOLACTONE 25 MG PO TABS
25.0000 mg | ORAL_TABLET | Freq: Every day | ORAL | Status: DC
  Administered 2022-10-13: 25 mg via ORAL
  Filled 2022-10-13: qty 1

## 2022-10-13 NOTE — Assessment & Plan Note (Addendum)
Hypokalemia 2.6, Normal magnesium-2.2.  Hypokalemia likely secondary to acute and chronic vomiting.  Denies marijuana use.  No abdominal symptoms.  Afebrile.  WBC 13.1.  UA with positive leukocytes, but she denies urinary symptoms.  Also with hypocalcemia of 6.8, which was normal just 3 days ago. - Replete Lytes - D5/N/s + 40 kcl 100cc/hr x 20hrs - Clear liquid diet, advance diet as tolerated -UDS

## 2022-10-13 NOTE — ED Triage Notes (Signed)
Pt reports vomiting since this past Tuesday, hx of same chronic vomiting for almost a year and the GI doctor can not find anything wrong with her.

## 2022-10-13 NOTE — ED Notes (Signed)
Patient drinking sips of water, c/o nausea when drinking . Will notify EDPA

## 2022-10-13 NOTE — Assessment & Plan Note (Signed)
Resume Vraylar

## 2022-10-13 NOTE — ED Provider Notes (Signed)
[name]   CSN: [id number] Arrival date & time: 10/13/22  1316     History  Chief Complaint  Patient presents with   Vomiting    [name] is a 57 y.o. female with history significant for hypertension, COPD, tobacco use, GERD presents to the ED complaining of vomiting and syncopal episodes.  [name] states that [name] has had episodes of vomiting since this past Tuesday, was evaluated in the ED and was given fluids and Reglan.  Patient states that [name] was able to keep some things down until yesterday.  [name] is also unable to keep down liquids including Pedialyte.  Patient is followed by GI and has had an unremarkable workup so far with no explanation of symptoms.  [name] states that [name] has lost approximately 40 pounds in the last 6 months.  Patient began feeling lightheaded and was having syncopal episodes per her husband while awaiting ED evaluation.  Denies fever, chills, abdominal distention, melena, hematochezia, diarrhea, dysuria.       Home Medications Prior to Admission medications   Medication Sig Start Date End Date Taking? Authorizing Provider  acetaminophen (TYLENOL) 650 MG CR tablet Take 1,300 mg by mouth every 8 (eight) hours as needed for pain.   Yes [provider]  albuterol (PROVENTIL HFA;VENTOLIN HFA) 108 (90 BASE) MCG/ACT inhaler Inhale 2 puffs into the lungs every 6 (six) hours as needed for shortness of breath.    Yes [provider]  ALPRAZolam Duanne Moron) 0.5 MG tablet Take 0.5 mg by mouth 2 (two) times daily. 04/13/21  Yes [provider]  famotidine (PEPCID) 40 MG tablet Take 1 tablet (40 mg total) by mouth 2 (two) times daily. 05/02/22  Yes Jodi Mourning, Kristen S, PA-C  fluticasone (FLONASE) 50 MCG/ACT nasal spray Place 1-2 sprays into both nostrils daily.   Yes [provider]  metoCLOPramide (REGLAN) 10 MG tablet Take 1 tablet (10 mg total) by mouth every 6 (six) hours. 10/10/22   Yes Schutt, Grafton Folk, PA-C  nebivolol (BYSTOLIC) 10 MG tablet Take 10 mg by mouth at bedtime.   Yes [provider]  spironolactone (ALDACTONE) 25 MG tablet Take 1 tablet (25 mg total) by mouth daily. Patient taking differently: Take 25 mg by mouth at bedtime. 02/05/22 01/31/23 Yes Buford Dresser, MD  VRAYLAR 3 MG capsule Take 3 mg by mouth daily. 01/23/22  Yes [provider]  promethazine (PHENERGAN) 12.5 MG tablet Take 1-2 tablets by mouth every 6 hours as needed for nausea or vomiting. Patient not taking: Reported on 10/13/2022 07/17/22   Eloise Harman, DO      Allergies    Bee venom, Motrin [ibuprofen], Codeine, Penicillins, Prilosec [omeprazole], and Zantac [ranitidine]    Review of Systems   Review of Systems  Constitutional:  Positive for unexpected weight change. Negative for chills and fever.  Gastrointestinal:  Positive for abdominal pain, nausea and vomiting. Negative for abdominal distention, blood in stool and diarrhea.  Neurological:  Positive for syncope, weakness and light-headedness.    Physical Exam Updated Vital Signs BP 133/75 (BP Location: Left Arm)   Pulse 85   Temp 97.9 F (36.6 C) (Oral)   Resp 19   Ht '5\' 2"'$  (1.575 m)   Wt 54.4 kg   SpO2 96%   BMI 21.95 kg/m  Physical Exam Vitals and nursing Paul reviewed.  Constitutional:      General: [name] is not in acute distress.    Appearance:  [name] is underweight. [name] is ill-appearing. [name] is not toxic-appearing or diaphoretic.  HENT:     Mouth/Throat:     Lips: Pink.     Mouth: Mucous membranes are moist.     Pharynx: Oropharynx is clear.  Cardiovascular:     Rate and Rhythm: Normal rate and regular rhythm.     Pulses: Normal pulses.     Heart sounds: Normal heart sounds.  Pulmonary:     Effort: Pulmonary effort is normal. No tachypnea or respiratory distress.     Breath sounds: Normal breath sounds and air entry.  Abdominal:     General: Abdomen is flat. Bowel sounds are  normal. There is no distension.     Palpations: Abdomen is soft.     Tenderness: There is abdominal tenderness in the epigastric area.  Skin:    General: Skin is warm and dry.     Capillary Refill: Capillary refill takes less than 2 seconds.     Coloration: Skin is pale.  Neurological:     Mental Status: [name] is alert. Mental status is at baseline.  Psychiatric:        Mood and Affect: Mood normal.        Behavior: Behavior normal.     ED Results / Procedures / Treatments   Labs (all labs ordered are listed, but only abnormal results are displayed) Labs Reviewed  COMPREHENSIVE METABOLIC PANEL - Abnormal; Notable for the following components:      Result Value   Potassium 2.6 (*)    Chloride 112 (*)    CO2 21 (*)    Calcium 6.8 (*)    Total Protein 5.2 (*)    Albumin 3.0 (*)    AST 12 (*)    Alkaline Phosphatase 33 (*)    All other components within normal limits  URINALYSIS, ROUTINE W REFLEX MICROSCOPIC - Abnormal; Notable for the following components:   Color, Urine STRAW (*)    Ketones, ur 20 (*)    Leukocytes,Ua LARGE (*)    Bacteria, UA RARE (*)    All other components within normal limits  CBC WITH DIFFERENTIAL/PLATELET - Abnormal; Notable for the following components:   WBC 13.1 (*)    Hemoglobin 16.4 (*)    HCT 47.1 (*)    Neutro Abs 9.3 (*)    All other components within normal limits  URINE CULTURE  LIPASE, BLOOD  MAGNESIUM  BASIC METABOLIC PANEL  CBG MONITORING, ED    EKG EKG Interpretation  Date/Time:  Sunday October 13 2022 15:43:51 EST Ventricular Rate:  77 PR Interval:  157 QRS Duration: 96 QT Interval:  383 QTC Calculation: 434 R Axis:   76 Text Interpretation: Sinus rhythm Ventricular premature complex Confirmed by Godfrey Pick ([id number]) on 10/13/2022 4:35:59 PM  Radiology No results found.  Procedures Procedures    Medications Ordered in ED Medications  calcium gluconate 1 g/ 50 mL sodium chloride IVPB (has no administration in time  range)  sodium chloride 0.9 % bolus 1,000 mL (1,000 mLs Intravenous New Bag/Given 10/13/22 1600)  promethazine (PHENERGAN) 12.5 mg in sodium chloride 0.9 % 50 mL IVPB (0 mg Intravenous Stopped 10/13/22 1644)  potassium chloride 10 mEq in 100 mL IVPB (10 mEq Intravenous New Bag/Given 10/13/22 1615)  famotidine (PEPCID) IVPB 20 mg premix (0 mg Intravenous Stopped 10/13/22 1826)  sodium chloride 0.9 % bolus 1,000 mL (1,000 mLs Intravenous New Bag/Given 10/13/22 1822)  potassium chloride 10 mEq in 100 mL IVPB (10 mEq Intravenous New  Bag/Given 10/13/22 1823)  metoCLOPramide (REGLAN) injection 10 mg (10 mg Intravenous Given 10/13/22 1859)    ED Course/ Medical Decision Making/ A&P                             Medical Decision Making Amount and/or Complexity of Data Reviewed Labs: ordered.  Risk Prescription drug management.   This patient presents to the ED with chief complaint(s) of vomiting and abdominal pain with pertinent past medical history of HTN, GERD, COPD, tobacco use .The complaint involves an extensive differential diagnosis and also carries with it a high risk of complications and morbidity.    The differential diagnosis includes gastritis, gastroparesis, gastroenteritis, pancreatitis, cyclical vomiting syndrome, other viral etiology    The initial plan is to obtain baseline labs including lipase and UA  Additional history obtained: Additional history obtained from spouse Records reviewed  GI notes  Initial Assessment:   On exam, patient is pale and ill-appearing but is not diaphoretic or toxic in appearance.  [name] reports feeling very lightheaded and "out of it".  Heart rate is normal around 80 with regular rhythm.  Lungs clear to auscultation bilaterally with adequate lung volumes.  Abdomen is soft with mild tenderness in the epigastric region.  Bowel sounds are normal.  Patient is not actively vomiting at time of exam.    Independent ECG/labs interpretation:  The following labs  were independently interpreted:  CBC with evidence of leukocytosis and erythrocytosis.  Metabolic panel significant for hypokalemia with a potassium of 2.6, hypocalcemia with a value of 6.8, hypoalbuminemia and mildly decreased AST and alk phos.  Anion gap is normal.  Kidney function is within normal range. UA with evidence of infection with large leukocytes and rare bacteria.  No evidence of proteinuria.  There is ketones present. Lipase is negative for pancreatitis. Magnesium is within normal range.  Independent visualization and interpretation of imaging: I independently visualized the following imaging with scope of interpretation limited to determining acute life threatening conditions related to emergency care: Not indicated at this time; patient has had EGD, colonoscopy, gastric emptying study and CT without acute abnormality to explain her symptoms   Treatment and Reassessment: Treated patient's nausea and vomiting initially with phenergan, but patient was still having extreme nausea with drinking water.  Gave IV Reglan without significant improvement in symptoms.  Patient was also given IV potassium for hypokalemia and calcium gluconate for hypocalcemia.  Patient is unable to tolerate PO liquids and will likely need hospital admission for management of electrolyte disturbances and symptomatic management    Consultations obtained:   I requested consultation with on-call hospitalist provider and spoke with, Dr. Denton Brick, who agreed with hospital admission.     Disposition:   Patient to be admitted to hospital for management of intractable nausea and vomiting with electrolyte disturbances and UTI.           Final Clinical Impression(s) / ED Diagnoses Final diagnoses:  Intractable nausea and vomiting  Hypokalemia  Hypocalcemia  Acute cystitis without hematuria    Rx / DC Orders ED Discharge Orders     None         Pat Kocher, [place] 10/13/22 1936    Milton Ferguson, MD 10/15/22 [phone]

## 2022-10-13 NOTE — Assessment & Plan Note (Signed)
Stable. -Resume nebivolol, spironolactone

## 2022-10-13 NOTE — Assessment & Plan Note (Signed)
Smokes ~ 1/2 a pack of cigarettes daily. -Counseled to quit smoking cigarettes

## 2022-10-13 NOTE — H&P (Signed)
History and Physical    [name] LKG:[id number] DOB: [birth year] DOA: 10/13/2022  PCP: Celene Squibb, MD  Patient coming from: Home  I have personally briefly reviewed patient's old medical records in [place]  Chief Complaint: Vomiting  HPI: [name] is a 57 y.o. female with medical history significant for COPD, tobacco abuse, hypertension, depression. Patient presented to the ED with complaints of multiple episodes of vomiting daily since- 1/16.  No abdominal pain.  No diarrhea.  Last bowel movement was yesterday, normal consistency.  No fevers no chills.  She denies urinary complaints-dysuria, no change in habits. Patient has a history of chronic vomiting ongoing for the past year.  She has followed up with GI, has had workup which has been unrevealing for etiology.  Patient was in the ED 1/18 for same symptoms, workup was reassuring, she passed p.o. challenge in the ED and was discharged with a prescription for Reglan.  ED Course: Stable vitals.  Potassium 2.6.  WBC 13.1.  Ca-6.8.  UA with large leukocytes.  A liter bolus given.  K and calcium supplementation started.  Hospitalist admit for hypokalemia, hypocalcemia and intractable vomiting.  Review of Systems: As per HPI all other systems reviewed and negative.  Past Medical History:  Diagnosis Date   Asthma    COPD (chronic obstructive pulmonary disease) ([place])    Depression    Hypertension    PONV (postoperative nausea and vomiting)    Seasonal allergies    TIA (transient ischemic attack) 01/2022    Past Surgical History:  Procedure Laterality Date   ABDOMINAL HYSTERECTOMY     BIOPSY  05/06/2022   Procedure: BIOPSY;  Surgeon: Eloise Harman, DO;  [facility];  Service: Endoscopy;;   CHOLECYSTECTOMY     COLONOSCOPY  05/21/2011   Procedure: COLONOSCOPY;  Surgeon: Rogene Houston, MD;  [facility];  Service: Endoscopy;  Laterality: N/A;  12:30   COLONOSCOPY WITH PROPOFOL N/A  07/23/2022   Procedure: COLONOSCOPY WITH PROPOFOL;  Surgeon: Eloise Harman, DO;  [facility];  Service: Endoscopy;  Laterality: N/A;  2:00pm, asa 2, pt knows to arrive at 11:30   ESOPHAGEAL DILATION  05/06/2022   Procedure: ESOPHAGEAL DILATION;  Surgeon: Eloise Harman, DO;  [facility];  Service: Endoscopy;;   ESOPHAGOGASTRODUODENOSCOPY     ESOPHAGOGASTRODUODENOSCOPY (EGD) WITH PROPOFOL N/A 05/06/2022   Procedure: ESOPHAGOGASTRODUODENOSCOPY (EGD) WITH PROPOFOL;  Surgeon: Eloise Harman, DO;  [facility];  Service: Endoscopy;  Laterality: N/A;  8:00AM   POLYPECTOMY  07/23/2022   Procedure: POLYPECTOMY;  Surgeon: Eloise Harman, DO;  [facility];  Service: Endoscopy;;   TUBAL LIGATION       reports that she has been smoking cigarettes. She has a 11.50 pack-year smoking history. She has been exposed to tobacco smoke. She has never used smokeless tobacco. She reports that she does not drink alcohol and does not use drugs.  Allergies  Allergen Reactions   Bee Venom Anaphylaxis   Motrin [Ibuprofen] Other (See Comments)    Avoid due to hiatal hernia    Codeine Nausea And Vomiting and Rash   Penicillins Nausea And Vomiting and Rash   Prilosec [Omeprazole] Swelling and Rash   Zantac [Ranitidine] Rash    Family History  Problem Relation Age of Onset   Hypertension Mother    Hyperlipidemia Mother    Stroke Mother    Kidney cancer Father  Colon cancer Other     Prior to Admission medications   Medication Sig Start Date End Date Taking? Authorizing Provider  acetaminophen (TYLENOL) 650 MG CR tablet Take 1,300 mg by mouth every 8 (eight) hours as needed for pain.   Yes [provider]  albuterol (PROVENTIL HFA;VENTOLIN HFA) 108 (90 BASE) MCG/ACT inhaler Inhale 2 puffs into the lungs every 6 (six) hours as needed for shortness of breath.    Yes [provider]  ALPRAZolam Duanne Moron) 0.5 MG tablet Take 0.5 mg by mouth  2 (two) times daily. 04/13/21  Yes [provider]  famotidine (PEPCID) 40 MG tablet Take 1 tablet (40 mg total) by mouth 2 (two) times daily. 05/02/22  Yes Jodi Mourning, Kristen S, PA-C  fluticasone (FLONASE) 50 MCG/ACT nasal spray Place 1-2 sprays into both nostrils daily.   Yes [provider]  metoCLOPramide (REGLAN) 10 MG tablet Take 1 tablet (10 mg total) by mouth every 6 (six) hours. 10/10/22  Yes Schutt, Grafton Folk, PA-C  nebivolol (BYSTOLIC) 10 MG tablet Take 10 mg by mouth at bedtime.   Yes [provider]  spironolactone (ALDACTONE) 25 MG tablet Take 1 tablet (25 mg total) by mouth daily. Patient taking differently: Take 25 mg by mouth at bedtime. 02/05/22 01/31/23 Yes Buford Dresser, MD  VRAYLAR 3 MG capsule Take 3 mg by mouth daily. 01/23/22  Yes [provider]  promethazine (PHENERGAN) 12.5 MG tablet Take 1-2 tablets by mouth every 6 hours as needed for nausea or vomiting. Patient not taking: Reported on 10/13/2022 07/17/22   Eloise Harman, DO    Physical Exam: Vitals:   10/13/22 1649 10/13/22 1650 10/13/22 1815 10/13/22 1816  BP: 130/80 130/80 133/75   Pulse: 71 78 85   Resp: '14 15 19   '$ Temp:   97.6 F (36.4 C) 97.9 F (36.6 C)  TempSrc:   Oral Oral  SpO2: 99% 100% 96%   Weight:      Height:        Constitutional: NAD, calm, comfortable Vitals:   10/13/22 1649 10/13/22 1650 10/13/22 1815 10/13/22 1816  BP: 130/80 130/80 133/75   Pulse: 71 78 85   Resp: '14 15 19   '$ Temp:   97.6 F (36.4 C) 97.9 F (36.6 C)  TempSrc:   Oral Oral  SpO2: 99% 100% 96%   Weight:      Height:       Eyes: PERRL, lids and conjunctivae normal ENMT: Mucous membranes are moist. Posterior pharynx clear of any exudate or lesions. Neck: normal, supple, no masses, no thyromegaly Respiratory: clear to auscultation bilaterally, no wheezing, no crackles.  Cardiovascular: Regular rate and rhythm, no murmurs / rubs / gallops. No extremity edema. 2+ pedal  pulses.  Abdomen: no tenderness, no masses palpated. No hepatosplenomegaly. Bowel sounds positive.  Musculoskeletal: no clubbing / cyanosis. No joint deformity upper and lower extremities.  Skin: no rashes, lesions, ulcers. No induration Neurologic: No apparent cranial nerve abnormality, moving extremities spontaneously.Marland Kitchen  Psychiatric: Normal judgment and insight. Alert and oriented x 3. Normal mood.   Labs on Admission: I have personally reviewed following labs and imaging studies  CBC: Recent Labs  Lab 10/10/22 1226 10/13/22 1400  WBC 13.4* 13.1*  NEUTROABS 9.5* 9.3*  HGB 15.9* 16.4*  HCT 46.5* 47.1*  MCV 95.5 95.2  PLT 210 009   Basic Metabolic Panel: Recent Labs  Lab 10/10/22 1226 10/13/22 1400 10/13/22 1636  NA 135 140  --   K 4.0 2.6*  --  CL 96* 112*  --   CO2 27 21*  --   GLUCOSE 108* 75  --   BUN 15 9  --   CREATININE 0.86 0.53  --   CALCIUM 9.3 6.8*  --   MG 2.2  --  2.2   GFR: Estimated Creatinine Clearance: 63.6 mL/min (by C-G formula based on SCr of 0.53 mg/dL). Liver Function Tests: Recent Labs  Lab 10/10/22 1226 10/13/22 1400  AST 24 12*  ALT 22 12  ALKPHOS 54 33*  BILITOT 2.1* 0.6  PROT 7.7 5.2*  ALBUMIN 4.7 3.0*   Recent Labs  Lab 10/10/22 1226 10/13/22 1400  LIPASE 34 32   Urine analysis:    Component Value Date/Time   COLORURINE STRAW (A) 10/13/2022 1400   APPEARANCEUR CLEAR 10/13/2022 1400   LABSPEC 1.008 10/13/2022 1400   PHURINE 6.0 10/13/2022 1400   GLUCOSEU NEGATIVE 10/13/2022 1400   HGBUR NEGATIVE 10/13/2022 1400   BILIRUBINUR NEGATIVE 10/13/2022 1400   KETONESUR 20 (A) 10/13/2022 1400   PROTEINUR NEGATIVE 10/13/2022 1400   NITRITE NEGATIVE 10/13/2022 1400   LEUKOCYTESUR LARGE (A) 10/13/2022 1400    Radiological Exams on Admission: No results found.  EKG: Independently reviewed.  Sinus rhythm, rate 77, QTc 434.  PVCs present.  No significant change from prior.  Assessment/Plan Principal Problem:    Hypokalemia Active Problems:   Intractable nausea and vomiting   Hypocalcemia   Essential hypertension   Tobacco abuse counseling   Depression  Assessment and Plan: * Hypokalemia Hypokalemia 2.6, Normal magnesium-2.2.  Hypokalemia likely secondary to acute and chronic vomiting.  Denies marijuana use.  No abdominal symptoms.  Afebrile.  WBC 13.1.  UA with positive leukocytes, but she denies urinary symptoms.  Also with hypocalcemia of 6.8, which was normal just 3 days ago. - Replete Lytes - D5/N/s + 40 kcl 100cc/hr x 20hrs - Clear liquid diet, advance diet as tolerated -UDS  Intractable nausea and vomiting Acute on chronic.  Ongoing for about a year now.  Acute episode of 5 days.  Followed with GI, workup has included CT abdomen and pelvis with contrast-unrevealing for etiology, with follow-up MRI liver -which showed a tiny right hepatic lesion likely benign.  Also normal gastric emptying study, EGD 04/2022-short segment of Barrett's, mild schatzki's ring dilated, gastritis, colonoscopy 06/2022-diverticuli, a sessile polyp, and hemorrhoids. -Clear liquid diet advance as tolerated -Zofran as needed -Denies urinary symptoms, UA with large leukocytes, held off on antibiotics, follow-up urine cultures ordered in the ED    Tobacco abuse counseling Smokes ~ 1/2 a pack of cigarettes daily. -Counseled to quit smoking cigarettes  Essential hypertension Stable. -Resume nebivolol, spironolactone  Depression Resume Vraylar   DVT prophylaxis: Lovenox Code Status: Full Family Communication: None at bedside Disposition Plan: ~ 1- 2 days Consults called: None  Admission status:  Obs tele    Author: Bethena Roys, MD 10/13/2022 8:38 PM  For on call review [email or web address].

## 2022-10-13 NOTE — Assessment & Plan Note (Addendum)
Acute on chronic.  Ongoing for about a year now.  Acute episode of 5 days.  Followed with GI, workup has included CT abdomen and pelvis with contrast-unrevealing for etiology, with follow-up MRI liver -which showed a tiny right hepatic lesion likely benign.  Also normal gastric emptying study, EGD 04/2022-short segment of Barrett's, mild schatzki's ring dilated, gastritis, colonoscopy 06/2022-diverticuli, a sessile polyp, and hemorrhoids. -Clear liquid diet advance as tolerated -Zofran as needed -Denies urinary symptoms, UA with large leukocytes, held off on antibiotics, follow-up urine cultures ordered in the ED

## 2022-10-14 DIAGNOSIS — E876 Hypokalemia: Secondary | ICD-10-CM | POA: Diagnosis not present

## 2022-10-14 DIAGNOSIS — R112 Nausea with vomiting, unspecified: Secondary | ICD-10-CM | POA: Diagnosis not present

## 2022-10-14 LAB — BASIC METABOLIC PANEL
Anion gap: 4 — ABNORMAL LOW (ref 5–15)
BUN: 8 mg/dL (ref 6–20)
CO2: 23 mmol/L (ref 22–32)
Calcium: 8 mg/dL — ABNORMAL LOW (ref 8.9–10.3)
Chloride: 112 mmol/L — ABNORMAL HIGH (ref 98–111)
Creatinine, Ser: 0.69 mg/dL (ref 0.44–1.00)
GFR, Estimated: >60 mL/min (ref >60)
Glucose, Bld: 139 mg/dL — ABNORMAL HIGH (ref 70–99)
Potassium: 4.6 mmol/L (ref 3.5–5.1)
Sodium: 139 mmol/L (ref 135–145)

## 2022-10-14 LAB — CBC WITH DIFFERENTIAL/PLATELET
Abs Immature Granulocytes: 0.01 10*3/uL (ref 0.00–0.07)
Basophils Absolute: 0.1 10*3/uL (ref 0.0–0.1)
Basophils Relative: 1 %
Eosinophils Absolute: 0.3 10*3/uL (ref 0.0–0.5)
Eosinophils Relative: 5 %
HCT: 39.3 % (ref 36.0–46.0)
Hemoglobin: 13.1 g/dL (ref 12.0–15.0)
Immature Granulocytes: 0 %
Lymphocytes Relative: 31 %
Lymphs Abs: 2 10*3/uL (ref 0.7–4.0)
MCH: 32.6 pg (ref 26.0–34.0)
MCHC: 33.3 g/dL (ref 30.0–36.0)
MCV: 97.8 fL (ref 80.0–100.0)
Monocytes Absolute: 0.4 10*3/uL (ref 0.1–1.0)
Monocytes Relative: 7 %
Neutro Abs: 3.5 10*3/uL (ref 1.7–7.7)
Neutrophils Relative %: 56 %
Platelets: 161 10*3/uL (ref 150–400)
RBC: 4.02 MIL/uL (ref 3.87–5.11)
RDW: 13.4 % (ref 11.5–15.5)
WBC: 6.3 10*3/uL (ref 4.0–10.5)
nRBC: 0 % (ref 0.0–0.2)

## 2022-10-14 LAB — RAPID URINE DRUG SCREEN, HOSP PERFORMED
Amphetamines: NOT DETECTED
Barbiturates: NOT DETECTED
Benzodiazepines: POSITIVE — AB
Cocaine: NOT DETECTED
Opiates: NOT DETECTED
Tetrahydrocannabinol: NOT DETECTED

## 2022-10-14 NOTE — Progress Notes (Signed)
[facility] Progress Note  Patient: [name]     NFA:[id number]  DOA: 10/13/2022   PCP: Celene Squibb, MD       Brief hospital course: This is a 57 year old female who has chronic vomiting and GI workup has been unrevealing so far. She also has tobacco abuse, COPD, hypertension and depression. She presented to the ED on 1/18 for vomiting that had started on 1/16 and was discharged home with Reglan.  She presented again to the ED on 1/21 for vomiting and was noted to have a potassium of 2.6, WBC count of 13.1 and a calcium of 6.8 which corrected to 7.6. In the ED, given a dose of Reglan and a dose of Phenergan and a number of fluid boluses Subjective:  She is able to drink clear liquids without any vomiting. Assessment and Plan: Principal Problem:   Hypokalemia - Has been replaced to 4.6-continue to follow  Active Problems:   Intractable nausea and vomiting with dehydration   GERD and Barrett's esophagus confirmed on biopsy on 8/23   Status postcholecystectomy - Unable to tolerate PPIs and takes famotidine as outpatient - Last evaluated on 07/17/2022 by Dr. Abbey Chatters who did not have a clear answer for her recurrent nausea and vomiting - Symptoms to be resolving-as needed Zofran is ordered - Advance to full liquids-cut IV fluids back to 75 cc an hour from 100 and follow-up tomorrow    Hypocalcemia - Treated with IV calcium and has improved to 8.0 today - Continue to follow    Essential hypertension - Continuing nebivolol with holding parameters -hold spironolactone      Tobacco abuse counseling, COPD - She smokes half a pack a day-does not want a nicotine patch - No wheezing noted on exam and no complaint of shortness of breath Bipolar disorder - Stable on Vraylar and Xanax     Code Status: Full Code Consultants: None Level of Care: Level of care: Telemetry Total time on patient care: 35 minutes DVT prophylaxis:  enoxaparin (LOVENOX) injection 40 mg Start:  10/13/22 2200     Objective:   Vitals:   10/13/22 2030 10/13/22 2100 10/13/22 2134 10/14/22 0532  BP: 132/83 133/80 121/69 (!) 90/53  Pulse: 73 75 83 64  Resp: (!) '21 14 14 17  '$ Temp:   97.8 F (36.6 C) 98.8 F (37.1 C)  TempSrc:   Oral Oral  SpO2: 98% 100% 98% 96%  Weight:   57 kg   Height:   '5\' 2"'$  (1.575 m)    Filed Weights   10/13/22 1322 10/13/22 2134  Weight: 54.4 kg 57 kg   Exam: General exam: Appears comfortable  HEENT: oral mucosa moist Respiratory system: Clear to auscultation.  Cardiovascular system: S1 & S2 heard  Gastrointestinal system: Abdomen soft, tender in epigastrium, nondistended. Normal bowel sounds   Extremities: No cyanosis, clubbing or edema Psychiatry:  Mood & affect appropriate.      CBC: Recent Labs  Lab 10/10/22 1226 10/13/22 1400 10/14/22 0938  WBC 13.4* 13.1* 6.3  NEUTROABS 9.5* 9.3* 3.5  HGB 15.9* 16.4* 13.1  HCT 46.5* 47.1* 39.3  MCV 95.5 95.2 97.8  PLT 210 212 469   Basic Metabolic Panel: Recent Labs  Lab 10/10/22 1226 10/13/22 1400 10/13/22 1636 10/13/22 2002 10/14/22 0409  NA 135 140  --  140 139  K 4.0 2.6*  --  4.0 4.6  CL 96* 112*  --  109 112*  CO2 27 21*  --  24 23  GLUCOSE 108* 75  --  79 139*  BUN 15 9  --  9 8  CREATININE 0.86 0.53  --  0.75 0.69  CALCIUM 9.3 6.8*  --  8.2* 8.0*  MG 2.2  --  2.2  --   --    GFR: Estimated Creatinine Clearance: 63.6 mL/min (by C-G formula based on SCr of 0.69 mg/dL).  Scheduled Meds:  ALPRAZolam  0.5 mg Oral BID   cariprazine  3 mg Oral Daily   enoxaparin (LOVENOX) injection  40 mg Subcutaneous QHS   fluticasone  1-2 spray Each Nare Daily   nebivolol  10 mg Oral QHS   spironolactone  25 mg Oral QHS   Continuous Infusions:  dextrose 5 % and 0.9 % NaCl with KCl 40 mEq/L 100 mL/hr at 10/13/22 2322   Imaging and lab data was personally reviewed No results found.  LOS: 0 days   Author: Debbe Odea  10/14/2022 12:42 PM  To contact [facility]>   Check the  care team in [facility] and look for the attending/consulting [place] provider listed  Log into [email or web address] and use [facility] universal password   Go to> "[facility]"  and find provider  If you still have difficulty reaching the provider, please page the [facility] (Director on Call) for the Hospitalists listed on amion

## 2022-10-14 NOTE — TOC Progression Note (Signed)
  Transition of Care [facility]) Screening Note   Patient Details  Name: [name] Date of Birth: [birth year]   Transition of Care [facility]) CM/SW Contact:    Boneta Lucks, RN Phone Number: 10/14/2022, 12:37 PM    Transition of Care Department [facility]) has reviewed patient and no TOC needs have been identified at this time. We will continue to monitor patient advancement through interdisciplinary progression rounds. If new patient transition needs arise, please place a TOC consult.       Barriers to Discharge: Continued Medical Work up  Expected Discharge Plan and Services       Living arrangements for the past 2 months: Single Family Home                       Social Determinants of Health (SDOH) Interventions SDOH Screenings   Food Insecurity: No Food Insecurity (10/13/2022)  Housing: Low Risk  (10/13/2022)  Transportation Needs: No Transportation Needs (10/13/2022)  Utilities: Not At Risk (10/13/2022)  Tobacco Use: High Risk (10/13/2022)    Readmission Risk Interventions     No data to display

## 2022-10-15 DIAGNOSIS — Z83438 Family history of other disorder of lipoprotein metabolism and other lipidemia: Secondary | ICD-10-CM | POA: Diagnosis not present

## 2022-10-15 DIAGNOSIS — Z91030 Bee allergy status: Secondary | ICD-10-CM | POA: Diagnosis not present

## 2022-10-15 DIAGNOSIS — F319 Bipolar disorder, unspecified: Secondary | ICD-10-CM | POA: Diagnosis present

## 2022-10-15 DIAGNOSIS — F17210 Nicotine dependence, cigarettes, uncomplicated: Secondary | ICD-10-CM | POA: Diagnosis present

## 2022-10-15 DIAGNOSIS — I10 Essential (primary) hypertension: Secondary | ICD-10-CM | POA: Diagnosis present

## 2022-10-15 DIAGNOSIS — Z823 Family history of stroke: Secondary | ICD-10-CM | POA: Diagnosis not present

## 2022-10-15 DIAGNOSIS — Z8249 Family history of ischemic heart disease and other diseases of the circulatory system: Secondary | ICD-10-CM | POA: Diagnosis not present

## 2022-10-15 DIAGNOSIS — J4489 Other specified chronic obstructive pulmonary disease: Secondary | ICD-10-CM | POA: Diagnosis present

## 2022-10-15 DIAGNOSIS — K2270 Barrett's esophagus without dysplasia: Secondary | ICD-10-CM | POA: Diagnosis present

## 2022-10-15 DIAGNOSIS — K219 Gastro-esophageal reflux disease without esophagitis: Secondary | ICD-10-CM | POA: Diagnosis present

## 2022-10-15 DIAGNOSIS — K449 Diaphragmatic hernia without obstruction or gangrene: Secondary | ICD-10-CM | POA: Diagnosis present

## 2022-10-15 DIAGNOSIS — E876 Hypokalemia: Secondary | ICD-10-CM | POA: Diagnosis present

## 2022-10-15 DIAGNOSIS — Z79899 Other long term (current) drug therapy: Secondary | ICD-10-CM | POA: Diagnosis not present

## 2022-10-15 DIAGNOSIS — N3000 Acute cystitis without hematuria: Secondary | ICD-10-CM | POA: Diagnosis present

## 2022-10-15 DIAGNOSIS — E860 Dehydration: Secondary | ICD-10-CM | POA: Diagnosis present

## 2022-10-15 DIAGNOSIS — Z880 Allergy status to penicillin: Secondary | ICD-10-CM | POA: Diagnosis not present

## 2022-10-15 DIAGNOSIS — R112 Nausea with vomiting, unspecified: Secondary | ICD-10-CM | POA: Diagnosis present

## 2022-10-15 DIAGNOSIS — Z885 Allergy status to narcotic agent status: Secondary | ICD-10-CM | POA: Diagnosis not present

## 2022-10-15 DIAGNOSIS — J302 Other seasonal allergic rhinitis: Secondary | ICD-10-CM | POA: Diagnosis present

## 2022-10-15 DIAGNOSIS — Z8673 Personal history of transient ischemic attack (TIA), and cerebral infarction without residual deficits: Secondary | ICD-10-CM | POA: Diagnosis not present

## 2022-10-15 DIAGNOSIS — T43595A Adverse effect of other antipsychotics and neuroleptics, initial encounter: Secondary | ICD-10-CM | POA: Diagnosis present

## 2022-10-15 LAB — COMPREHENSIVE METABOLIC PANEL
ALT: 13 U/L (ref 0–44)
AST: 15 U/L (ref 15–41)
Albumin: 3.3 g/dL — ABNORMAL LOW (ref 3.5–5.0)
Alkaline Phosphatase: 35 U/L — ABNORMAL LOW (ref 38–126)
Anion gap: 7 (ref 5–15)
BUN: <5 mg/dL — ABNORMAL LOW (ref 6–20)
CO2: 25 mmol/L (ref 22–32)
Calcium: 8.5 mg/dL — ABNORMAL LOW (ref 8.9–10.3)
Chloride: 108 mmol/L (ref 98–111)
Creatinine, Ser: 0.79 mg/dL (ref 0.44–1.00)
GFR, Estimated: >60 mL/min (ref >60)
Glucose, Bld: 87 mg/dL (ref 70–99)
Potassium: 4 mmol/L (ref 3.5–5.1)
Sodium: 140 mmol/L (ref 135–145)
Total Bilirubin: 1.2 mg/dL (ref 0.3–1.2)
Total Protein: 5.6 g/dL — ABNORMAL LOW (ref 6.5–8.1)

## 2022-10-15 LAB — URINE CULTURE

## 2022-10-15 LAB — TSH: TSH: 1.106 u[IU]/mL (ref 0.350–4.500)

## 2022-10-15 MED ORDER — METOCLOPRAMIDE HCL 10 MG PO TABS
10.0000 mg | ORAL_TABLET | Freq: Four times a day (QID) | ORAL | Status: DC
  Administered 2022-10-15 (×2): 10 mg via ORAL
  Filled 2022-10-15 (×2): qty 1

## 2022-10-15 MED ORDER — SUCRALFATE 1 GM/10ML PO SUSP
1.0000 g | Freq: Three times a day (TID) | ORAL | Status: DC
  Administered 2022-10-15 – 2022-10-17 (×8): 1 g via ORAL
  Filled 2022-10-15 (×8): qty 10

## 2022-10-15 MED ORDER — ORAL CARE MOUTH RINSE: 15.0000 mL | OROMUCOSAL | Status: DC | PRN

## 2022-10-15 MED ORDER — METOCLOPRAMIDE HCL 10 MG PO TABS
5.0000 mg | ORAL_TABLET | Freq: Three times a day (TID) | ORAL | Status: DC
  Administered 2022-10-16 – 2022-10-17 (×5): 5 mg via ORAL
  Filled 2022-10-15 (×5): qty 1

## 2022-10-15 MED ORDER — SPIRONOLACTONE 25 MG PO TABS
25.0000 mg | ORAL_TABLET | Freq: Every day | ORAL | Status: DC
  Administered 2022-10-15 – 2022-10-16 (×2): 25 mg via ORAL
  Filled 2022-10-15 (×2): qty 1

## 2022-10-15 MED ORDER — FAMOTIDINE 20 MG PO TABS
40.0000 mg | ORAL_TABLET | Freq: Two times a day (BID) | ORAL | Status: DC
  Administered 2022-10-15 – 2022-10-17 (×5): 40 mg via ORAL
  Filled 2022-10-15 (×5): qty 2

## 2022-10-15 NOTE — Consult Note (Signed)
Gastroenterology Consult   Referring Provider: No ref. provider found Primary Care Physician:  Celene Squibb, MD Primary Gastroenterologist:  Dr. Abbey Chatters   Patient ID: [name]; [id number]; 1965-09-25   Admit date: 10/13/2022  LOS: 0 days   Date of Consultation: 10/15/2022 Reason for Consultation:  nausea/vomiting   History of Present Illness   [name] is a 57 y.o. year old female with medical history of asthma, COPD, depression, HTN, TIA, chronic nausea and vomiting who presented to the ED on 1/21 c/o vomiting since 1/16 and syncopal episodes. Notably also seen in the ED on 1/18, sent home with reglan and phenergan. Reportedly unable to keep down liquids which prompted her presentation to the ER. GI consulted for further evaluation.   ED course: Calcium 8.5, other labs mostly unremarkable   Consult:  Patient reports n/v since July that has progressively worsened last Tuesday. She reports she came last week then returned as she was not even able to keep pedialyte down. She was not taking any nausea medicine at home. She reports her stomach is burning. Denies otherwise abdominal pain. No precipitating or alleviating factors. Has constipation where she may not have a BM for 2-3 days. She is taking 1 capful of miralax per day.  Denies rectal bleeding or melena. No hematemesis or coffee ground emesis. She reports she has been on Vraylar for about 2 years, dose was changed in may (increased). She smokes 1/2 PPD, no alcohol use. Denies illicit drugs. Taking pepcid '40mg'$  daily without improvement in her symptoms. She reports previously weighing 154 pounds and now down to 125, this has occurred since July of 2023. Before last Tuesday she reports some intermittent nausea and vomiting, may go a few days without n/v. Appetite has remained minimal, she also reports eating makes her symptoms worse. Was trying to eat small meals previously and doing 2 protein shakes per day. No frequent NSAID use.  She does note some dizziness and feeling off balance, this tends to be worse upon standing and first thing in the morning. She has had issues with BP fluctuation that was thought related to hormone imbalance/renal issues.   GES: 04/2022 normal  Last EGD:8/23 - 3 cm hiatal hernia. - Esophageal mucosal changes consistent with short-segment Barrett's esophagus. Biopsied. - Mild Schatzki ring. Dilated. - Gastritis. Biopsied. - Normal duodenal bulb, first portion of the duodenum and second portion of the duodenum. Last Colonoscopy: 10/23  - Non-bleeding internal hemorrhoids.  - Diverticulosis in the sigmoid colon. - One 5 mm polyp in the descending colon, removed with a cold snare. Resected and retrieved. - The examination was otherwise normal.  Past Medical History:  Diagnosis Date   Asthma    COPD (chronic obstructive pulmonary disease) (HCC)    Depression    Hypertension    PONV (postoperative nausea and vomiting)    Seasonal allergies    TIA (transient ischemic attack) 01/2022    Past Surgical History:  Procedure Laterality Date   ABDOMINAL HYSTERECTOMY     BIOPSY  05/06/2022   Procedure: BIOPSY;  Surgeon: Eloise Harman, DO;  [facility];  Service: Endoscopy;;   CHOLECYSTECTOMY     COLONOSCOPY  05/21/2011   Procedure: COLONOSCOPY;  Surgeon: Rogene Houston, MD;  [facility];  Service: Endoscopy;  Laterality: N/A;  12:30   COLONOSCOPY WITH PROPOFOL N/A 07/23/2022   Procedure: COLONOSCOPY WITH PROPOFOL;  Surgeon: Eloise Harman, DO;  [facility];  Service: Endoscopy;  Laterality: N/A;  2:00pm, asa 2, pt knows to arrive at 11:30   ESOPHAGEAL DILATION  05/06/2022   Procedure: ESOPHAGEAL DILATION;  Surgeon: Eloise Harman, DO;  [facility];  Service: Endoscopy;;   ESOPHAGOGASTRODUODENOSCOPY     ESOPHAGOGASTRODUODENOSCOPY (EGD) WITH PROPOFOL N/A 05/06/2022   Procedure: ESOPHAGOGASTRODUODENOSCOPY (EGD) WITH PROPOFOL;  Surgeon: Eloise Harman, DO;  [facility];  Service: Endoscopy;  Laterality: N/A;  8:00AM   POLYPECTOMY  07/23/2022   Procedure: POLYPECTOMY;  Surgeon: Eloise Harman, DO;  [facility];  Service: Endoscopy;;   TUBAL LIGATION      Prior to Admission medications   Medication Sig Start Date End Date Taking? Authorizing Provider  acetaminophen (TYLENOL) 650 MG CR tablet Take 1,300 mg by mouth every 8 (eight) hours as needed for pain.   Yes [provider]  albuterol (PROVENTIL HFA;VENTOLIN HFA) 108 (90 BASE) MCG/ACT inhaler Inhale 2 puffs into the lungs every 6 (six) hours as needed for shortness of breath.    Yes [provider]  ALPRAZolam Duanne Moron) 0.5 MG tablet Take 0.5 mg by mouth 2 (two) times daily. 04/13/21  Yes [provider]  famotidine (PEPCID) 40 MG tablet Take 1 tablet (40 mg total) by mouth 2 (two) times daily. 05/02/22  Yes Jodi Mourning, Kristen S, PA-C  fluticasone (FLONASE) 50 MCG/ACT nasal spray Place 1-2 sprays into both nostrils daily.   Yes [provider]  metoCLOPramide (REGLAN) 10 MG tablet Take 1 tablet (10 mg total) by mouth every 6 (six) hours. 10/10/22  Yes Schutt, Grafton Folk, PA-C  nebivolol (BYSTOLIC) 10 MG tablet Take 10 mg by mouth at bedtime.   Yes [provider]  spironolactone (ALDACTONE) 25 MG tablet Take 1 tablet (25 mg total) by mouth daily. Patient taking differently: Take 25 mg by mouth at bedtime. 02/05/22 01/31/23 Yes Buford Dresser, MD  VRAYLAR 3 MG capsule Take 3 mg by mouth daily. 01/23/22  Yes [provider]  promethazine (PHENERGAN) 12.5 MG tablet Take 1-2 tablets by mouth every 6 hours as needed for nausea or vomiting. Patient not taking: Reported on 10/13/2022 07/17/22   Eloise Harman, DO    Current Facility-Administered Medications  Medication Dose Route Frequency Provider Last Rate Last Admin   acetaminophen (TYLENOL) tablet 650 mg  650 mg Oral Q6H PRN Emokpae, Ejiroghene E, MD    650 mg at 10/14/22 1107   Or   acetaminophen (TYLENOL) suppository 650 mg  650 mg Rectal Q6H PRN Emokpae, Ejiroghene E, MD       albuterol (PROVENTIL) (2.5 MG/3ML) 0.083% nebulizer solution 2.5 mg  2.5 mg Nebulization Q6H PRN Pham, Minh Q, RPH-CPP       ALPRAZolam (XANAX) tablet 0.5 mg  0.5 mg Oral BID Emokpae, Ejiroghene E, MD   0.5 mg at 10/15/22 0858   cariprazine (VRAYLAR) capsule 3 mg  3 mg Oral Daily Emokpae, Ejiroghene E, MD   3 mg at 10/15/22 0857   enoxaparin (LOVENOX) injection 40 mg  40 mg Subcutaneous QHS Emokpae, Ejiroghene E, MD   40 mg at 10/14/22 2241   fluticasone (FLONASE) 50 MCG/ACT nasal spray 1-2 spray  1-2 spray Each Nare Daily Emokpae, Ejiroghene E, MD   1 spray at 10/14/22 0846   metoCLOPramide (REGLAN) tablet 10 mg  10 mg Oral Q6H Rizwan, Saima, MD       nebivolol (BYSTOLIC) tablet 10 mg  10 mg Oral QHS Debbe Odea, MD   10 mg at 10/14/22  2241   ondansetron (ZOFRAN) tablet 4 mg  4 mg Oral Q6H PRN Emokpae, Ejiroghene E, MD       Or   ondansetron (ZOFRAN) injection 4 mg  4 mg Intravenous Q6H PRN Emokpae, Ejiroghene E, MD   4 mg at 10/15/22 0855   polyethylene glycol (MIRALAX / GLYCOLAX) packet 17 g  17 g Oral Daily PRN Emokpae, Ejiroghene E, MD       spironolactone (ALDACTONE) tablet 25 mg  25 mg Oral QHS Rizwan, Eunice Blase, MD       traZODone (DESYREL) tablet 50 mg  50 mg Oral QHS PRN Emokpae, Ejiroghene E, MD   50 mg at 10/15/22 0026    Allergies as of 10/13/2022 - Review Complete 10/13/2022  Allergen Reaction Noted   Bee venom Anaphylaxis 03/07/2021   Motrin [ibuprofen] Other (See Comments) 07/18/2022   Codeine Nausea And Vomiting and Rash 05/21/2011   Penicillins Nausea And Vomiting and Rash 05/21/2011   Prilosec [omeprazole] Swelling and Rash 03/15/2013   Zantac [ranitidine] Rash 03/07/2021    Family History  Problem Relation Age of Onset   Hypertension Mother    Hyperlipidemia Mother    Stroke Mother    Kidney cancer Father    Colon cancer Other      Social History   Socioeconomic History   Marital status: Married    Spouse name: Not on file   Number of children: 2   Years of education: 12   Highest education level: High school graduate  Occupational History   Occupation: [employer]  Tobacco Use   Smoking status: Every Day    Packs/day: 0.50    Years: 23.00    Total pack years: 11.50    Types: Cigarettes    Passive exposure: Current   Smokeless tobacco: Never  Vaping Use   Vaping Use: Every day  Substance and Sexual Activity   Alcohol use: No   Drug use: No   Sexual activity: Yes    Birth control/protection: Surgical  Other Topics Concern   Not on file  Social History Narrative   Lives at home with husband.   Right-handed.   2 cups of caffeine per day.   Social Determinants of Health   Financial Resource Strain: Not on file  Food Insecurity: No Food Insecurity (10/13/2022)   Hunger Vital Sign    Worried About Running Out of Food in the Last Year: Never true    Ran Out of Food in the Last Year: Never true  Transportation Needs: No Transportation Needs (10/13/2022)   PRAPARE - Hydrologist (Medical): No    Lack of Transportation (Non-Medical): No  Physical Activity: Not on file  Stress: Not on file  Social Connections: Not on file  Intimate Partner Violence: Not At Risk (10/13/2022)   Humiliation, Afraid, Rape, and Kick questionnaire    Fear of Current or Ex-Partner: No    Emotionally Abused: No    Physically Abused: No    Sexually Abused: No    Review of Systems   Gen: Denies any fever, chills, +decreased appetite +weight loss  CV: Denies chest pain, heart palpitations, syncope, edema  Resp: Denies shortness of breath with rest, cough, wheezing, coughing up blood, and pleurisy. GI:  denies melena, hematochezia, diarrhea, dysphagia, odyonophagia, early satiety. +nausea +vomiting +weight loss +burning abdominal discomfort GU : Denies urinary burning, blood in urine,  urinary frequency, and urinary incontinence. MS: Denies joint pain, limitation of movement, swelling, cramps, and atrophy.  Derm: Denies rash, itching, dry skin, hives. Psych: Denies depression, anxiety, memory loss, hallucinations, and confusion. Heme: Denies bruising or bleeding Neuro:  Denies any headaches, dizziness, paresthesias, shaking  Physical Exam   Vital Signs in last 24 hours: Temp:  [98 F (36.7 C)-98.6 F (37 C)] 98 F (36.7 C) (01/23 0519) Pulse Rate:  [65-78] 65 (01/23 0519) Resp:  [16-18] 16 (01/23 0519) BP: (109-120)/(69-80) 109/80 (01/23 0519) SpO2:  [96 %-98 %] 98 % (01/23 0519) Last BM Date : 10/14/22  General:   Alert,  Well-developed, well-nourished, pleasant and cooperative in NAD Head:  Normocephalic and atraumatic. Eyes:  Sclera clear, no icterus.   Conjunctiva pink. Ears:  Normal auditory acuity. Mouth:  No deformity or lesions, dentition normal. Neck:  Supple; no masses Lungs:  Clear throughout to auscultation.   No wheezes, crackles, or rhonchi. No acute distress. Heart:  Regular rate and rhythm; no murmurs, clicks, rubs,  or gallops. Abdomen:  Soft, and nondistended. TTP of diffuse upper abdomen. No masses, hepatosplenomegaly or hernias noted. Normal bowel sounds, without guarding, and without rebound.   Msk:  Symmetrical without gross deformities. Normal posture. Extremities:  Without clubbing or edema. Neurologic:  Alert and  oriented x4. Skin:  Intact without significant lesions or rashes. Psych:  Alert and cooperative. Normal mood and affect.  Labs/Studies   Recent Labs Recent Labs    10/13/22 1400 10/14/22 0938  WBC 13.1* 6.3  HGB 16.4* 13.1  HCT 47.1* 39.3  PLT 212 161   BMET Recent Labs    10/13/22 2002 10/14/22 0409 10/15/22 0351  NA 140 139 140  K 4.0 4.6 4.0  CL 109 112* 108  CO2 '24 23 25  '$ GLUCOSE 79 139* 87  BUN 9 8 <5*  CREATININE 0.75 0.69 0.79  CALCIUM 8.2* 8.0* 8.5*   LFT Recent Labs    10/13/22 1400  10/15/22 0351  PROT 5.2* 5.6*  ALBUMIN 3.0* 3.3*  AST 12* 15  ALT 12 13  ALKPHOS 33* 35*  BILITOT 0.6 1.2    Assessment   MERIKAY LESNIEWSKI is a 57 y.o. year old female is a 57 year old female with medical history of asthma, COPD, depression, HTN, TIA, chronic nausea and vomiting who presented to the ED on 1/21 c/o vomiting since 1/16 and syncopal episodes. Notably also seen in the ED on 1/18, sent home with reglan and phenergan. Reportedly unable to keep down liquids which prompted her presentation to the ER. Extensive GI workup in the recent past has been unremarkable. GI consulted for further evaluation.   Chronic nausea/vomiting:  ongoing since July 2023, worse over the past week. GI workup with EGD/GES/colonoscopy has been mostly unremarkable for causes of her n/v other than findings of gastritis. She cannot tolerate PPI therapy due to previous allergy to omeprazole, has been on famotidine '40mg'$  daily as outpatient. Has ongoing burning epigastric discomfort. She is s/p cholecystectomy in the past. No etoh or NSAID use. Notably with MR brain in April 2023 without any acute findings. She is on Vraylar which is known to cause n/v, however has been on this for about 2 years, recent dosage increase last May prior to symptoms beginning. While gastritis could certainly be contributing to her symptoms, may need to consider other causes such as medication induced n/v/ or abnormal cortisol levels. Will also check TSH. Will start famotidine '40mg'$  BID and carafate 1g QID. Can continue with reglan '10mg'$  q6h and zofran as needed.    Plan / Recommendations  Continue reglan '10mg'$  q6h Continue liquid diet as tolerated for now  Pepcid '40mg'$  BID Carafate 1g QID Fasting am cortisol levels  6. Zofran prn 7. TSH   10/15/2022, 9:29 AM  Ahja Martello L. Alver Sorrow, MSN, APRN, AGNP-C Adult-Gerontology Nurse Practitioner [facility] Gastroenterology at [facility]

## 2022-10-15 NOTE — Progress Notes (Signed)
[facility] Progress Note  Patient: [name]     SJG:[id number]  DOA: 10/13/2022   PCP: Celene Squibb, MD       Brief hospital course: This is a 57 year old female who has chronic vomiting and GI workup has been unrevealing so far. She also has tobacco abuse, COPD, hypertension and depression. She presented to the ED on 1/18 for vomiting that had started on 1/16 and was discharged home with Reglan.  She presented again to the ED on 1/21 for vomiting and was noted to have a potassium of 2.6, WBC count of 13.1 and a calcium of 6.8 which corrected to 7.6.  In the ED, given a dose of Reglan, a dose of Phenergan, fluid boluses, Ca Gluconate, and started by admitted on clear liquids and Zofran PRN.  Subjective:  Continues to have nausea with full liquids.  Assessment and Plan: Principal Problem:  Intractable nausea and vomiting with dehydration   GERD and Barrett's esophagus confirmed on biopsy on 8/23   Status postcholecystectomy - extensive GI w/u in the past- Last evaluated on 07/17/2022 by Dr. Abbey Chatters who did not have a clear answer for her recurrent nausea and vomiting - Unable to tolerate PPIs and takes famotidine as outpatient which has not helped - has been taking the outpt Reglan as prescribed on 1/16 but this did not help - tolerated clears- advanced to full liquids 1/22 and now having nausea again - check Cortisol level tomorrow AM -  she and her spouse have requested GI see her again and I have consulted GI  Active Problems:       Hypokalemia - in setting of poor oral intake and vomiting - 2.8> 4.6 - 4.0 today     Hypocalcemia - corrected calcium 7.6 when admitted - Treated with IV calcium x 1-  -  has improved to 8.5 today - Continue to follow    Essential hypertension - Continuing nebivolol with holding parameters  - cont Aldactone - EF 65-46%, no diastolic dysfunction, trivial TR in 01/02/22    Tobacco abuse counseling, COPD - She smokes half a  pack a day-does not want a nicotine patch - No wheezing noted on exam and no complaint of shortness of breath  Bipolar disorder - Stable on Vraylar and Xanax     Code Status: Full Code Consultants: None Level of Care: Level of care: Telemetry Total time on patient care: 35 minutes DVT prophylaxis:  enoxaparin (LOVENOX) injection 40 mg Start: 10/13/22 2200     Objective:   Vitals:   10/14/22 1940 10/14/22 2204 10/15/22 0519 10/15/22 1353  BP: 120/76 119/69 109/80 130/76  Pulse: 78 76 65 70  Resp: '18 18 16 18  '$ Temp:  98.6 F (37 C) 98 F (36.7 C) 97.9 F (36.6 C)  TempSrc:      SpO2: 98% 96% 98% 100%  Weight:      Height:       Filed Weights   10/13/22 1322 10/13/22 2134  Weight: 54.4 kg 57 kg   Exam: General exam: Appears comfortable  HEENT: oral mucosa moist Respiratory system: Clear to auscultation.  Cardiovascular system: S1 & S2 heard  Gastrointestinal system: Abdomen soft, tender in epigastrium, nondistended. Normal bowel sounds   Extremities: No cyanosis, clubbing or edema Psychiatry:  Mood & affect appropriate.       CBC: Recent Labs  Lab 10/10/22 1226 10/13/22 1400 10/14/22 0938  WBC 13.4* 13.1* 6.3  NEUTROABS 9.5* 9.3* 3.5  HGB 15.9* 16.4*  13.1  HCT 46.5* 47.1* 39.3  MCV 95.5 95.2 97.8  PLT 210 212 893   Basic Metabolic Panel: Recent Labs  Lab 10/10/22 1226 10/13/22 1400 10/13/22 1636 10/13/22 2002 10/14/22 0409 10/15/22 0351  NA 135 140  --  140 139 140  K 4.0 2.6*  --  4.0 4.6 4.0  CL 96* 112*  --  109 112* 108  CO2 27 21*  --  '24 23 25  '$ GLUCOSE 108* 75  --  79 139* 87  BUN 15 9  --  9 8 <5*  CREATININE 0.86 0.53  --  0.75 0.69 0.79  CALCIUM 9.3 6.8*  --  8.2* 8.0* 8.5*  MG 2.2  --  2.2  --   --   --    GFR: Estimated Creatinine Clearance: 63.6 mL/min (by C-G formula based on SCr of 0.79 mg/dL).  Scheduled Meds:  ALPRAZolam  0.5 mg Oral BID   cariprazine  3 mg Oral Daily   enoxaparin (LOVENOX) injection  40 mg Subcutaneous  QHS   famotidine  40 mg Oral BID   fluticasone  1-2 spray Each Nare Daily   metoCLOPramide  10 mg Oral Q6H   nebivolol  10 mg Oral QHS   spironolactone  25 mg Oral QHS   sucralfate  1 g Oral TID WC & HS   Continuous Infusions:   Imaging and lab data was personally reviewed No results found.  LOS: 0 days   Author: Debbe Odea  10/15/2022 2:50 PM  To contact [facility]>   Check the care team in [facility] and look for the attending/consulting [facility] provider listed  Log into [email or web address] and use [facility] universal password   Go to> "[facility]"  and find provider  If you still have difficulty reaching the provider, please page the [facility] (Director on Call) for the Hospitalists listed on amion

## 2022-10-15 NOTE — Progress Notes (Signed)
Patient and spouse are requesting GI consult due to multiple recent ER visits and frequent episodes of nausea and vomiting. Patient states she has an appointment within the next few weeks with GI in office but states she would like to see them while in hospital. Dr. Clearence Ped notified through secure chat of request for consult. No new orders placed at this time.

## 2022-10-16 DIAGNOSIS — R112 Nausea with vomiting, unspecified: Secondary | ICD-10-CM | POA: Diagnosis not present

## 2022-10-16 LAB — CORTISOL-AM, BLOOD: Cortisol - AM: 9.4 ug/dL (ref 6.7–22.6)

## 2022-10-16 NOTE — Progress Notes (Signed)
Subjective: Patient reports she is feeling some better. Noted improvement of burning in stomach with carafate and BID dosing of famotidine. She had an ensure for breakfast that caused some nausea but she was able to keep it down. She did just finish lunch. Feeling okay so far, notes nausea usually occurs about 20-30 minutes after eating. Feels that reglan is helping as well.   Objective: Vital signs in last 24 hours: Temp:  [97.9 F (36.6 C)-98.8 F (37.1 C)] 98.7 F (37.1 C) (01/24 0508) Pulse Rate:  [60-70] 60 (01/24 0508) Resp:  [18] 18 (01/24 0508) BP: (98-130)/(61-76) 100/68 (01/24 0508) SpO2:  [93 %-100 %] 93 % (01/24 0508) Last BM Date : 10/15/22 General:   Alert and oriented, pleasant Head:  Normocephalic and atraumatic. Eyes:  No icterus, sclera clear. Conjuctiva pink.  Mouth:  Without lesions, mucosa pink and moist.   Heart:  S1, S2 present, no murmurs noted.  Lungs: Clear to auscultation bilaterally, without wheezing, rales, or rhonchi.  Abdomen:  Bowel sounds present, soft, notably no TTP of epigastric region (some present yesterday) non-distended. No HSM or hernias noted. No rebound or guarding. No masses appreciated  Msk:  Symmetrical without gross deformities. Normal posture. Pulses:  Normal pulses noted. Extremities:  Without clubbing or edema. Neurologic:  Alert and  oriented x4;  grossly normal neurologically. Skin:  Warm and dry, intact without significant lesions.  Psych:  Alert and cooperative. Normal mood and affect.  Intake/Output from previous day: 01/23 0701 - 01/24 0700 In: 960 [P.O.:960] Out: -  Lab Results: Recent Labs    10/13/22 1400 10/14/22 0938  WBC 13.1* 6.3  HGB 16.4* 13.1  HCT 47.1* 39.3  PLT 212 161   BMET Recent Labs    10/13/22 2002 10/14/22 0409 10/15/22 0351  NA 140 139 140  K 4.0 4.6 4.0  CL 109 112* 108  CO2 '24 23 25  '$ GLUCOSE 79 139* 87  BUN 9 8 <5*  CREATININE 0.75 0.69 0.79  CALCIUM 8.2* 8.0* 8.5*   LFT Recent  Labs    10/13/22 1400 10/15/22 0351  PROT 5.2* 5.6*  ALBUMIN 3.0* 3.3*  AST 12* 15  ALT 12 13  ALKPHOS 33* 35*  BILITOT 0.6 1.2   Assessment: NYILAH KIGHT is a 57 y.o. year old female is a 57 year old female with medical history of asthma, COPD, depression, HTN, TIA, chronic nausea and vomiting who presented to the ED on 1/21 c/o vomiting since 1/16 and syncopal episodes. Notably also seen in the ED on 1/18, sent home with reglan and phenergan. Reportedly unable to keep down liquids which prompted her presentation to the ER. Extensive GI workup in the recent past has been unremarkable. GI consulted for further evaluation.   Chronic nausea/vomiting: ongoing since July 2023, worse over the past week. GI workup with EGD/GES/colonoscopy has been mostly unremarkable for causes of her n/v other than findings of gastritis. She cannot tolerate PPI therapy due to previous allergy to omeprazole, has been on famotidine '40mg'$  daily as outpatient. ongoing burning epigastric discomfort. No etoh or NSAID use. MR brain in April 2023 without any acute findings. Notably on Vraylar which is known to cause n/v, however has been on this for about 2 years, recent dosage increase last May prior to symptoms beginning. While gastritis could certainly be contributing to her symptoms, may need to consider other contributing causes such as medication induced n/v or ENT etiology.  Cortisol levels this morning were normal and TSH also  WNL. She notes some improvement in abdominal burning with BID dosing of famotidine and initiation of carafate. Doing well with reglan and will continue with reglan '5mg'$  TID AC. She has outpatient f/u with our team on 1/30 which she should keep.     Plan: Continue reglan '5mg'$  TID AC  Okay with advancing to soft diet today Pepcid '40mg'$  BID Carafate 1g QID Zofran prn    LOS: 1 day    10/16/2022, 9:51 AM  Dinisha Cai L. Alver Sorrow, MSN, APRN, AGNP-C Adult-Gerontology Nurse Practitioner [facility]  Gastroenterology at [facility]

## 2022-10-16 NOTE — Progress Notes (Signed)
PROGRESS NOTE    [name]  XLK:[id number] DOB: [birth year] DOA: 10/13/2022 PCP: Celene Squibb, MD     Brief Narrative:  [name] is a 57 year old female who has chronic vomiting and GI workup has been unrevealing so far. She also has tobacco abuse, COPD, hypertension and depression. She presented to the ED on 1/18 for vomiting that had started on 1/16 and was discharged home with Reglan.  She presented again to the ED on 1/21 for vomiting and was noted to have a potassium of 2.6, WBC count of 13.1 and a calcium of 6.8 which corrected to 7.6. In the ED, given a dose of Reglan, a dose of Phenergan, fluid boluses, Ca Gluconate, and started by admitted on clear liquids and Zofran PRN.  GI was consulted for persistent symptoms.  New events last 24 hours / Subjective: Patient states that she had a full container of Ensure, followed by nausea.  She did not vomit.  Since finishing her Ensure, she has had persistent nausea all morning.  She does have an appetite and does admits to feelings of hunger.  She wants to advance her diet.  Assessment & Plan:  Principal Problem:   Intractable nausea and vomiting Active Problems:   Hypocalcemia   Essential hypertension   Tobacco abuse counseling   Gastroesophageal reflux disease   Current smoker   Hypokalemia   Bipolar disorder ([place])   Barrett esophagus   Intractable vomiting with nausea   Intractable nausea, vomiting -Has history of GERD, Barrett's esophagus, EGD August 2023 -Appreciate GI consultation -Arman Filter does have side effect list of nausea and vomiting. Her dose was reportedly increased in May 2023. Her symptoms of N/V has been ongoing since July 2023 with largely unremarkable GI work up.  -Continue Reglan, famotidine, carafate  -Advance diet per her request  Hypokalemia -Resolved  Hypertension -Nebivolol, Aldactone  Tobacco abuse -Cessation counseling  Bipolar disorder -Vraylar, Xanax -Psych consulted to evaluate for her  medication reaction for her ongoing symptoms of nausea vomiting   DVT prophylaxis:  enoxaparin (LOVENOX) injection 40 mg Start: 10/13/22 2200  Code Status: Full Family Communication: none at bedside  Disposition Plan:  Status is: Inpatient Remains inpatient appropriate because: continue nausea, advance diet today   Consultants:  GI Psych   Procedures:  None   Antimicrobials:  Anti-infectives (From admission, onward)    None        Objective: Vitals:   10/15/22 1353 10/15/22 2129 10/16/22 0508 10/16/22 1045  BP: 130/76 98/61 100/68 100/70  Pulse: 70 61 60 71  Resp: '18 18 18   '$ Temp: 97.9 F (36.6 C) 98.8 F (37.1 C) 98.7 F (37.1 C)   TempSrc:   Oral   SpO2: 100% 95% 93% 98%  Weight:      Height:        Intake/Output Summary (Last 24 hours) at 10/16/2022 1050 Last data filed at 10/16/2022 0900 Gross per 24 hour  Intake 1200 ml  Output --  Net 1200 ml   Filed Weights   10/13/22 1322 10/13/22 2134  Weight: 54.4 kg 57 kg    Examination:  General exam: Appears calm and comfortable  Respiratory system: Clear to auscultation. Respiratory effort normal. No respiratory distress. No conversational dyspnea.  Cardiovascular system: S1 & S2 heard, RRR. No murmurs. No pedal edema. Gastrointestinal system: Abdomen is nondistended, soft and nontender. Normal bowel sounds heard. Central nervous system: Alert and oriented. No focal neurological deficits. Speech clear.  Extremities: Symmetric in appearance  Skin: No rashes, lesions or ulcers on exposed skin  Psychiatry: Judgement and insight appear normal. Mood & affect appropriate.   Data Reviewed: I have personally reviewed following labs and imaging studies  CBC: Recent Labs  Lab 10/10/22 1226 10/13/22 1400 10/14/22 0938  WBC 13.4* 13.1* 6.3  NEUTROABS 9.5* 9.3* 3.5  HGB 15.9* 16.4* 13.1  HCT 46.5* 47.1* 39.3  MCV 95.5 95.2 97.8  PLT 210 212 425   Basic Metabolic Panel: Recent Labs  Lab 10/10/22 1226  10/13/22 1400 10/13/22 1636 10/13/22 2002 10/14/22 0409 10/15/22 0351  NA 135 140  --  140 139 140  K 4.0 2.6*  --  4.0 4.6 4.0  CL 96* 112*  --  109 112* 108  CO2 27 21*  --  '24 23 25  '$ GLUCOSE 108* 75  --  79 139* 87  BUN 15 9  --  9 8 <5*  CREATININE 0.86 0.53  --  0.75 0.69 0.79  CALCIUM 9.3 6.8*  --  8.2* 8.0* 8.5*  MG 2.2  --  2.2  --   --   --    GFR: Estimated Creatinine Clearance: 63.6 mL/min (by C-G formula based on SCr of 0.79 mg/dL). Liver Function Tests: Recent Labs  Lab 10/10/22 1226 10/13/22 1400 10/15/22 0351  AST 24 12* 15  ALT '22 12 13  '$ ALKPHOS 54 33* 35*  BILITOT 2.1* 0.6 1.2  PROT 7.7 5.2* 5.6*  ALBUMIN 4.7 3.0* 3.3*   Recent Labs  Lab 10/10/22 1226 10/13/22 1400  LIPASE 34 32   No results for input(s): "AMMONIA" in the last 168 hours. Coagulation Profile: No results for input(s): "INR", "PROTIME" in the last 168 hours. Cardiac Enzymes: No results for input(s): "CKTOTAL", "CKMB", "CKMBINDEX", "TROPONINI" in the last 168 hours. BNP (last 3 results) No results for input(s): "PROBNP" in the last 8760 hours. HbA1C: No results for input(s): "HGBA1C" in the last 72 hours. CBG: Recent Labs  Lab 10/13/22 1933  GLUCAP 72   Lipid Profile: No results for input(s): "CHOL", "HDL", "LDLCALC", "TRIG", "CHOLHDL", "LDLDIRECT" in the last 72 hours. Thyroid Function Tests: Recent Labs    10/14/22 0406  TSH 1.106   Anemia Panel: No results for input(s): "VITAMINB12", "FOLATE", "FERRITIN", "TIBC", "IRON", "RETICCTPCT" in the last 72 hours. Sepsis Labs: No results for input(s): "PROCALCITON", "LATICACIDVEN" in the last 168 hours.  Recent Results (from the past 240 hour(s))  Urine Culture     Status: Abnormal   Collection Time: 10/13/22  3:49 PM   Specimen: Urine, Clean Catch  Result Value Ref Range Status   Specimen Description   Final    URINE, CLEAN CATCH Performed at [facility], [address], [place], [place] [address]    Special Requests    Final    NONE Performed at [facility], [address] [place], [place] [address]    Culture MULTIPLE SPECIES PRESENT, SUGGEST RECOLLECTION (A)  Final   Report Status 10/15/2022 FINAL  Final      Radiology Studies: No results found.    Scheduled Meds:  ALPRAZolam  0.5 mg Oral BID   cariprazine  3 mg Oral Daily   enoxaparin (LOVENOX) injection  40 mg Subcutaneous QHS   famotidine  40 mg Oral BID   fluticasone  1-2 spray Each Nare Daily   metoCLOPramide  5 mg Oral TID AC   nebivolol  10 mg Oral QHS   spironolactone  25 mg Oral QHS   sucralfate  1 g Oral  TID WC & HS   Continuous Infusions:   LOS: 1 day   Time spent: 25 minutes   Dessa Phi, DO [facility] 10/16/2022, 10:50 AM   Available via Epic secure chat 7am-7pm After these hours, please refer to coverage provider listed on [email or web address]

## 2022-10-17 DIAGNOSIS — R112 Nausea with vomiting, unspecified: Secondary | ICD-10-CM | POA: Diagnosis not present

## 2022-10-17 MED ORDER — SUCRALFATE 1 G PO TABS: 1.0000 g | ORAL_TABLET | Freq: Four times a day (QID) | ORAL | 1 refills | Status: DC

## 2022-10-17 MED ORDER — METOCLOPRAMIDE HCL 10 MG PO TABS: 10.0000 mg | ORAL_TABLET | Freq: Four times a day (QID) | ORAL | 0 refills | Status: DC

## 2022-10-17 MED ORDER — ONDANSETRON HCL 4 MG PO TABS: 4.0000 mg | ORAL_TABLET | Freq: Four times a day (QID) | ORAL | 0 refills | Status: DC | PRN

## 2022-10-17 NOTE — Progress Notes (Signed)
Patient presented with nausea during the night, PRN antemetic given. Patient rested some after med admin.

## 2022-10-17 NOTE — Discharge Summary (Signed)
Physician Discharge Summary  [name] ZOX:[id number] DOB: 10-27-1965 DOA: 10/13/2022  PCP: Celene Squibb, MD  Admit date: 10/13/2022 Discharge date: 10/17/2022  Admitted From: Home Disposition:  Home   Recommendations for Outpatient Follow-up:  Follow up with PCP as scheduled 10/18/22. Wonder if Arman Filter is playing role in her chronic nausea/vomiting. Medication adjustment per primary.  Follow up with GI as scheduled 10/22/22  Discharge Condition: Stable CODE STATUS: Full  Diet recommendation:  Diet Orders (From admission, onward)     Start     Ordered   10/16/22 1022  DIET SOFT Room service appropriate? Yes; Fluid consistency: Thin  Diet effective now       Question Answer Comment  Room service appropriate? Yes   Fluid consistency: Thin      10/16/22 1021           Brief/Interim Summary: [name] is a 57 year old female who has chronic vomiting and GI workup has been unrevealing so far. She also has tobacco abuse, COPD, hypertension and depression. She presented to the ED on 1/18 for vomiting that had started on 1/16 and was discharged home with Reglan.  She presented again to the ED on 1/21 for vomiting and was noted to have a potassium of 2.6, WBC count of 13.1 and a calcium of 6.8 which corrected to 7.6. In the ED, given a dose of Reglan, a dose of Phenergan, fluid boluses, Ca Gluconate, and started by admitted on clear liquids and Zofran PRN.  GI was consulted for persistent symptoms.  Patient has had unremarkable GI workup for her chronic nausea and vomiting.  She had some improvement on Reglan, famotidine, Carafate.  She was able to tolerate soft diet prior to discharge.  She will follow-up with GI outpatient as well as her primary care physician regarding her medication Vraylar to see if this is one of the medications causing her symptoms.  Discharge Diagnoses:   Principal Problem:   Intractable nausea and vomiting Active Problems:   Hypocalcemia   Essential  hypertension   Tobacco abuse counseling   Gastroesophageal reflux disease   Current smoker   Hypokalemia   Bipolar disorder ([place])   Barrett esophagus   Intractable vomiting with nausea  Intractable nausea, vomiting -Has history of GERD, Barrett's esophagus, EGD August 2023 -Appreciate GI consultation -Arman Filter does have side effect list of nausea and vomiting. Her dose was reportedly increased in May 2023. Her symptoms of N/V has been ongoing since July 2023 with largely unremarkable GI work up.  -Continue Reglan, famotidine, carafate  -Now tolerating diet    Hypertension -Nebivolol, Aldactone   Tobacco abuse -Cessation counseling   Bipolar disorder -Vraylar, Xanax   Discharge Instructions  Discharge Instructions     Call MD for:  difficulty breathing, headache or visual disturbances   Complete by: As directed    Call MD for:  extreme fatigue   Complete by: As directed    Call MD for:  persistant dizziness or light-headedness   Complete by: As directed    Call MD for:  persistant nausea and vomiting   Complete by: As directed    Call MD for:  severe uncontrolled pain   Complete by: As directed    Call MD for:  temperature >100.4   Complete by: As directed    Discharge instructions   Complete by: As directed    You were cared for by a hospitalist during your hospital stay. If you have any questions about your discharge medications  or the care you received while you were in the hospital after you are discharged, you can call the unit and ask to speak with the hospitalist on call if the hospitalist that took care of you is not available. Once you are discharged, your primary care physician will handle any further medical issues. Please note that NO REFILLS for any discharge medications will be authorized once you are discharged, as it is imperative that you return to your primary care physician (or establish a relationship with a primary care physician if you do not have one)  for your aftercare needs so that they can reassess your need for medications and monitor your lab values.   Increase activity slowly   Complete by: As directed       Allergies as of 10/17/2022       Reactions   Bee Venom Anaphylaxis   Motrin [ibuprofen] Other (See Comments)   Avoid due to hiatal hernia    Codeine Nausea And Vomiting, Rash   Penicillins Nausea And Vomiting, Rash   Prilosec [omeprazole] Swelling, Rash   Zantac [ranitidine] Rash        Medication List     STOP taking these medications    promethazine 12.5 MG tablet Commonly known as: PHENERGAN       TAKE these medications    acetaminophen 650 MG CR tablet Commonly known as: TYLENOL Take 1,300 mg by mouth every 8 (eight) hours as needed for pain.   albuterol 108 (90 Base) MCG/ACT inhaler Commonly known as: VENTOLIN HFA Inhale 2 puffs into the lungs every 6 (six) hours as needed for shortness of breath.   ALPRAZolam 0.5 MG tablet Commonly known as: XANAX Take 0.5 mg by mouth 2 (two) times daily.   famotidine 40 MG tablet Commonly known as: Pepcid Take 1 tablet (40 mg total) by mouth 2 (two) times daily.   fluticasone 50 MCG/ACT nasal spray Commonly known as: FLONASE Place 1-2 sprays into both nostrils daily.   metoCLOPramide 10 MG tablet Commonly known as: REGLAN Take 1 tablet (10 mg total) by mouth every 6 (six) hours.   nebivolol 10 MG tablet Commonly known as: BYSTOLIC Take 10 mg by mouth at bedtime.   ondansetron 4 MG tablet Commonly known as: ZOFRAN Take 1 tablet (4 mg total) by mouth every 6 (six) hours as needed for nausea.   spironolactone 25 MG tablet Commonly known as: ALDACTONE Take 1 tablet (25 mg total) by mouth daily. What changed: when to take this   sucralfate 1 g tablet Commonly known as: Carafate Take 1 tablet (1 g total) by mouth 4 (four) times daily.   Vraylar 3 MG capsule Generic drug: cariprazine Take 3 mg by mouth daily.         Allergies  Allergen  Reactions   Bee Venom Anaphylaxis   Motrin [Ibuprofen] Other (See Comments)    Avoid due to hiatal hernia    Codeine Nausea And Vomiting and Rash   Penicillins Nausea And Vomiting and Rash   Prilosec [Omeprazole] Swelling and Rash   Zantac [Ranitidine] Rash    Consultations: GI   Procedures/Studies: No results found.    Discharge Exam: Vitals:   10/16/22 2141 10/17/22 0531  BP: 113/71 103/63  Pulse: 73 (!) 56  Resp: 18 18  Temp: 98.1 F (36.7 C) 98.5 F (36.9 C)  SpO2: 96% 98%    General: Pt is alert, awake, not in acute distress Cardiovascular: RRR, S1/S2 +, no edema Respiratory: CTA bilaterally, no  wheezing, no rhonchi, no respiratory distress, no conversational dyspnea  Abdominal: Soft, NT, ND, bowel sounds + Extremities: no edema, no cyanosis Psych: Normal mood and affect, stable judgement and insight     The results of significant diagnostics from this hospitalization (including imaging, microbiology, ancillary and laboratory) are listed below for reference.     Microbiology: Recent Results (from the past 240 hour(s))  Urine Culture     Status: Abnormal   Collection Time: 10/13/22  3:49 PM   Specimen: Urine, Clean Catch  Result Value Ref Range Status   Specimen Description   Final    URINE, CLEAN CATCH Performed at [facility], [address], [place], [place] [address]    Special Requests   Final    NONE Performed at [facility], [address], [place],  [address]    Culture MULTIPLE SPECIES PRESENT, SUGGEST RECOLLECTION (A)  Final   Report Status 10/15/2022 FINAL  Final     Labs: BNP (last 3 results) No results for input(s): "BNP" in the last 8760 hours. Basic Metabolic Panel: Recent Labs  Lab 10/10/22 1226 10/13/22 1400 10/13/22 1636 10/13/22 2002 10/14/22 0409 10/15/22 0351  NA 135 140  --  140 139 140  K 4.0 2.6*  --  4.0 4.6 4.0  CL 96* 112*  --  109 112* 108  CO2 27 21*  --  '24 23 25  '$ GLUCOSE 108* 75  --  79 139* 87  BUN  15 9  --  9 8 <5*  CREATININE 0.86 0.53  --  0.75 0.69 0.79  CALCIUM 9.3 6.8*  --  8.2* 8.0* 8.5*  MG 2.2  --  2.2  --   --   --    Liver Function Tests: Recent Labs  Lab 10/10/22 1226 10/13/22 1400 10/15/22 0351  AST 24 12* 15  ALT '22 12 13  '$ ALKPHOS 54 33* 35*  BILITOT 2.1* 0.6 1.2  PROT 7.7 5.2* 5.6*  ALBUMIN 4.7 3.0* 3.3*   Recent Labs  Lab 10/10/22 1226 10/13/22 1400  LIPASE 34 32   No results for input(s): "AMMONIA" in the last 168 hours. CBC: Recent Labs  Lab 10/10/22 1226 10/13/22 1400 10/14/22 0938  WBC 13.4* 13.1* 6.3  NEUTROABS 9.5* 9.3* 3.5  HGB 15.9* 16.4* 13.1  HCT 46.5* 47.1* 39.3  MCV 95.5 95.2 97.8  PLT 210 212 161   Cardiac Enzymes: No results for input(s): "CKTOTAL", "CKMB", "CKMBINDEX", "TROPONINI" in the last 168 hours. BNP: Invalid input(s): "POCBNP" CBG: Recent Labs  Lab 10/13/22 1933  GLUCAP 72   D-Dimer No results for input(s): "DDIMER" in the last 72 hours. Hgb A1c No results for input(s): "HGBA1C" in the last 72 hours. Lipid Profile No results for input(s): "CHOL", "HDL", "LDLCALC", "TRIG", "CHOLHDL", "LDLDIRECT" in the last 72 hours. Thyroid function studies No results for input(s): "TSH", "T4TOTAL", "T3FREE", "THYROIDAB" in the last 72 hours.  Invalid input(s): "FREET3" Anemia work up No results for input(s): "VITAMINB12", "FOLATE", "FERRITIN", "TIBC", "IRON", "RETICCTPCT" in the last 72 hours. Urinalysis    Component Value Date/Time   COLORURINE STRAW (A) 10/13/2022 1400   APPEARANCEUR CLEAR 10/13/2022 1400   LABSPEC 1.008 10/13/2022 1400   PHURINE 6.0 10/13/2022 1400   GLUCOSEU NEGATIVE 10/13/2022 1400   HGBUR NEGATIVE 10/13/2022 1400   BILIRUBINUR NEGATIVE 10/13/2022 1400   KETONESUR 20 (A) 10/13/2022 1400   PROTEINUR NEGATIVE 10/13/2022 1400   NITRITE NEGATIVE 10/13/2022 1400   LEUKOCYTESUR LARGE (A) 10/13/2022 1400   Sepsis Labs  Recent Labs  Lab 10/10/22 1226 10/13/22 1400 10/14/22 0938  WBC 13.4* 13.1*  6.3   Microbiology Recent Results (from the past 240 hour(s))  Urine Culture     Status: Abnormal   Collection Time: 10/13/22  3:49 PM   Specimen: Urine, Clean Catch  Result Value Ref Range Status   Specimen Description   Final    URINE, CLEAN CATCH Performed at [facility], [address], [place], [place] [address]    Special Requests   Final    NONE Performed at [facility], [address] [place], [place] [address]    Culture MULTIPLE SPECIES PRESENT, SUGGEST RECOLLECTION (A)  Final   Report Status 10/15/2022 FINAL  Final     Patient was seen and examined on the day of discharge and was found to be in stable condition. Time coordinating discharge: 25 minutes including assessment and coordination of care, as well as examination of the patient.   SIGNED:  Dessa Phi, DO [facility] 10/17/2022, 10:57 AM

## 2022-10-17 NOTE — Progress Notes (Signed)
Gastroenterology Progress Note   Referring Provider: No ref. provider found Primary Care Physician:  Celene Squibb, MD Primary Gastroenterologist:  Dr. Abbey Chatters  Patient ID: [name]; [id number]; 10-Jan-1966   Subjective:    Doing ok. She hopes to go home. She did well with supper. Was not hungry this morning for breakfast, ate bites. No side effects noted from reglan.   Objective:   Vital signs in last 24 hours: Temp:  [98.1 F (36.7 C)-98.5 F (36.9 C)] 98.5 F (36.9 C) (01/25 0531) Pulse Rate:  [56-73] 56 (01/25 0531) Resp:  [18] 18 (01/25 0531) BP: (100-113)/(63-71) 103/63 (01/25 0531) SpO2:  [96 %-98 %] 98 % (01/25 0531) Last BM Date : 10/15/22 General:   Alert,  Well-developed, well-nourished, pleasant and cooperative in NAD Head:  Normocephalic and atraumatic. Eyes:  Sclera clear, no icterus.  Extremities:  Without edema. Neurologic:  Alert and  oriented x4;  grossly normal neurologically. Skin:  Intact without significant lesions or rashes. Psych:  Alert and cooperative. Normal mood and affect.  Intake/Output from previous day: 01/24 0701 - 01/25 0700 In: 960 [P.O.:960] Out: -  Intake/Output this shift: No intake/output data recorded.  Lab Results: CBC Recent Labs    10/14/22 0938  WBC 6.3  HGB 13.1  HCT 39.3  MCV 97.8  PLT 161   BMET Recent Labs    10/15/22 0351  NA 140  K 4.0  CL 108  CO2 25  GLUCOSE 87  BUN <5*  CREATININE 0.79  CALCIUM 8.5*   LFTs Recent Labs    10/15/22 0351  BILITOT 1.2  ALKPHOS 35*  AST 15  ALT 13  PROT 5.6*  ALBUMIN 3.3*   No results for input(s): "LIPASE" in the last 72 hours. PT/INR No results for input(s): "LABPROT", "INR" in the last 72 hours.       Imaging Studies: No results found.[2 weeks]  Assessment:   57 y.o. year old female is a 57 year old female with medical history of asthma, COPD, depression, HTN, TIA, chronic nausea and vomiting who presented to the ED on 1/21 c/o vomiting since  1/16 and syncopal episodes. Notably also seen in the ED on 1/18, sent home with reglan and phenergan. Reportedly unable to keep down liquids which prompted her presentation to the ER. Extensive GI workup in the recent past has been unremarkable. GI consulted for further evaluation.    Chronic nausea/vomiting: ongoing since July 2023, worse over the past week. GI workup with EGD/GES/colonoscopy has been mostly unremarkable for causes of her n/v other than findings of gastritis. She cannot tolerate PPI therapy due to previous allergy to omeprazole, has been on famotidine '40mg'$  daily as outpatient. ongoing burning epigastric discomfort. No etoh or NSAID use. MR brain in April 2023 without any acute findings. Notably on Vraylar which is known to cause n/v, however has been on this for about 2 years, recent dosage increase last May prior to symptoms beginning. While gastritis could certainly be contributing to her symptoms, likely does not explain the severity of her symptoms. Wonder if increased dose of Arman Filter is also playing a role. Cortisol levels this morning were normal and TSH also WNL. She notes some improvement in abdominal burning with BID dosing of famotidine and initiation of carafate. Doing well with reglan and will continue with reglan '5mg'$  TID AC. She has outpatient f/u with our team on 1/30 which she should keep.     Plan:   Continue reglan '5mg'$  qac as  outpatient. She is aware to monitor for side effects, specifically tardive dyskinesia.  She has reglan '10mg'$  tablets at home given by EDP, she can take 1/2 tablet qac until complete.  Continue famotidine and carafate at discharge.  Keep upcoming ov on 10/22/22.    LOS: 2 days   Laureen Ochs. Bernarda Caffey [facility] [phone] 1/25/20248:01 AM

## 2022-10-18 DIAGNOSIS — R1030 Lower abdominal pain, unspecified: Secondary | ICD-10-CM | POA: Diagnosis not present

## 2022-10-18 DIAGNOSIS — H748X9 Other specified disorders of middle ear and mastoid, unspecified ear: Secondary | ICD-10-CM | POA: Diagnosis not present

## 2022-10-18 DIAGNOSIS — R42 Dizziness and giddiness: Secondary | ICD-10-CM | POA: Diagnosis not present

## 2022-10-18 DIAGNOSIS — H748X3 Other specified disorders of middle ear and mastoid, bilateral: Secondary | ICD-10-CM | POA: Diagnosis not present

## 2022-10-18 DIAGNOSIS — E876 Hypokalemia: Secondary | ICD-10-CM | POA: Diagnosis not present

## 2022-10-18 DIAGNOSIS — R112 Nausea with vomiting, unspecified: Secondary | ICD-10-CM | POA: Diagnosis not present

## 2022-10-20 ENCOUNTER — Other Ambulatory Visit: Payer: Self-pay

## 2022-10-20 ENCOUNTER — Emergency Department (HOSPITAL_COMMUNITY)
Admission: EM | Admit: 2022-10-20 | Discharge: 2022-10-20 | Disposition: A | Discharge status: Home / Self Care | Payer: BC Managed Care – PPO | Attending: Emergency Medicine | Admitting: Emergency Medicine

## 2022-10-20 ENCOUNTER — Emergency Department (HOSPITAL_COMMUNITY): Payer: BC Managed Care – PPO

## 2022-10-20 DIAGNOSIS — K5792 Diverticulitis of intestine, part unspecified, without perforation or abscess without bleeding: Secondary | ICD-10-CM | POA: Insufficient documentation

## 2022-10-20 DIAGNOSIS — R55 Syncope and collapse: Secondary | ICD-10-CM

## 2022-10-20 DIAGNOSIS — J45901 Unspecified asthma with (acute) exacerbation: Secondary | ICD-10-CM | POA: Diagnosis not present

## 2022-10-20 DIAGNOSIS — R42 Dizziness and giddiness: Secondary | ICD-10-CM | POA: Diagnosis not present

## 2022-10-20 DIAGNOSIS — Z8673 Personal history of transient ischemic attack (TIA), and cerebral infarction without residual deficits: Secondary | ICD-10-CM | POA: Diagnosis not present

## 2022-10-20 DIAGNOSIS — K5732 Diverticulitis of large intestine without perforation or abscess without bleeding: Secondary | ICD-10-CM | POA: Diagnosis not present

## 2022-10-20 DIAGNOSIS — N281 Cyst of kidney, acquired: Secondary | ICD-10-CM | POA: Diagnosis not present

## 2022-10-20 DIAGNOSIS — J449 Chronic obstructive pulmonary disease, unspecified: Secondary | ICD-10-CM | POA: Insufficient documentation

## 2022-10-20 DIAGNOSIS — I10 Essential (primary) hypertension: Secondary | ICD-10-CM | POA: Insufficient documentation

## 2022-10-20 DIAGNOSIS — K7689 Other specified diseases of liver: Secondary | ICD-10-CM | POA: Diagnosis not present

## 2022-10-20 DIAGNOSIS — K5730 Diverticulosis of large intestine without perforation or abscess without bleeding: Secondary | ICD-10-CM | POA: Diagnosis not present

## 2022-10-20 LAB — URINALYSIS, ROUTINE W REFLEX MICROSCOPIC
Bacteria, UA: NONE SEEN
Bilirubin Urine: NEGATIVE
Glucose, UA: NEGATIVE mg/dL
Hgb urine dipstick: NEGATIVE
Ketones, ur: NEGATIVE mg/dL
Nitrite: NEGATIVE
Protein, ur: NEGATIVE mg/dL
Specific Gravity, Urine: 1.003 — ABNORMAL LOW (ref 1.005–1.030)
WBC, UA: >50 WBC/hpf (ref 0–5)
pH: 6 (ref 5.0–8.0)

## 2022-10-20 LAB — CBC
HCT: 42.5 % (ref 36.0–46.0)
Hemoglobin: 14.6 g/dL (ref 12.0–15.0)
MCH: 33 pg (ref 26.0–34.0)
MCHC: 34.4 g/dL (ref 30.0–36.0)
MCV: 96.2 fL (ref 80.0–100.0)
Platelets: 185 10*3/uL (ref 150–400)
RBC: 4.42 MIL/uL (ref 3.87–5.11)
RDW: 12.8 % (ref 11.5–15.5)
WBC: 10.6 10*3/uL — ABNORMAL HIGH (ref 4.0–10.5)
nRBC: 0 % (ref 0.0–0.2)

## 2022-10-20 LAB — BASIC METABOLIC PANEL
Anion gap: 11 (ref 5–15)
BUN: 11 mg/dL (ref 6–20)
CO2: 27 mmol/L (ref 22–32)
Calcium: 9.1 mg/dL (ref 8.9–10.3)
Chloride: 101 mmol/L (ref 98–111)
Creatinine, Ser: 0.79 mg/dL (ref 0.44–1.00)
GFR, Estimated: >60 mL/min (ref >60)
Glucose, Bld: 100 mg/dL — ABNORMAL HIGH (ref 70–99)
Potassium: 3.5 mmol/L (ref 3.5–5.1)
Sodium: 139 mmol/L (ref 135–145)

## 2022-10-20 LAB — CBG MONITORING, ED: Glucose-Capillary: 85 mg/dL (ref 70–99)

## 2022-10-20 LAB — MAGNESIUM: Magnesium: 2 mg/dL (ref 1.7–2.4)

## 2022-10-20 MED ORDER — METRONIDAZOLE 500 MG PO TABS
500.0000 mg | ORAL_TABLET | Freq: Once | ORAL | Status: AC
  Administered 2022-10-20: 500 mg via ORAL
  Filled 2022-10-20: qty 1

## 2022-10-20 MED ORDER — ONDANSETRON HCL 4 MG/5ML PO SOLN: 4.0000 mg | Freq: Once | ORAL | Status: DC

## 2022-10-20 MED ORDER — IPRATROPIUM-ALBUTEROL 0.5-2.5 (3) MG/3ML IN SOLN
3.0000 mL | Freq: Once | RESPIRATORY_TRACT | Status: AC
  Administered 2022-10-20: 3 mL via RESPIRATORY_TRACT
  Filled 2022-10-20: qty 3

## 2022-10-20 MED ORDER — IOHEXOL 300 MG/ML  SOLN
100.0000 mL | Freq: Once | INTRAMUSCULAR | Status: AC | PRN
  Administered 2022-10-20: 100 mL via INTRAVENOUS

## 2022-10-20 MED ORDER — DEXAMETHASONE SODIUM PHOSPHATE 10 MG/ML IJ SOLN
10.0000 mg | Freq: Once | INTRAMUSCULAR | Status: AC
  Administered 2022-10-20: 10 mg via INTRAVENOUS
  Filled 2022-10-20: qty 1

## 2022-10-20 MED ORDER — CIPROFLOXACIN HCL 250 MG PO TABS
500.0000 mg | ORAL_TABLET | Freq: Once | ORAL | Status: AC
  Administered 2022-10-20: 500 mg via ORAL
  Filled 2022-10-20: qty 2

## 2022-10-20 MED ORDER — ONDANSETRON HCL 4 MG/2ML IJ SOLN
4.0000 mg | Freq: Once | INTRAMUSCULAR | Status: AC
  Administered 2022-10-20: 4 mg via INTRAVENOUS
  Filled 2022-10-20: qty 2

## 2022-10-20 MED ORDER — POTASSIUM CHLORIDE CRYS ER 20 MEQ PO TBCR
20.0000 meq | EXTENDED_RELEASE_TABLET | Freq: Once | ORAL | Status: AC
  Administered 2022-10-20: 20 meq via ORAL
  Filled 2022-10-20: qty 1

## 2022-10-20 MED ORDER — CIPROFLOXACIN HCL 500 MG PO TABS: 500.0000 mg | ORAL_TABLET | Freq: Two times a day (BID) | ORAL | 0 refills | Status: DC

## 2022-10-20 MED ORDER — METRONIDAZOLE 500 MG PO TABS: 500.0000 mg | ORAL_TABLET | Freq: Three times a day (TID) | ORAL | 0 refills | Status: DC

## 2022-10-20 MED ORDER — SODIUM CHLORIDE 0.9 % IV BOLUS
1000.0000 mL | Freq: Once | INTRAVENOUS | Status: AC
  Administered 2022-10-20: 1000 mL via INTRAVENOUS

## 2022-10-20 NOTE — Discharge Instructions (Addendum)
Return to ED with any new or worsening signs or symptoms Please read attached guides concerning diverticulitis, near syncope Please pick up prescription medication.  You will take metronidazole 3 times daily for the neck 7 days.  You will take ciprofloxacin 2 times daily for the next 7 days. Please follow-up with GI on Tuesday as we discussed Please follow-up with PCP concerning lab test done at last appointment

## 2022-10-20 NOTE — ED Provider Notes (Addendum)
[facility] Provider Note   CSN: [id number] Arrival date & time: 10/20/22  1841     History  Chief Complaint  Patient presents with   Dizziness    [name] is a 57 y.o. female with medical history of TIA, asthma, COPD, hypertension, PONV, chronic nausea and vomiting.  Patient presents to ED for evaluation of near syncopal event.  Patient reports that today she had 4 episodes where she became very hot, lightheaded and began seeing stars.  Patient reports that at this time she felt she was going to pass out but did not.  Patient reports history of this in the past.  Patient husband at bedside reports that patient has not been eating or drinking much since being discharged in the hospital on Thursday.  Patient was complaining of 2 days of abdominal pain that began after being discharged in the hospital.  Patient reports abdominal pain is located diffusely throughout, nonfocal.  Patient denies fevers, nausea, vomiting, diarrhea, blood in stool, chest pain, shortness of breath.  Patient denies one-sided weakness or numbness.  Patient reports history of asthma, believes she is currently wheezing.   Dizziness Associated symptoms: no blood in stool, no chest pain, no diarrhea, no nausea, no shortness of breath and no vomiting        Home Medications Prior to Admission medications   Medication Sig Start Date End Date Taking? Authorizing Provider  ciprofloxacin (CIPRO) 500 MG tablet Take 1 tablet (500 mg total) by mouth every 12 (twelve) hours. 10/20/22  Yes Azucena Cecil, PA-C  metroNIDAZOLE (FLAGYL) 500 MG tablet Take 1 tablet (500 mg total) by mouth 3 (three) times daily. 10/20/22  Yes Azucena Cecil, PA-C  acetaminophen (TYLENOL) 650 MG CR tablet Take 1,300 mg by mouth every 8 (eight) hours as needed for pain.    [provider]  albuterol (PROVENTIL HFA;VENTOLIN HFA) 108 (90 BASE) MCG/ACT inhaler Inhale 2 puffs into the  lungs every 6 (six) hours as needed for shortness of breath.     [provider]  ALPRAZolam Duanne Moron) 0.5 MG tablet Take 0.5 mg by mouth 2 (two) times daily. 04/13/21   [provider]  famotidine (PEPCID) 40 MG tablet Take 1 tablet (40 mg total) by mouth 2 (two) times daily. 05/02/22   Erenest Rasher, PA-C  fluticasone (FLONASE) 50 MCG/ACT nasal spray Place 1-2 sprays into both nostrils daily.    [provider]  metoCLOPramide (REGLAN) 10 MG tablet Take 1 tablet (10 mg total) by mouth every 6 (six) hours. 10/17/22   Dessa Phi, DO  nebivolol (BYSTOLIC) 10 MG tablet Take 10 mg by mouth at bedtime.    [provider]  ondansetron (ZOFRAN) 4 MG tablet Take 1 tablet (4 mg total) by mouth every 6 (six) hours as needed for nausea. 10/17/22   Dessa Phi, DO  spironolactone (ALDACTONE) 25 MG tablet Take 1 tablet (25 mg total) by mouth daily. Patient taking differently: Take 25 mg by mouth at bedtime. 02/05/22 01/31/23  Buford Dresser, MD  sucralfate (CARAFATE) 1 g tablet Take 1 tablet (1 g total) by mouth 4 (four) times daily. 10/17/22 12/16/22  Dessa Phi, DO  VRAYLAR 3 MG capsule Take 3 mg by mouth daily. 01/23/22   [provider]      Allergies    Bee venom, Motrin [ibuprofen], Codeine, Penicillins, Prilosec [omeprazole], and Zantac [ranitidine]    Review of Systems   Review of Systems  Constitutional:  Negative for fever.  Respiratory:  Positive for wheezing. Negative for shortness of breath.   Cardiovascular:  Negative for chest pain.  Gastrointestinal:  Positive for abdominal pain. Negative for blood in stool, diarrhea, nausea and vomiting.  Neurological:  Positive for dizziness and light-headedness.    Physical Exam Updated Vital Signs BP 131/82   Pulse 81   Temp 98 F (36.7 C) (Oral)   Resp 19   Ht '5\' 2"'$  (1.575 m)   Wt 53.1 kg   SpO2 100%   BMI 21.40 kg/m  Physical Exam Constitutional:      General: She is not in acute  distress.    Appearance: Normal appearance. She is not ill-appearing, toxic-appearing or diaphoretic.  HENT:     Head: Normocephalic and atraumatic.     Mouth/Throat:     Mouth: Mucous membranes are moist.     Pharynx: Oropharynx is clear.  Eyes:     Extraocular Movements: Extraocular movements intact.     Conjunctiva/sclera: Conjunctivae normal.     Pupils: Pupils are equal, round, and reactive to light.  Cardiovascular:     Rate and Rhythm: Normal rate and regular rhythm.  Pulmonary:     Effort: Pulmonary effort is normal.     Breath sounds: Wheezing present.  Abdominal:     General: Abdomen is flat.     Palpations: Abdomen is soft.     Tenderness: There is abdominal tenderness.  Musculoskeletal:     Cervical back: Normal range of motion and neck supple. No tenderness.  Skin:    General: Skin is warm and dry.     Capillary Refill: Capillary refill takes less than 2 seconds.  Neurological:     General: No focal deficit present.     Mental Status: She is alert and oriented to person, place, and time.     GCS: GCS eye subscore is 4. GCS verbal subscore is 5. GCS motor subscore is 6.     Cranial Nerves: Cranial nerves 2-12 are intact. No cranial nerve deficit.     Sensory: Sensation is intact. No sensory deficit.     Motor: Motor function is intact. No weakness.     Coordination: Coordination is intact. Heel to Shin Test normal.     ED Results / Procedures / Treatments   Labs (all labs ordered are listed, but only abnormal results are displayed) Labs Reviewed  BASIC METABOLIC PANEL - Abnormal; Notable for the following components:      Result Value   Glucose, Bld 100 (*)    All other components within normal limits  CBC - Abnormal; Notable for the following components:   WBC 10.6 (*)    All other components within normal limits  URINALYSIS, ROUTINE W REFLEX MICROSCOPIC - Abnormal; Notable for the following components:   Color, Urine STRAW (*)    Specific Gravity, Urine  1.003 (*)    Leukocytes,Ua LARGE (*)    All other components within normal limits  MAGNESIUM  CBG MONITORING, ED    EKG EKG Interpretation  Date/Time:  Sunday October 20 2022 18:50:50 EST Ventricular Rate:  85 PR Interval:  136 QRS Duration: 70 QT Interval:  358 QTC Calculation: 426 R Axis:   79 Text Interpretation: Normal sinus rhythm Normal ECG When compared with ECG of 13-Oct-2022 15:43, PREVIOUS ECG IS PRESENT Confirmed by Fredia Sorrow [phone]) on 10/20/2022 8:26:31 PM  Radiology CT ABDOMEN PELVIS W CONTRAST  Result Date: 10/20/2022 CLINICAL DATA:  Abdominal pain EXAM: CT ABDOMEN AND  PELVIS WITH CONTRAST TECHNIQUE: Multidetector CT imaging of the abdomen and pelvis was performed using the standard protocol following bolus administration of intravenous contrast. RADIATION DOSE REDUCTION: This exam was performed according to the departmental dose-optimization program which includes automated exposure control, adjustment of the mA and/or kV according to patient size and/or use of iterative reconstruction technique. CONTRAST:  141m OMNIPAQUE IOHEXOL 300 MG/ML  SOLN COMPARISON:  MRI abdomen May 17, 2022 and CT abdomen pelvis April 11, 2022. FINDINGS: Lower chest: No acute abnormality. Hepatobiliary: Oblong lesion in the hepatic dome on image 7/2 demonstrate nonspecific imaging features on MRI May 17, 2022 but has been present dating back to 2006 compatible with a benign etiology. Gallbladder surgically absent. Similar prominence of the intrahepatic biliary tree with normal caliber common duct measuring 5 cm. Pancreas: No pancreatic ductal dilation or evidence of acute inflammation. Spleen: No splenomegaly. Adrenals/Urinary Tract: Bilateral adrenal glands appear normal. No hydronephrosis. Left cortical renal scarring. Wedge-shaped hypoenhancing areas in the left lower pole kidney for instance on image 21/2 and 23/2 with overlying cortical scarring. Bilateral hypodense renal lesions are  technically too small to accurately characterize but statistically likely to reflect cysts and considered benign requiring no independent imaging follow-up. Urinary bladder is unremarkable for degree of distension. Stomach/Bowel: Tiny hiatal hernia. No pathologic dilation of small or large bowel. Normal appendix moderate volume of formed stool throughout the colon. Sigmoid colonic diverticulosis with inflammatory stranding along the sigmoid colon compatible with sigmoid colonic diverticulitis/colitis. Vascular/Lymphatic: Aortic atherosclerosis. Normal caliber abdominal aorta. Smooth IVC contours. No pathologically enlarged abdominal or pelvic lymph nodes. Reproductive: Status post hysterectomy. No adnexal masses. Other: No walled off fluid collections.  No pneumoperitoneum. Musculoskeletal: No acute osseous abnormality. IMPRESSION: 1. Acute uncomplicated sigmoid colonic diverticulitis/colitis. 2. Wedge-shaped hypoenhancing areas in the left lower pole kidney with overlying cortical scarring, suggest correlation with laboratory values for pyelonephritis. 3.  Aortic Atherosclerosis (ICD10-I70.0). Electronically Signed   By: JDahlia BailiffM.D.   On: 10/20/2022 21:32    Procedures Procedures    Medications Ordered in ED Medications  sodium chloride 0.9 % bolus 1,000 mL (1,000 mLs Intravenous New Bag/Given 10/20/22 2039)  potassium chloride SA (KLOR-CON M) CR tablet 20 mEq (20 mEq Oral Given 10/20/22 2039)  ipratropium-albuterol (DUONEB) 0.5-2.5 (3) MG/3ML nebulizer solution 3 mL (3 mLs Nebulization Given 10/20/22 2055)  dexamethasone (DECADRON) injection 10 mg (10 mg Intravenous Given 10/20/22 2038)  ondansetron (ZOFRAN) injection 4 mg (4 mg Intravenous Given 10/20/22 2038)  iohexol (OMNIPAQUE) 300 MG/ML solution 100 mL (100 mLs Intravenous Contrast Given 10/20/22 2113)  ciprofloxacin (CIPRO) tablet 500 mg (500 mg Oral Given 10/20/22 2220)  metroNIDAZOLE (FLAGYL) tablet 500 mg (500 mg Oral Given 10/20/22 2220)     ED Course/ Medical Decision Making/ A&P                          Medical Decision Making Amount and/or Complexity of Data Reviewed Labs: ordered. Radiology: ordered.  Risk Prescription drug management.   57year old female presents to the ED for evaluation.  Please see HPI for further details.  On examination patient is afebrile and nontachycardic.  Patient lung sounds have wheezing throughout, not hypoxic here.  Patient abdomen has tenderness throughout.  No overlying skin change patient abdomen.  Patient neurological examination shows no focal neurodeficits.  Patient CBC with leukocytosis 10.6.  Patient BMP unremarkable.  Patient magnesium stable, within normal limits.  Patient urinalysis shows large leukocytes however patient denies dysuria or  flank pain, we will culture.  Patient CT abdomen pelvis shows uncomplicated diverticulitis.  Patient allergic to penicillin so patient was sent home on ciprofloxacin, Flagyl.  Patient provided initial doses here tonight in the emergency room.  Patient also noted to have wheezing on examination so patient provided 10 mg Decadron, DuoNeb treatment.  Patient lung sounds cleared after initial DuoNeb.  At this time, patient states she feels better.  Patient reports after receiving IV fluids her lightheadedness has resolved.  Patient will be discharged home and advised to follow-up with GI.  Patient reports she has appointment on Tuesday.  Patient discharged in stable condition.  Patient provided return precautions and she voiced understanding.  Patient had all her questions answered to her satisfaction.  Patient stable for discharge.   Final Clinical Impression(s) / ED Diagnoses Final diagnoses:  Near syncope  Diverticulitis  Exacerbation of asthma, unspecified asthma severity, unspecified whether persistent    Rx / DC Orders ED Discharge Orders          Ordered    metroNIDAZOLE (FLAGYL) 500 MG tablet  3 times daily        10/20/22 2217     ciprofloxacin (CIPRO) 500 MG tablet  Every 12 hours        10/20/22 2217               Azucena Cecil, PA-C 10/20/22 2241    Fredia Sorrow, MD 10/20/22 [phone]

## 2022-10-20 NOTE — ED Triage Notes (Signed)
Pt c/o dizziness and about to pass out. Pt began crying upon entering triage room. Stating she was sitting at home and began seeing stars and felt hot and felt as if she was going to pass out but did not . Pt states she has had these episodes x 4 today.

## 2022-10-21 ENCOUNTER — Telehealth (HOSPITAL_BASED_OUTPATIENT_CLINIC_OR_DEPARTMENT_OTHER): Payer: Self-pay | Admitting: Cardiology

## 2022-10-21 LAB — URINALYSIS, W/ REFLEX TO CULTURE (INFECTION SUSPECTED)

## 2022-10-21 NOTE — Telephone Encounter (Signed)
Left message for patient regarding new appointment date and time--(01/21/23 4:00 pm appt cancelled--Dr. Harrell Gave not in the office)  Wednesday 01/22/23 at 4:00 pm---requested return confirmation call and will mail information to pateint

## 2022-10-22 ENCOUNTER — Ambulatory Visit: Payer: BC Managed Care – PPO | Admitting: Gastroenterology

## 2022-10-22 ENCOUNTER — Ambulatory Visit (INDEPENDENT_AMBULATORY_CARE_PROVIDER_SITE_OTHER): Payer: BC Managed Care – PPO | Admitting: Gastroenterology

## 2022-10-22 ENCOUNTER — Encounter: Payer: Self-pay | Admitting: Gastroenterology

## 2022-10-22 VITALS — BP 104/71 | HR 80 | Temp 97.9°F | Ht 62.0 in | Wt 122.0 lb

## 2022-10-22 DIAGNOSIS — K2100 Gastro-esophageal reflux disease with esophagitis, without bleeding: Secondary | ICD-10-CM

## 2022-10-22 DIAGNOSIS — R112 Nausea with vomiting, unspecified: Secondary | ICD-10-CM | POA: Diagnosis not present

## 2022-10-22 DIAGNOSIS — K5732 Diverticulitis of large intestine without perforation or abscess without bleeding: Secondary | ICD-10-CM | POA: Insufficient documentation

## 2022-10-22 DIAGNOSIS — K2270 Barrett's esophagus without dysplasia: Secondary | ICD-10-CM

## 2022-10-22 DIAGNOSIS — F17290 Nicotine dependence, other tobacco product, uncomplicated: Secondary | ICD-10-CM | POA: Insufficient documentation

## 2022-10-22 DIAGNOSIS — K5900 Constipation, unspecified: Secondary | ICD-10-CM

## 2022-10-22 LAB — URINE CULTURE

## 2022-10-22 MED ORDER — LINACLOTIDE 72 MCG PO CAPS: 72.0000 ug | ORAL_CAPSULE | Freq: Every day | ORAL | 3 refills | Status: DC

## 2022-10-22 NOTE — Patient Instructions (Signed)
I will review your records regarding possible need for Chest CT. We will be in touch in next couple of days. Complete cipro and flagyl, take with food. Call if your abdominal pain does not improve.  Start Linzess 69mg once daily as needed for constipation.  Continue reglan and famotidine as before.  Eat soft low fiber diet until your abdominal pain is feeling better.

## 2022-10-22 NOTE — Progress Notes (Signed)
GI Office Note    Referring Provider: Celene Squibb, MD Primary Care Physician:  Celene Squibb, MD  Primary Gastroenterologist: Elon Alas. Abbey Chatters, DO   Chief Complaint   Chief Complaint  Patient presents with   Abdominal Pain    Having issues with vomiting, constipation, abdominal pain and weight loss.     History of Present Illness   [name] is a 57 y.o. female presenting today for follow up. Recent hospitalization, discharged last week. Seen in ED 10/20/22 for near syncopal episodes.   History of chronic GERD, recurrent nausea/vomiting, intermittent abdominal pain since July 2023.  Associated with weight loss.  EGD August 2023 with 3 cm length Barrett's esophagus, biopsy confirmative.  Gastritis, negative for H. pylori.  Numerous allergies to PPI, currently on famotidine 40 mg twice daily.  CT abdomen pelvis with contrast October 20, 2022 with suggestion of acute uncomplicated sigmoid colon diverticulitis versus colitis.  Patient started on Cipro and Flagyl.   Weighed 157 12/2021, down to 122 pounds today.     GES: 04/2022 normal  Last EGD:8/23 - 3 cm hiatal hernia. - Esophageal mucosal changes consistent with short-segment Barrett's esophagus. Biopsied. - Mild Schatzki ring. Dilated. - Gastritis. Biopsied. No h.pylori - Normal duodenal bulb, first portion of the duodenum and second portion of the duodenum. Last Colonoscopy: 10/23  - Non-bleeding internal hemorrhoids.  - Diverticulosis in the sigmoid colon. - One 5 mm polyp in the descending colon, removed with a cold snare. Resected and retrieved. - The examination was otherwise normal. -tubular adenomas -next colonoscopy five years  Today: has been sick since 02/2022. Notes one month before she started having symptoms, her Arman Filter was doubled from 1.5 to 78m. During recent hospitalization there was question that her  symptoms may be due to this change, her PCP has started tapering off medication, last Friday dropped  back to 1.5 mg.   Last two weeks, vomiting almost every day. Dry heaves every day. Nausea and dizziness daily. She notes she had been feeling poor since 02/2022 although about a month ago she had 3 weeks of feeling well, was able to work. Heartburn well controlled. Has epigastric burning.   At baseline, typically would have "blowout" twice per day. Now having constipation over the past 2-3 weeks. Complains of rectal pressure. Last BM one week ago. Taking Miralax BID. No melena, brbpr.   Discharged home on Reglan BID. Seems to be tolerating. Drinking Ensure two daily. Ate a BBQ sandwich yesterday. Today having some lower abdominal pain.    Followed for cardiology with extensive work up for palpitations, dizziness, presyncopal symptoms. . Has been on aldactone for high bp since early 2023.              Medications   Current Outpatient Medications  Medication Sig Dispense Refill   albuterol (PROVENTIL HFA;VENTOLIN HFA) 108 (90 BASE) MCG/ACT inhaler Inhale 2 puffs into the lungs every 6 (six) hours as needed for shortness of breath.      ALPRAZolam (XANAX) 0.5 MG tablet Take 0.5 mg by mouth 2 (two) times daily.     ciprofloxacin (CIPRO) 500 MG tablet Take 1 tablet (500 mg total) by mouth every 12 (twelve) hours. 14 tablet 0   famotidine (PEPCID) 40 MG tablet Take 1 tablet (40 mg total) by mouth 2 (two) times daily. 60 tablet 1   fluticasone (FLONASE) 50 MCG/ACT nasal spray Place 1-2 sprays into both nostrils daily.     metoCLOPramide (REGLAN) 10  MG tablet Take 1 tablet (10 mg total) by mouth every 6 (six) hours. 30 tablet 0   metroNIDAZOLE (FLAGYL) 500 MG tablet Take 1 tablet (500 mg total) by mouth 3 (three) times daily. 21 tablet 0   nebivolol (BYSTOLIC) 10 MG tablet Take 10 mg by mouth at bedtime.     ondansetron (ZOFRAN) 4 MG tablet Take 1 tablet (4 mg total) by mouth every 6 (six) hours as needed for nausea. 20 tablet 0   spironolactone (ALDACTONE) 25 MG tablet Take 1 tablet (25 mg total)  by mouth daily. (Patient taking differently: Take 25 mg by mouth at bedtime.) 90 tablet 3   sucralfate (CARAFATE) 1 g tablet Take 1 tablet (1 g total) by mouth 4 (four) times daily. 120 tablet 1   VRAYLAR 3 MG capsule Take 3 mg by mouth daily.     No current facility-administered medications for this visit.    Allergies   Allergies as of 10/22/2022 - Review Complete 10/22/2022  Allergen Reaction Noted   Bee venom Anaphylaxis 03/07/2021   Motrin [ibuprofen] Other (See Comments) 07/18/2022   Codeine Nausea And Vomiting and Rash 05/21/2011   Penicillins Nausea And Vomiting and Rash 05/21/2011   Prilosec [omeprazole] Swelling and Rash 03/15/2013   Zantac [ranitidine] Rash 03/07/2021     Review of Systems   General: Negative for fever, chills, fatigue, weakness. See hpi ENT: Negative for hoarseness, difficulty swallowing , nasal congestion. CV: Negative for chest pain, angina,   dyspnea on exertion, peripheral edema. See hpi Respiratory: Negative for dyspnea at rest, dyspnea on exertion, cough, sputum, wheezing.  GI: See history of present illness. GU:  Negative for dysuria, hematuria, urinary incontinence, urinary frequency, nocturnal urination.  Endo: Negative for unusual weight change.     Physical Exam   BP 104/71 (BP Location: Right Arm, Patient Position: Sitting, Cuff Size: Normal)   Pulse 80   Temp 97.9 F (36.6 C) (Oral)   Ht 5' 2"$  (1.575 m)   Wt 122 lb (55.3 kg)   BMI 22.31 kg/m    General: Well-nourished, well-developed in no acute distress.  Eyes: No icterus. Mouth: Oropharyngeal mucosa moist and pink   Abdomen: Bowel sounds are normal, nontender, nondistended, no hepatosplenomegaly or masses,  no abdominal bruits or hernia , no rebound or guarding.  Rectal: not performed Extremities: No lower extremity edema. No clubbing or deformities. Neuro: Alert and oriented x 4   Skin: Warm and dry, no jaundice.   Psych: Alert and cooperative, normal mood and  affect.  Labs   Lab Results  Component Value Date   CREATININE 0.79 10/20/2022   BUN 11 10/20/2022   NA 139 10/20/2022   K 3.5 10/20/2022   CL 101 10/20/2022   CO2 27 10/20/2022   Lab Results  Component Value Date   ALT 13 10/15/2022   AST 15 10/15/2022   ALKPHOS 35 (L) 10/15/2022   BILITOT 1.2 10/15/2022   Lab Results  Component Value Date   WBC 10.6 (H) 10/20/2022   HGB 14.6 10/20/2022   HCT 42.5 10/20/2022   MCV 96.2 10/20/2022   PLT 185 10/20/2022   Lab Results  Component Value Date   LIPASE 32 10/13/2022   Lab Results  Component Value Date   TSH 1.106 10/14/2022   Fasting am cortisol 9.4 10/16/22  Imaging Studies   CT ABDOMEN PELVIS W CONTRAST  Result Date: 10/20/2022 CLINICAL DATA:  Abdominal pain EXAM: CT ABDOMEN AND PELVIS WITH CONTRAST TECHNIQUE: Multidetector CT imaging of  the abdomen and pelvis was performed using the standard protocol following bolus administration of intravenous contrast. RADIATION DOSE REDUCTION: This exam was performed according to the departmental dose-optimization program which includes automated exposure control, adjustment of the mA and/or kV according to patient size and/or use of iterative reconstruction technique. CONTRAST:  137m OMNIPAQUE IOHEXOL 300 MG/ML  SOLN COMPARISON:  MRI abdomen May 17, 2022 and CT abdomen pelvis April 11, 2022. FINDINGS: Lower chest: No acute abnormality. Hepatobiliary: Oblong lesion in the hepatic dome on image 7/2 demonstrate nonspecific imaging features on MRI May 17, 2022 but has been present dating back to 2006 compatible with a benign etiology. Gallbladder surgically absent. Similar prominence of the intrahepatic biliary tree with normal caliber common duct measuring 5 cm. Pancreas: No pancreatic ductal dilation or evidence of acute inflammation. Spleen: No splenomegaly. Adrenals/Urinary Tract: Bilateral adrenal glands appear normal. No hydronephrosis. Left cortical renal scarring. Wedge-shaped  hypoenhancing areas in the left lower pole kidney for instance on image 21/2 and 23/2 with overlying cortical scarring. Bilateral hypodense renal lesions are technically too small to accurately characterize but statistically likely to reflect cysts and considered benign requiring no independent imaging follow-up. Urinary bladder is unremarkable for degree of distension. Stomach/Bowel: Tiny hiatal hernia. No pathologic dilation of small or large bowel. Normal appendix moderate volume of formed stool throughout the colon. Sigmoid colonic diverticulosis with inflammatory stranding along the sigmoid colon compatible with sigmoid colonic diverticulitis/colitis. Vascular/Lymphatic: Aortic atherosclerosis. Normal caliber abdominal aorta. Smooth IVC contours. No pathologically enlarged abdominal or pelvic lymph nodes. Reproductive: Status post hysterectomy. No adnexal masses. Other: No walled off fluid collections.  No pneumoperitoneum. Musculoskeletal: No acute osseous abnormality. IMPRESSION: 1. Acute uncomplicated sigmoid colonic diverticulitis/colitis. 2. Wedge-shaped hypoenhancing areas in the left lower pole kidney with overlying cortical scarring, suggest correlation with laboratory values for pyelonephritis. 3.  Aortic Atherosclerosis (ICD10-I70.0). Electronically Signed   By: JDahlia BailiffM.D.   On: 10/20/2022 21:32    Assessment   GERD: typical symptoms controlled. Currently on famotidine. Epigastric burning improved.   Intractable N/V: etiology unclear. Possible side effect from [place] Currently weaning off. Anxiety may be playing a role.   Weight loss: may be secondary to poor oral intake but would recommend ruling out malignancy. She has had CT A/P with no apparent malignancy. She is chronic smoker and is at risk of lung cancer.   Constipation: trial of Linzess.   Diverticulitis/colitis: monitor for persistent abdominal pain, complete abx.    PLAN   May benefit from Chest CT to further  evaluate unintentional weight loss. To review records.  Complete Cipro/Flagyl with food.  Start Linzess 724m once daily as needed. Continue reglan and famotidine for now.  Consume soft low fiber diet.   Angela OchsLeBobby RumpfMHNew OxfordPABarrettastroenter[facility] Associates

## 2022-10-24 DIAGNOSIS — F17210 Nicotine dependence, cigarettes, uncomplicated: Secondary | ICD-10-CM | POA: Diagnosis not present

## 2022-10-24 DIAGNOSIS — R112 Nausea with vomiting, unspecified: Secondary | ICD-10-CM | POA: Diagnosis not present

## 2022-10-24 DIAGNOSIS — K5792 Diverticulitis of intestine, part unspecified, without perforation or abscess without bleeding: Secondary | ICD-10-CM | POA: Diagnosis not present

## 2022-10-27 ENCOUNTER — Encounter (HOSPITAL_BASED_OUTPATIENT_CLINIC_OR_DEPARTMENT_OTHER): Payer: Self-pay | Admitting: Cardiology

## 2022-10-28 ENCOUNTER — Telehealth: Payer: Self-pay

## 2022-10-28 DIAGNOSIS — F17200 Nicotine dependence, unspecified, uncomplicated: Secondary | ICD-10-CM

## 2022-10-28 DIAGNOSIS — R634 Abnormal weight loss: Secondary | ICD-10-CM

## 2022-10-28 NOTE — Addendum Note (Signed)
Addended by: Mahala Menghini on: 10/28/2022 10:24 PM   Modules accepted: Orders

## 2022-10-28 NOTE — Telephone Encounter (Signed)
Reviewed her records. She had neg chest xray 11/2021. 34 pound weight loss in a smoker, would suggest CT chest.   I put in for chest ct with contrast. I don't know if contrast is advised or not. Can you ask radiology?

## 2022-10-28 NOTE — Telephone Encounter (Signed)
Pt called stating that she was expecting a return call from you regarding a chest xray. Please advise.

## 2022-10-29 NOTE — Telephone Encounter (Signed)
Pt was made aware and verbalized understanding.  

## 2022-10-29 NOTE — Telephone Encounter (Signed)
Thanks Mindy. My question was more whether or not they needed IV contrast for purpose of weight loss in a smoker, looking for lung cancer.

## 2022-10-29 NOTE — Telephone Encounter (Signed)
PA done and approved via [facility] Order ID: [id number]       Approval Valid Through: 10/29/2022 - 12/27/2022   ---  Per Anderson Malta in [place] they do not drink oral contrast with CT chest w/ contrast. Just IV contrast.

## 2022-10-29 NOTE — Telephone Encounter (Signed)
Spoke with Anderson Malta in [place] and she stated do with IV.

## 2022-10-30 NOTE — Telephone Encounter (Signed)
noted 

## 2022-10-31 ENCOUNTER — Encounter (HOSPITAL_COMMUNITY): Payer: Self-pay | Admitting: *Deleted

## 2022-10-31 ENCOUNTER — Other Ambulatory Visit: Payer: Self-pay

## 2022-10-31 ENCOUNTER — Emergency Department (HOSPITAL_COMMUNITY)
Admission: EM | Admit: 2022-10-31 | Discharge: 2022-10-31 | Disposition: A | Discharge status: Home / Self Care | Payer: BC Managed Care – PPO | Attending: Emergency Medicine | Admitting: Emergency Medicine

## 2022-10-31 DIAGNOSIS — R112 Nausea with vomiting, unspecified: Secondary | ICD-10-CM | POA: Diagnosis not present

## 2022-10-31 DIAGNOSIS — D72829 Elevated white blood cell count, unspecified: Secondary | ICD-10-CM | POA: Diagnosis not present

## 2022-10-31 DIAGNOSIS — R1084 Generalized abdominal pain: Secondary | ICD-10-CM | POA: Insufficient documentation

## 2022-10-31 DIAGNOSIS — R1110 Vomiting, unspecified: Secondary | ICD-10-CM

## 2022-10-31 DIAGNOSIS — Z809 Family history of malignant neoplasm, unspecified: Secondary | ICD-10-CM | POA: Diagnosis not present

## 2022-10-31 DIAGNOSIS — R634 Abnormal weight loss: Secondary | ICD-10-CM | POA: Diagnosis not present

## 2022-10-31 DIAGNOSIS — R627 Adult failure to thrive: Secondary | ICD-10-CM | POA: Diagnosis not present

## 2022-10-31 DIAGNOSIS — I10 Essential (primary) hypertension: Secondary | ICD-10-CM | POA: Diagnosis not present

## 2022-10-31 LAB — CBC WITH DIFFERENTIAL/PLATELET
Abs Immature Granulocytes: 0.04 10*3/uL (ref 0.00–0.07)
Basophils Absolute: 0.1 10*3/uL (ref 0.0–0.1)
Basophils Relative: 1 %
Eosinophils Absolute: 0.2 10*3/uL (ref 0.0–0.5)
Eosinophils Relative: 2 %
HCT: 47 % — ABNORMAL HIGH (ref 36.0–46.0)
Hemoglobin: 16 g/dL — ABNORMAL HIGH (ref 12.0–15.0)
Immature Granulocytes: 0 %
Lymphocytes Relative: 17 %
Lymphs Abs: 2.1 10*3/uL (ref 0.7–4.0)
MCH: 33.1 pg (ref 26.0–34.0)
MCHC: 34 g/dL (ref 30.0–36.0)
MCV: 97.3 fL (ref 80.0–100.0)
Monocytes Absolute: 0.6 10*3/uL (ref 0.1–1.0)
Monocytes Relative: 5 %
Neutro Abs: 9.6 10*3/uL — ABNORMAL HIGH (ref 1.7–7.7)
Neutrophils Relative %: 75 %
Platelets: 262 10*3/uL (ref 150–400)
RBC: 4.83 MIL/uL (ref 3.87–5.11)
RDW: 13.7 % (ref 11.5–15.5)
WBC: 12.6 10*3/uL — ABNORMAL HIGH (ref 4.0–10.5)
nRBC: 0 % (ref 0.0–0.2)

## 2022-10-31 LAB — COMPREHENSIVE METABOLIC PANEL
ALT: 20 U/L (ref 0–44)
AST: 23 U/L (ref 15–41)
Albumin: 4.2 g/dL (ref 3.5–5.0)
Alkaline Phosphatase: 44 U/L (ref 38–126)
Anion gap: 9 (ref 5–15)
BUN: 13 mg/dL (ref 6–20)
CO2: 27 mmol/L (ref 22–32)
Calcium: 9 mg/dL (ref 8.9–10.3)
Chloride: 99 mmol/L (ref 98–111)
Creatinine, Ser: 0.81 mg/dL (ref 0.44–1.00)
GFR, Estimated: >60 mL/min (ref >60)
Glucose, Bld: 116 mg/dL — ABNORMAL HIGH (ref 70–99)
Potassium: 4.3 mmol/L (ref 3.5–5.1)
Sodium: 135 mmol/L (ref 135–145)
Total Bilirubin: 0.9 mg/dL (ref 0.3–1.2)
Total Protein: 7 g/dL (ref 6.5–8.1)

## 2022-10-31 LAB — MAGNESIUM: Magnesium: 2.2 mg/dL (ref 1.7–2.4)

## 2022-10-31 MED ORDER — METOCLOPRAMIDE HCL 5 MG/ML IJ SOLN
10.0000 mg | Freq: Once | INTRAMUSCULAR | Status: AC
  Administered 2022-10-31: 10 mg via INTRAVENOUS
  Filled 2022-10-31: qty 2

## 2022-10-31 MED ORDER — LACTATED RINGERS IV BOLUS
1000.0000 mL | Freq: Once | INTRAVENOUS | Status: AC
  Administered 2022-10-31: 1000 mL via INTRAVENOUS

## 2022-10-31 NOTE — ED Triage Notes (Signed)
Pt has had chronic nausea and vomiting and weight loss since June.  Pt was recently hospitalized for 4 days due to this but no dx was made.  Pt has had 2 days of vomiting and had a syncopal episode at PCP and was told to come here for evaluation.

## 2022-10-31 NOTE — Discharge Instructions (Signed)
If you develop worsening, continued, or recurrent abdominal pain, uncontrolled vomiting, fever, chest or back pain, or any other new/concerning symptoms then return to the ER for evaluation.  

## 2022-10-31 NOTE — ED Provider Notes (Signed)
[facility] Provider Note   CSN: [id number] Arrival date & time: 10/31/22  1113     History  Chief Complaint  Patient presents with   Emesis    [name] is a 57 y.o. female.  HPI 57 year old female with a history of chronic nausea and vomiting of unclear etiology who follows with Dr. Abbey Chatters of GI presents with vomiting and syncope.  She has been having vomiting since yesterday.  She has vomited twice this morning.  She has been feeling lightheaded since yesterday.  She was in her doctor's office for a follow-up visit and then while laying down passed out.  She never felt a sudden headache, chest pain, shortness of breath, palpitations.  When she woke up she had a little bit of a headache.  Significant other had to stimulate her to wake her up.  Otherwise, patient denies any abdominal pain besides some soreness from vomiting.  No hematemesis, diarrhea, fever.  Recently got over diverticulitis and has finished those antibiotics. Was sent here for evaluation.  Home Medications Prior to Admission medications   Medication Sig Start Date End Date Taking? Authorizing Provider  albuterol (PROVENTIL HFA;VENTOLIN HFA) 108 (90 BASE) MCG/ACT inhaler Inhale 2 puffs into the lungs every 6 (six) hours as needed for shortness of breath.    Yes [provider]  ALPRAZolam Duanne Moron) 0.5 MG tablet Take 0.5 mg by mouth 2 (two) times daily. 04/13/21  Yes [provider]  cariprazine (VRAYLAR) 1.5 MG capsule Take 1.5 mg by mouth daily.   Yes [provider]  famotidine (PEPCID) 40 MG tablet Take 1 tablet (40 mg total) by mouth 2 (two) times daily. 05/02/22  Yes Jodi Mourning, Kristen S, PA-C  fluticasone (FLONASE) 50 MCG/ACT nasal spray Place 1-2 sprays into both nostrils daily.   Yes [provider]  metoCLOPramide (REGLAN) 10 MG tablet Take 1 tablet (10 mg total) by mouth every 6 (six) hours. 10/17/22  Yes Dessa Phi, DO  nebivolol  (BYSTOLIC) 10 MG tablet Take 10 mg by mouth at bedtime.   Yes [provider]  ondansetron (ZOFRAN) 4 MG tablet Take 1 tablet (4 mg total) by mouth every 6 (six) hours as needed for nausea. 10/17/22  Yes Dessa Phi, DO  spironolactone (ALDACTONE) 25 MG tablet Take 1 tablet (25 mg total) by mouth daily. Patient taking differently: Take 25 mg by mouth at bedtime. 02/05/22 01/31/23 Yes Buford Dresser, MD  sucralfate (CARAFATE) 1 g tablet Take 1 tablet (1 g total) by mouth 4 (four) times daily. 10/17/22 12/16/22 Yes Dessa Phi, DO  ciprofloxacin (CIPRO) 500 MG tablet Take 1 tablet (500 mg total) by mouth every 12 (twelve) hours. Patient not taking: Reported on 10/31/2022 10/20/22   Azucena Cecil, PA-C  linaclotide [facility]) 72 MCG capsule Take 1 capsule (72 mcg total) by mouth daily before breakfast. Patient not taking: Reported on 10/31/2022 10/22/22   Mahala Menghini, PA-C  metroNIDAZOLE (FLAGYL) 500 MG tablet Take 1 tablet (500 mg total) by mouth 3 (three) times daily. Patient not taking: Reported on 10/31/2022 10/20/22   Azucena Cecil, PA-C      Allergies    Bee venom, Motrin [ibuprofen], Codeine, Penicillins, Prilosec [omeprazole], and Zantac [ranitidine]    Review of Systems   Review of Systems  Constitutional:  Negative for fever.  Respiratory:  Negative for shortness of breath.   Cardiovascular:  Negative for chest pain and palpitations.  Gastrointestinal:  Positive for nausea and  vomiting. Negative for abdominal pain.  Neurological:  Positive for syncope and light-headedness.    Physical Exam Updated Vital Signs BP 123/85 (BP Location: Left Arm)   Pulse 83   Temp 98 F (36.7 C) (Oral)   Resp 16   Ht '5\' 2"'$  (1.575 m)   Wt 53.1 kg   SpO2 99%   BMI 21.40 kg/m  Physical Exam Vitals and nursing note reviewed.  Constitutional:      General: She is not in acute distress.    Appearance: She is well-developed. She is not ill-appearing or diaphoretic.   HENT:     Head: Normocephalic and atraumatic.  Cardiovascular:     Rate and Rhythm: Normal rate and regular rhythm.     Heart sounds: Normal heart sounds.  Pulmonary:     Effort: Pulmonary effort is normal.     Breath sounds: Normal breath sounds.  Abdominal:     Palpations: Abdomen is soft.     Tenderness: There is abdominal tenderness (mild, generalized).  Skin:    General: Skin is warm and dry.  Neurological:     Mental Status: She is alert.     ED Results / Procedures / Treatments   Labs (all labs ordered are listed, but only abnormal results are displayed) Labs Reviewed  COMPREHENSIVE METABOLIC PANEL - Abnormal; Notable for the following components:      Result Value   Glucose, Bld 116 (*)    All other components within normal limits  CBC WITH DIFFERENTIAL/PLATELET - Abnormal; Notable for the following components:   WBC 12.6 (*)    Hemoglobin 16.0 (*)    HCT 47.0 (*)    Neutro Abs 9.6 (*)    All other components within normal limits  MAGNESIUM    EKG EKG Interpretation  Date/Time:  Thursday October 31 2022 11:36:10 EST Ventricular Rate:  80 PR Interval:  157 QRS Duration: 82 QT Interval:  381 QTC Calculation: 440 R Axis:   84 Text Interpretation: Sinus rhythm Baseline wander in lead(s) V2 no acute ST/T changes no significant change since Jan 2024 Confirmed by Sherwood Gambler [phone]) on 10/31/2022 11:45:55 AM  Radiology No results found.  Procedures Procedures    Medications Ordered in ED Medications  lactated ringers bolus 1,000 mL (1,000 mLs Intravenous New Bag/Given 10/31/22 1207)  metoCLOPramide (REGLAN) injection 10 mg (10 mg Intravenous Given 10/31/22 1207)    ED Course/ Medical Decision Making/ A&P                             Medical Decision Making Amount and/or Complexity of Data Reviewed Labs: ordered.    Details: No AKI or significant electrolyte disturbance. Mild leukocytosis, could be reactive to vomiting ECG/medicine tests: ordered  and independent interpretation performed.    Details: No arrhythmia/ischemia  Risk Prescription drug management.   Patient's syncope is probably related to recent vomiting and some dehydration.  I doubt arrhythmia or neurogenic cause.  Patient is otherwise well-appearing with stable vitals.  Was given fluids and Reglan.  Labs show mild leukocytosis that could be stress response to the vomiting.  She does have some lower abdominal or rather just diffuse abdominal discomfort but she states that can sometimes happen when she is vomiting like this and she is also getting over diverticulitis.  No fevers.  We discussed potentially doing a CT eval but we decided to ultimately hold off after discussion with her, husband, and myself.  I think  complicated diverticulitis is unlikely.  However we discussed that if her symptoms do not improve or worsen then she should return to the ER.  Will discharge home with return precautions.        Final Clinical Impression(s) / ED Diagnoses Final diagnoses:  Vomiting in adult    Rx / DC Orders ED Discharge Orders     None         Sherwood Gambler, MD 10/31/22 1352

## 2022-11-04 DIAGNOSIS — R109 Unspecified abdominal pain: Secondary | ICD-10-CM | POA: Diagnosis not present

## 2022-11-04 DIAGNOSIS — Z008 Encounter for other general examination: Secondary | ICD-10-CM | POA: Diagnosis not present

## 2022-11-04 DIAGNOSIS — F411 Generalized anxiety disorder: Secondary | ICD-10-CM | POA: Diagnosis not present

## 2022-11-04 DIAGNOSIS — R5383 Other fatigue: Secondary | ICD-10-CM | POA: Diagnosis not present

## 2022-11-13 ENCOUNTER — Emergency Department (HOSPITAL_COMMUNITY)
Admission: EM | Admit: 2022-11-13 | Discharge: 2022-11-13 | Disposition: A | Discharge status: Home / Self Care | Payer: BC Managed Care – PPO | Attending: Emergency Medicine | Admitting: Emergency Medicine

## 2022-11-13 ENCOUNTER — Encounter (HOSPITAL_COMMUNITY): Payer: Self-pay | Admitting: *Deleted

## 2022-11-13 ENCOUNTER — Other Ambulatory Visit: Payer: Self-pay

## 2022-11-13 DIAGNOSIS — R112 Nausea with vomiting, unspecified: Secondary | ICD-10-CM | POA: Diagnosis not present

## 2022-11-13 LAB — MAGNESIUM: Magnesium: 2 mg/dL (ref 1.7–2.4)

## 2022-11-13 LAB — COMPREHENSIVE METABOLIC PANEL
ALT: 16 U/L (ref 0–44)
AST: 17 U/L (ref 15–41)
Albumin: 4.2 g/dL (ref 3.5–5.0)
Alkaline Phosphatase: 43 U/L (ref 38–126)
Anion gap: 10 (ref 5–15)
BUN: 14 mg/dL (ref 6–20)
CO2: 28 mmol/L (ref 22–32)
Calcium: 8.9 mg/dL (ref 8.9–10.3)
Chloride: 96 mmol/L — ABNORMAL LOW (ref 98–111)
Creatinine, Ser: 0.78 mg/dL (ref 0.44–1.00)
GFR, Estimated: >60 mL/min (ref >60)
Glucose, Bld: 107 mg/dL — ABNORMAL HIGH (ref 70–99)
Potassium: 4.1 mmol/L (ref 3.5–5.1)
Sodium: 134 mmol/L — ABNORMAL LOW (ref 135–145)
Total Bilirubin: 1.4 mg/dL — ABNORMAL HIGH (ref 0.3–1.2)
Total Protein: 7 g/dL (ref 6.5–8.1)

## 2022-11-13 LAB — LIPASE, BLOOD: Lipase: 26 U/L (ref 11–51)

## 2022-11-13 LAB — URINALYSIS, ROUTINE W REFLEX MICROSCOPIC
Bacteria, UA: NONE SEEN
Bilirubin Urine: NEGATIVE
Glucose, UA: NEGATIVE mg/dL
Hgb urine dipstick: NEGATIVE
Ketones, ur: 5 mg/dL — AB
Nitrite: NEGATIVE
Protein, ur: NEGATIVE mg/dL
Specific Gravity, Urine: 1.005 (ref 1.005–1.030)
pH: 6 (ref 5.0–8.0)

## 2022-11-13 LAB — CBC
HCT: 44.8 % (ref 36.0–46.0)
Hemoglobin: 15.2 g/dL — ABNORMAL HIGH (ref 12.0–15.0)
MCH: 33 pg (ref 26.0–34.0)
MCHC: 33.9 g/dL (ref 30.0–36.0)
MCV: 97.4 fL (ref 80.0–100.0)
Platelets: 209 10*3/uL (ref 150–400)
RBC: 4.6 MIL/uL (ref 3.87–5.11)
RDW: 13.2 % (ref 11.5–15.5)
WBC: 11.3 10*3/uL — ABNORMAL HIGH (ref 4.0–10.5)
nRBC: 0 % (ref 0.0–0.2)

## 2022-11-13 MED ORDER — ONDANSETRON HCL 4 MG/2ML IJ SOLN
4.0000 mg | Freq: Once | INTRAMUSCULAR | Status: AC
  Administered 2022-11-13: 4 mg via INTRAVENOUS
  Filled 2022-11-13: qty 2

## 2022-11-13 MED ORDER — SODIUM CHLORIDE 0.9 % IV BOLUS
1000.0000 mL | Freq: Once | INTRAVENOUS | Status: AC
  Administered 2022-11-13: 1000 mL via INTRAVENOUS

## 2022-11-13 NOTE — ED Notes (Signed)
Pt was able to drink a can of ginger ale with no vomiting.

## 2022-11-13 NOTE — Discharge Instructions (Signed)
Frequent small sips of fluids.  Continue taking your nausea medication as directed.  Please follow-up with your primary care provider this week for recheck.  Return to the emergency department for any new or worsening symptoms.

## 2022-11-13 NOTE — ED Triage Notes (Signed)
Pt with continued emesis, pt states she is dehydrated-no urine today.  Denies any diarrhea. Denies any pain.

## 2022-11-14 DIAGNOSIS — H903 Sensorineural hearing loss, bilateral: Secondary | ICD-10-CM | POA: Diagnosis not present

## 2022-11-14 DIAGNOSIS — R42 Dizziness and giddiness: Secondary | ICD-10-CM | POA: Diagnosis not present

## 2022-11-15 NOTE — ED Provider Notes (Signed)
[name]   CSN: [id number] Arrival date & time: 11/13/22  1115     History  Chief Complaint  Patient presents with   Emesis    [name] is a 57 y.o. female.   Emesis Associated symptoms: no abdominal pain, no arthralgias, no chills, no cough, no diarrhea, no fever, no headaches, no myalgias and no sore throat          [name] is a 57 y.o. female who presents to the Emergency Department complaining of nausea and vomiting.  Symptoms present for several days, she has begun to feel better, but concerned that she is dehydrated.  She reports decreased urine output and some feeling of fatigue.  She denies abdominal pain, diarrhea, fever and dysuria.     Home Medications Prior to Admission medications   Medication Sig Start Date End Date Taking? Authorizing Provider  albuterol (PROVENTIL HFA;VENTOLIN HFA) 108 (90 BASE) MCG/ACT inhaler Inhale 2 puffs into the lungs every 6 (six) hours as needed for shortness of breath.     [provider]  ALPRAZolam Duanne Moron) 0.5 MG tablet Take 0.5 mg by mouth 2 (two) times daily. 04/13/21   [provider]  cariprazine (VRAYLAR) 1.5 MG capsule Take 1.5 mg by mouth daily.    [provider]  ciprofloxacin (CIPRO) 500 MG tablet Take 1 tablet (500 mg total) by mouth every 12 (twelve) hours. Patient not taking: Reported on 10/31/2022 10/20/22   Angela Cecil, PA-C  famotidine (PEPCID) 40 MG tablet Take 1 tablet (40 mg total) by mouth 2 (two) times daily. 05/02/22   Angela Rasher, PA-C  fluticasone (FLONASE) 50 MCG/ACT nasal spray Place 1-2 sprays into both nostrils daily.    [provider]  linaclotide Rolan Lipa) 72 MCG capsule Take 1 capsule (72 mcg total) by mouth daily before breakfast. Patient not taking: Reported on 10/31/2022 10/22/22   Angela Menghini, PA-C  metoCLOPramide (REGLAN) 10 MG tablet Take 1 tablet (10 mg total) by mouth every 6  (six) hours. 10/17/22   Angela Phi, DO  metroNIDAZOLE (FLAGYL) 500 MG tablet Take 1 tablet (500 mg total) by mouth 3 (three) times daily. Patient not taking: Reported on 10/31/2022 10/20/22   Angela Cecil, PA-C  nebivolol (BYSTOLIC) 10 MG tablet Take 10 mg by mouth at bedtime.    [provider]  ondansetron (ZOFRAN) 4 MG tablet Take 1 tablet (4 mg total) by mouth every 6 (six) hours as needed for nausea. 10/17/22   Angela Phi, DO  spironolactone (ALDACTONE) 25 MG tablet Take 1 tablet (25 mg total) by mouth daily. Patient taking differently: Take 25 mg by mouth at bedtime. 02/05/22 01/31/23  Angela Dresser, MD  sucralfate (CARAFATE) 1 g tablet Take 1 tablet (1 g total) by mouth 4 (four) times daily. 10/17/22 12/16/22  Angela Phi, DO      Allergies    Bee venom, Motrin [ibuprofen], Codeine, Penicillins, Prilosec [omeprazole], and Zantac [ranitidine]    Review of Systems   Review of Systems  Constitutional:  Positive for appetite change. Negative for chills and fever.  HENT:  Negative for sore throat.   Respiratory:  Negative for cough and chest tightness.   Cardiovascular:  Negative for chest pain.  Gastrointestinal:  Positive for nausea and vomiting. Negative for abdominal pain and diarrhea.  Genitourinary:  Negative for dysuria and flank pain.  Musculoskeletal:  Negative for arthralgias, back pain and myalgias.  Skin:  Negative for rash.  Neurological:  Negative for dizziness, syncope, weakness, numbness and headaches.    Physical Exam Updated Vital Signs BP 128/78 (BP Location: Right Arm)   Pulse 78   Temp 98.2 F (36.8 C) (Oral)   Resp 18   Ht '5\' 2"'$  (1.575 m)   Wt 53.5 kg   SpO2 99%   BMI 21.58 kg/m  Physical Exam Vitals and nursing Paul reviewed.  Constitutional:      General: She is not in acute distress.    Appearance: Normal appearance. She is not ill-appearing.  HENT:     Mouth/Throat:     Mouth: Mucous membranes are moist.      Comments: Mucous membranes are slightly dry Cardiovascular:     Rate and Rhythm: Normal rate and regular rhythm.     Pulses: Normal pulses.  Pulmonary:     Effort: Pulmonary effort is normal. No respiratory distress.     Breath sounds: Normal breath sounds.  Abdominal:     General: There is no distension.     Palpations: Abdomen is soft.     Tenderness: There is no abdominal tenderness.  Musculoskeletal:        General: Normal range of motion.  Skin:    General: Skin is warm.     Capillary Refill: Capillary refill takes less than 2 seconds.     Findings: No rash.  Neurological:     General: No focal deficit present.     Mental Status: She is alert.     Sensory: No sensory deficit.     Motor: No weakness.     ED Results / Procedures / Treatments   Labs (all labs ordered are listed, but only abnormal results are displayed) Labs Reviewed  COMPREHENSIVE METABOLIC PANEL - Abnormal; Notable for the following components:      Result Value   Sodium 134 (*)    Chloride 96 (*)    Glucose, Bld 107 (*)    Total Bilirubin 1.4 (*)    All other components within normal limits  CBC - Abnormal; Notable for the following components:   WBC 11.3 (*)    Hemoglobin 15.2 (*)    All other components within normal limits  URINALYSIS, ROUTINE W REFLEX MICROSCOPIC - Abnormal; Notable for the following components:   Color, Urine STRAW (*)    Ketones, ur 5 (*)    Leukocytes,Ua TRACE (*)    All other components within normal limits  LIPASE, BLOOD  MAGNESIUM    EKG None  Radiology No results found.  Procedures Procedures    Medications Ordered in ED Medications  sodium chloride 0.9 % bolus 1,000 mL (0 mLs Intravenous Stopped 11/13/22 1732)  ondansetron (ZOFRAN) injection 4 mg (4 mg Intravenous Given 11/13/22 1550)    ED Course/ Medical Decision Making/ A&P                             Medical Decision Making Pt here for evaluation for dehydration.  Reports multiple episodes of  vomiting over last several days, no abdominal pain, fever.  Sx's improving.   I suspect sx's viral, acute abdominal process considered but felt less likely as pt is w/o abd pain and fever.    Amount and/or Complexity of Data Reviewed Labs: ordered.    Details: Labs w/o significant findings.   ECG/medicine tests: independent interpretation performed. Discussion of management or test interpretation with external provider(s): On recheck, pt reports feeling  better after IVF's and antiemetic and ready for d/c home. Likely viral process.  Rx written for zofran.     Risk Prescription drug management.           Final Clinical Impression(s) / ED Diagnoses Final diagnoses:  Nausea and vomiting, unspecified vomiting type    Rx / DC Orders ED Discharge Orders     None         Kem Parkinson, PA-C 11/16/22 0001    Milton Ferguson, MD 11/16/22 [phone]

## 2022-11-17 ENCOUNTER — Encounter (HOSPITAL_BASED_OUTPATIENT_CLINIC_OR_DEPARTMENT_OTHER): Payer: Self-pay | Admitting: Cardiology

## 2022-11-19 DIAGNOSIS — I700 Atherosclerosis of aorta: Secondary | ICD-10-CM | POA: Diagnosis not present

## 2022-11-19 DIAGNOSIS — R6881 Early satiety: Secondary | ICD-10-CM | POA: Diagnosis not present

## 2022-11-19 DIAGNOSIS — F3132 Bipolar disorder, current episode depressed, moderate: Secondary | ICD-10-CM | POA: Diagnosis not present

## 2022-11-19 DIAGNOSIS — R112 Nausea with vomiting, unspecified: Secondary | ICD-10-CM | POA: Diagnosis not present

## 2022-11-19 DIAGNOSIS — E860 Dehydration: Secondary | ICD-10-CM | POA: Diagnosis not present

## 2022-11-19 DIAGNOSIS — R42 Dizziness and giddiness: Secondary | ICD-10-CM | POA: Diagnosis not present

## 2022-11-19 DIAGNOSIS — E559 Vitamin D deficiency, unspecified: Secondary | ICD-10-CM | POA: Diagnosis not present

## 2022-11-19 DIAGNOSIS — Z1330 Encounter for screening examination for mental health and behavioral disorders, unspecified: Secondary | ICD-10-CM | POA: Diagnosis not present

## 2022-11-28 DIAGNOSIS — R42 Dizziness and giddiness: Secondary | ICD-10-CM | POA: Diagnosis not present

## 2022-11-28 DIAGNOSIS — H6983 Other specified disorders of Eustachian tube, bilateral: Secondary | ICD-10-CM | POA: Diagnosis not present

## 2022-12-05 DIAGNOSIS — H6983 Other specified disorders of Eustachian tube, bilateral: Secondary | ICD-10-CM | POA: Diagnosis not present

## 2022-12-05 DIAGNOSIS — H6122 Impacted cerumen, left ear: Secondary | ICD-10-CM | POA: Diagnosis not present

## 2022-12-05 DIAGNOSIS — H6521 Chronic serous otitis media, right ear: Secondary | ICD-10-CM | POA: Diagnosis not present

## 2022-12-05 DIAGNOSIS — R42 Dizziness and giddiness: Secondary | ICD-10-CM | POA: Diagnosis not present

## 2022-12-06 DIAGNOSIS — R42 Dizziness and giddiness: Secondary | ICD-10-CM | POA: Diagnosis not present

## 2022-12-06 DIAGNOSIS — R110 Nausea: Secondary | ICD-10-CM | POA: Diagnosis not present

## 2022-12-09 DIAGNOSIS — H6981 Other specified disorders of Eustachian tube, right ear: Secondary | ICD-10-CM | POA: Diagnosis not present

## 2022-12-09 DIAGNOSIS — H6521 Chronic serous otitis media, right ear: Secondary | ICD-10-CM | POA: Diagnosis not present

## 2022-12-13 ENCOUNTER — Ambulatory Visit (HOSPITAL_COMMUNITY)
Admission: RE | Admit: 2022-12-13 | Discharge: 2022-12-13 | Disposition: A | Discharge status: Home / Self Care | Payer: BC Managed Care – PPO | Source: Ambulatory Visit | Attending: Gastroenterology | Admitting: Gastroenterology

## 2022-12-13 DIAGNOSIS — F17200 Nicotine dependence, unspecified, uncomplicated: Secondary | ICD-10-CM | POA: Diagnosis not present

## 2022-12-13 DIAGNOSIS — R918 Other nonspecific abnormal finding of lung field: Secondary | ICD-10-CM | POA: Diagnosis not present

## 2022-12-13 DIAGNOSIS — R634 Abnormal weight loss: Secondary | ICD-10-CM | POA: Diagnosis not present

## 2022-12-13 MED ORDER — IOHEXOL 300 MG/ML  SOLN
100.0000 mL | Freq: Once | INTRAMUSCULAR | Status: AC | PRN
  Administered 2022-12-13: 100 mL via INTRAVENOUS

## 2022-12-23 ENCOUNTER — Other Ambulatory Visit: Payer: Self-pay | Admitting: Gastroenterology

## 2022-12-23 DIAGNOSIS — R112 Nausea with vomiting, unspecified: Secondary | ICD-10-CM

## 2022-12-26 ENCOUNTER — Other Ambulatory Visit: Payer: Self-pay

## 2022-12-26 ENCOUNTER — Encounter (HOSPITAL_COMMUNITY): Payer: Self-pay | Admitting: Emergency Medicine

## 2022-12-26 ENCOUNTER — Emergency Department (HOSPITAL_COMMUNITY)
Admission: EM | Admit: 2022-12-26 | Discharge: 2022-12-26 | Disposition: A | Discharge status: Home / Self Care | Payer: BC Managed Care – PPO | Attending: Emergency Medicine | Admitting: Emergency Medicine

## 2022-12-26 DIAGNOSIS — R3912 Poor urinary stream: Secondary | ICD-10-CM | POA: Diagnosis not present

## 2022-12-26 DIAGNOSIS — R112 Nausea with vomiting, unspecified: Secondary | ICD-10-CM

## 2022-12-26 DIAGNOSIS — R002 Palpitations: Secondary | ICD-10-CM | POA: Diagnosis not present

## 2022-12-26 DIAGNOSIS — R42 Dizziness and giddiness: Secondary | ICD-10-CM | POA: Insufficient documentation

## 2022-12-26 LAB — COMPREHENSIVE METABOLIC PANEL
ALT: 24 U/L (ref 0–44)
AST: 24 U/L (ref 15–41)
Albumin: 4.4 g/dL (ref 3.5–5.0)
Alkaline Phosphatase: 55 U/L (ref 38–126)
Anion gap: 9 (ref 5–15)
BUN: 13 mg/dL (ref 6–20)
CO2: 26 mmol/L (ref 22–32)
Calcium: 9.1 mg/dL (ref 8.9–10.3)
Chloride: 102 mmol/L (ref 98–111)
Creatinine, Ser: 0.75 mg/dL (ref 0.44–1.00)
GFR, Estimated: >60 mL/min (ref >60)
Glucose, Bld: 110 mg/dL — ABNORMAL HIGH (ref 70–99)
Potassium: 4.3 mmol/L (ref 3.5–5.1)
Sodium: 137 mmol/L (ref 135–145)
Total Bilirubin: 1.4 mg/dL — ABNORMAL HIGH (ref 0.3–1.2)
Total Protein: 7.3 g/dL (ref 6.5–8.1)

## 2022-12-26 LAB — CBC
HCT: 53.6 % — ABNORMAL HIGH (ref 36.0–46.0)
Hemoglobin: 17.4 g/dL — ABNORMAL HIGH (ref 12.0–15.0)
MCH: 33.5 pg (ref 26.0–34.0)
MCHC: 32.5 g/dL (ref 30.0–36.0)
MCV: 103.1 fL — ABNORMAL HIGH (ref 80.0–100.0)
Platelets: 170 10*3/uL (ref 150–400)
RBC: 5.2 MIL/uL — ABNORMAL HIGH (ref 3.87–5.11)
RDW: 12.4 % (ref 11.5–15.5)
WBC: 8.1 10*3/uL (ref 4.0–10.5)
nRBC: 0 % (ref 0.0–0.2)

## 2022-12-26 LAB — URINALYSIS, ROUTINE W REFLEX MICROSCOPIC
Bacteria, UA: NONE SEEN
Bilirubin Urine: NEGATIVE
Glucose, UA: NEGATIVE mg/dL
Hgb urine dipstick: NEGATIVE
Ketones, ur: NEGATIVE mg/dL
Nitrite: NEGATIVE
Protein, ur: NEGATIVE mg/dL
Specific Gravity, Urine: 1.009 (ref 1.005–1.030)
pH: 7 (ref 5.0–8.0)

## 2022-12-26 LAB — LIPASE, BLOOD: Lipase: 31 U/L (ref 11–51)

## 2022-12-26 MED ORDER — ONDANSETRON HCL 4 MG/2ML IJ SOLN
4.0000 mg | Freq: Once | INTRAMUSCULAR | Status: AC
  Administered 2022-12-26: 4 mg via INTRAVENOUS
  Filled 2022-12-26: qty 2

## 2022-12-26 MED ORDER — LACTATED RINGERS IV BOLUS
2000.0000 mL | Freq: Once | INTRAVENOUS | Status: AC
  Administered 2022-12-26: 2000 mL via INTRAVENOUS

## 2022-12-26 NOTE — ED Triage Notes (Signed)
Pt c/o vomiting for 3 days and having heart palpitations.

## 2022-12-26 NOTE — ED Provider Notes (Signed)
[facility] AT [facility] Provider Note   CSN: [id number] Arrival date & time: 12/26/22  1020     History  Chief Complaint  Patient presents with   Emesis    [name] is a 57 y.o. female.  57 year old female with chronic nausea and vomiting who presents with 3 days of nausea and vomiting.  Says it has been nonbloody nonbilious.  Has had 4-5 episodes per day.  No abdominal pain.  Has tried Phenergan and Zofran p.o. at home without improvement of her symptoms.  No diarrhea or fevers.  Has been worked up as an outpatient by her primary doctor and GI who told her that she may have alpha gal.  They think that she may have eaten something to trigger this as it has been 3 weeks since her most recent flare.  Also has been feeling lightheaded with decreased urine output and occasional palpitations.       Home Medications Prior to Admission medications   Medication Sig Start Date End Date Taking? Authorizing Provider  albuterol (PROVENTIL HFA;VENTOLIN HFA) 108 (90 BASE) MCG/ACT inhaler Inhale 2 puffs into the lungs every 6 (six) hours as needed for shortness of breath.     [provider]  ALPRAZolam Prudy Feeler) 0.5 MG tablet Take 0.5 mg by mouth 2 (two) times daily. 04/13/21   [provider]  cariprazine (VRAYLAR) 1.5 MG capsule Take 1.5 mg by mouth daily.    [provider]  ciprofloxacin (CIPRO) 500 MG tablet Take 1 tablet (500 mg total) by mouth every 12 (twelve) hours. Patient not taking: Reported on 10/31/2022 10/20/22   Al Decant, PA-C  famotidine (PEPCID) 40 MG tablet Take 1 tablet (40 mg total) by mouth 2 (two) times daily. 05/02/22   Letta Median, PA-C  fluticasone (FLONASE) 50 MCG/ACT nasal spray Place 1-2 sprays into both nostrils daily.    [provider]  linaclotide Karlene Einstein) 72 MCG capsule Take 1 capsule (72 mcg total) by mouth daily before breakfast. Patient not taking: Reported on 10/31/2022 10/22/22    Tiffany Kocher, PA-C  metoCLOPramide (REGLAN) 10 MG tablet Take 1 tablet (10 mg total) by mouth every 6 (six) hours. 10/17/22   Noralee Stain, DO  metroNIDAZOLE (FLAGYL) 500 MG tablet Take 1 tablet (500 mg total) by mouth 3 (three) times daily. Patient not taking: Reported on 10/31/2022 10/20/22   Al Decant, PA-C  nebivolol (BYSTOLIC) 10 MG tablet Take 10 mg by mouth at bedtime.    [provider]  ondansetron (ZOFRAN) 4 MG tablet Take 1 tablet (4 mg total) by mouth every 6 (six) hours as needed for nausea. 10/17/22   Noralee Stain, DO  spironolactone (ALDACTONE) 25 MG tablet Take 1 tablet (25 mg total) by mouth daily. Patient taking differently: Take 25 mg by mouth at bedtime. 02/05/22 01/31/23  Jodelle Red, MD  sucralfate (CARAFATE) 1 g tablet Take 1 tablet (1 g total) by mouth 4 (four) times daily. 10/17/22 12/16/22  Noralee Stain, DO      Allergies    Bee venom, Motrin [ibuprofen], Codeine, Penicillins, Prilosec [omeprazole], and Zantac [ranitidine]    Review of Systems   Review of Systems  Physical Exam Updated Vital Signs BP 120/77   Pulse 84   Temp 98.4 F (36.9 C) (Oral)   Resp 13   Ht 5\' 2"  (1.575 m)   Wt 54.4 kg   SpO2 100%   BMI 21.95 kg/m  Physical Exam Vitals and  nursing note reviewed.  Constitutional:      General: She is not in acute distress.    Appearance: She is well-developed.  HENT:     Head: Normocephalic and atraumatic.     Right Ear: External ear normal.     Left Ear: External ear normal.     Nose: Nose normal.  Eyes:     Extraocular Movements: Extraocular movements intact.     Conjunctiva/sclera: Conjunctivae normal.     Pupils: Pupils are equal, round, and reactive to light.  Cardiovascular:     Rate and Rhythm: Normal rate and regular rhythm.     Heart sounds: No murmur heard. Pulmonary:     Effort: Pulmonary effort is normal. No respiratory distress.  Abdominal:     General: Abdomen is flat. There is no  distension.     Palpations: Abdomen is soft. There is no mass.     Tenderness: There is no abdominal tenderness. There is no guarding.  Musculoskeletal:     Cervical back: Normal range of motion and neck supple.     Right lower leg: No edema.     Left lower leg: No edema.  Skin:    General: Skin is warm and dry.  Neurological:     Mental Status: She is alert and oriented to person, place, and time. Mental status is at baseline.  Psychiatric:        Mood and Affect: Mood normal.     ED Results / Procedures / Treatments   Labs (all labs ordered are listed, but only abnormal results are displayed) Labs Reviewed  COMPREHENSIVE METABOLIC PANEL - Abnormal; Notable for the following components:      Result Value   Glucose, Bld 110 (*)    Total Bilirubin 1.4 (*)    All other components within normal limits  CBC - Abnormal; Notable for the following components:   RBC 5.20 (*)    Hemoglobin 17.4 (*)    HCT 53.6 (*)    MCV 103.1 (*)    All other components within normal limits  URINALYSIS, ROUTINE W REFLEX MICROSCOPIC - Abnormal; Notable for the following components:   Leukocytes,Ua SMALL (*)    All other components within normal limits  LIPASE, BLOOD    EKG EKG Interpretation  Date/Time:  Thursday December 26 2022 16:03:49 EDT Ventricular Rate:  81 PR Interval:  153 QRS Duration: 97 QT Interval:  376 QTC Calculation: 437 R Axis:   57 Text Interpretation: Sinus rhythm Confirmed by Vonita Moss [phone]) on 12/26/2022 4:09:12 PM  Radiology No results found.  Procedures Procedures   Medications Ordered in ED Medications  lactated ringers bolus 2,000 mL (0 mLs Intravenous Stopped 12/26/22 1835)  ondansetron (ZOFRAN) injection 4 mg (4 mg Intravenous Given 12/26/22 1610)    ED Course/ Medical Decision Making/ A&P                             Medical Decision Making Amount and/or Complexity of Data Reviewed Labs: ordered.  Risk Prescription drug management.   [name] is a 57 y.o. female with comorbidities that complicate the patient evaluation including chronic nausea and vomiting resents emergency department with nausea and vomiting  Initial Ddx:  Chronic nausea and vomiting, alpha-gal, pancreatitis, gastroenteritis, appendicitis, cholecystitis, intra-abdominal abscess  MDM:  Feel the patient's symptoms are likely due to her chronic nausea and vomiting.  This may be related to her alpha-gal which she is  undergoing a workup for it.  Will send off labs to assess for pancreatitis though feel this is less likely.  Also not having fever or diarrhea be suggestive of viral gastroenteritis.  No focal tenderness to palpation on exam or fevers that would be suggestive of any intra-abdominal abscess or infection that would necessitate cross-sectional imaging at this time.  Plan:  Labs Zofran IV fluids  ED Summary/Re-evaluation:  Patient underwent the above workup which was reassuring.  She was feeling much better after the fluids and Zofran.  Tolerated p.o. in the emergency department.  Reports that she has Zofran, Reglan, and Phenergan at home to take and does not need any refill on her antiemetics.  Will have her follow-up with her primary doctor and GI doctor in several days regarding her symptoms.  This patient presents to the ED for concern of complaints listed in HPI, this involves an extensive number of treatment options, and is a complaint that carries with it a high risk of complications and morbidity. Disposition including potential need for admission considered.   Dispo: DC Home. Return precautions discussed including, but not limited to, those listed in the AVS. Allowed pt time to ask questions which were answered fully prior to dc.  Additional history obtained from spouse Records reviewed Outpatient Clinic Notes The following labs were independently interpreted: Chemistry and show no acute abnormality I personally reviewed and interpreted  cardiac monitoring: normal sinus rhythm  I personally reviewed and interpreted the pt's EKG: see above for interpretation  I have reviewed the patients home medications and made adjustments as needed  Final Clinical Impression(s) / ED Diagnoses Final diagnoses:  Nausea and vomiting, unspecified vomiting type    Rx / DC Orders ED Discharge Orders     None         Rondel Baton, MD 12/27/22 0139

## 2022-12-26 NOTE — ED Notes (Signed)
Attempted x 2 for IV access without success.  

## 2022-12-26 NOTE — Discharge Instructions (Signed)
You were seen for your nausea and vomiting in the emergency department.   At home, please take your nausea medicine as prescribed.    Check your MyChart online for the results of any tests that had not resulted by the time you left the emergency department.   Follow-up with your primary doctor in 2-3 days regarding your visit.    Return immediately to the emergency department if you experience any of the following: vomiting despite your medications, severe abdominal pain, or any other concerning symptoms.    Thank you for visiting our Emergency Department. It was a pleasure taking care of you today.

## 2022-12-26 NOTE — ED Notes (Signed)
ED Provider at bedside. 

## 2023-01-01 DIAGNOSIS — H6521 Chronic serous otitis media, right ear: Secondary | ICD-10-CM | POA: Diagnosis not present

## 2023-01-01 DIAGNOSIS — R112 Nausea with vomiting, unspecified: Secondary | ICD-10-CM | POA: Diagnosis not present

## 2023-01-01 DIAGNOSIS — K3184 Gastroparesis: Secondary | ICD-10-CM | POA: Diagnosis not present

## 2023-01-01 DIAGNOSIS — E860 Dehydration: Secondary | ICD-10-CM | POA: Diagnosis not present

## 2023-01-06 ENCOUNTER — Encounter: Payer: BC Managed Care – PPO | Admitting: Gastroenterology

## 2023-01-09 DIAGNOSIS — H903 Sensorineural hearing loss, bilateral: Secondary | ICD-10-CM | POA: Diagnosis not present

## 2023-01-09 DIAGNOSIS — H7201 Central perforation of tympanic membrane, right ear: Secondary | ICD-10-CM | POA: Diagnosis not present

## 2023-01-15 ENCOUNTER — Other Ambulatory Visit (HOSPITAL_COMMUNITY): Payer: Self-pay | Admitting: Adult Health Nurse Practitioner

## 2023-01-15 ENCOUNTER — Telehealth: Payer: Self-pay | Admitting: Cardiology

## 2023-01-15 DIAGNOSIS — K3184 Gastroparesis: Secondary | ICD-10-CM | POA: Diagnosis not present

## 2023-01-15 DIAGNOSIS — Z1231 Encounter for screening mammogram for malignant neoplasm of breast: Secondary | ICD-10-CM

## 2023-01-15 DIAGNOSIS — I951 Orthostatic hypotension: Secondary | ICD-10-CM | POA: Diagnosis not present

## 2023-01-15 NOTE — Telephone Encounter (Signed)
Office called letting provider know she's going to start pt on a low dose of Midodrine based off the episodes pt has been having until she's able to get into the office on her appt day 5/1. If Dr. Cristal Deer disagrees with this decision, she'd like for her to give her a call to discuss other options. Please advise.

## 2023-01-15 NOTE — Telephone Encounter (Signed)
Will forward to Dr Christopher for review  

## 2023-01-16 ENCOUNTER — Other Ambulatory Visit: Payer: Self-pay

## 2023-01-16 ENCOUNTER — Emergency Department (HOSPITAL_COMMUNITY)
Admission: EM | Admit: 2023-01-16 | Discharge: 2023-01-16 | Disposition: A | Discharge status: Home / Self Care | Payer: BC Managed Care – PPO | Attending: Emergency Medicine | Admitting: Emergency Medicine

## 2023-01-16 ENCOUNTER — Emergency Department (HOSPITAL_COMMUNITY): Payer: BC Managed Care – PPO

## 2023-01-16 DIAGNOSIS — I10 Essential (primary) hypertension: Secondary | ICD-10-CM | POA: Insufficient documentation

## 2023-01-16 DIAGNOSIS — Z7951 Long term (current) use of inhaled steroids: Secondary | ICD-10-CM | POA: Diagnosis not present

## 2023-01-16 DIAGNOSIS — R079 Chest pain, unspecified: Secondary | ICD-10-CM | POA: Diagnosis not present

## 2023-01-16 DIAGNOSIS — R55 Syncope and collapse: Secondary | ICD-10-CM

## 2023-01-16 DIAGNOSIS — R0789 Other chest pain: Secondary | ICD-10-CM | POA: Diagnosis not present

## 2023-01-16 DIAGNOSIS — J449 Chronic obstructive pulmonary disease, unspecified: Secondary | ICD-10-CM | POA: Insufficient documentation

## 2023-01-16 LAB — BASIC METABOLIC PANEL
Anion gap: 9 (ref 5–15)
BUN: 10 mg/dL (ref 6–20)
CO2: 27 mmol/L (ref 22–32)
Calcium: 8.7 mg/dL — ABNORMAL LOW (ref 8.9–10.3)
Chloride: 103 mmol/L (ref 98–111)
Creatinine, Ser: 0.66 mg/dL (ref 0.44–1.00)
GFR, Estimated: >60 mL/min (ref >60)
Glucose, Bld: 80 mg/dL (ref 70–99)
Potassium: 3.7 mmol/L (ref 3.5–5.1)
Sodium: 139 mmol/L (ref 135–145)

## 2023-01-16 LAB — D-DIMER, QUANTITATIVE: D-Dimer, Quant: <0.27 ug/mL-FEU (ref 0.00–0.50)

## 2023-01-16 LAB — CBC
HCT: 43.2 % (ref 36.0–46.0)
Hemoglobin: 15 g/dL (ref 12.0–15.0)
MCH: 33.1 pg (ref 26.0–34.0)
MCHC: 34.7 g/dL (ref 30.0–36.0)
MCV: 95.4 fL (ref 80.0–100.0)
Platelets: 214 10*3/uL (ref 150–400)
RBC: 4.53 MIL/uL (ref 3.87–5.11)
RDW: 12.2 % (ref 11.5–15.5)
WBC: 10 10*3/uL (ref 4.0–10.5)
nRBC: 0 % (ref 0.0–0.2)

## 2023-01-16 LAB — TROPONIN I (HIGH SENSITIVITY)
Troponin I (High Sensitivity): <2 ng/L (ref <18)
Troponin I (High Sensitivity): 2 ng/L (ref <18)

## 2023-01-16 MED ORDER — SODIUM CHLORIDE 0.9 % IV BOLUS (SEPSIS)
1000.0000 mL | Freq: Once | INTRAVENOUS | Status: AC
  Administered 2023-01-16: 1000 mL via INTRAVENOUS

## 2023-01-16 MED ORDER — SODIUM CHLORIDE 0.9 % IV SOLN
1000.0000 mL | INTRAVENOUS | Status: DC
  Administered 2023-01-16: 1000 mL via INTRAVENOUS

## 2023-01-16 NOTE — ED Provider Notes (Signed)
EMERGENCY DEPARTMENT AT [facility] Provider Note   CSN: [id number] Arrival date & time: 01/16/23  1410     History  Chief Complaint  Patient presents with   Chest Pain   Loss of Consciousness    [name] is a 57 y.o. female.   Chest Pain Associated symptoms: syncope   Loss of Consciousness Associated symptoms: chest pain      Patient has a history of depression COPD hypertension, TIA and has been having issues with syncope recently.  Patient states over the last week or 2 she has been having episodes where her blood pressure is dropping and she is fainting.  She had an episode initially when she went outside to check on her dog.  She checked her blood pressure when she was feeling lightheaded and it was 89/60.  Patient states she is went to see her primary care doctor.  She was diagnosed with orthostatic hypotension.  She has not coming appointment with cardiology on May 1.  Patient states since yesterday she started having pain in the left side of her chest that is rating to her arm.  It feels like a pressure.  She denies any history of coronary artery disease.  No history of DVT or PE  Home Medications Prior to Admission medications   Medication Sig Start Date End Date Taking? Authorizing Provider  albuterol (PROVENTIL HFA;VENTOLIN HFA) 108 (90 BASE) MCG/ACT inhaler Inhale 2 puffs into the lungs every 6 (six) hours as needed for shortness of breath.    Yes [provider]  ALPRAZolam Prudy Feeler) 0.5 MG tablet Take 0.5 mg by mouth 2 (two) times daily. 04/13/21  Yes [provider]  cariprazine (VRAYLAR) 1.5 MG capsule Take 1.5 mg by mouth daily.   Yes [provider]  famotidine (PEPCID) 40 MG tablet Take 1 tablet (40 mg total) by mouth 2 (two) times daily. 05/02/22  Yes Clearance Coots, Kristen S, PA-C  fluticasone (FLONASE) 50 MCG/ACT nasal spray Place 1-2 sprays into both nostrils daily.   Yes [provider]   ipratropium-albuterol (DUONEB) 0.5-2.5 (3) MG/3ML SOLN Take 3 mLs by nebulization every 6 (six) hours as needed (shortness of breath). 06/05/22  Yes [provider]  metoCLOPramide (REGLAN) 10 MG tablet Take 1 tablet (10 mg total) by mouth every 6 (six) hours. Patient taking differently: Take 5 mg by mouth every 6 (six) hours. 10/17/22  Yes Noralee Stain, DO  ondansetron (ZOFRAN) 4 MG tablet Take 1 tablet (4 mg total) by mouth every 6 (six) hours as needed for nausea. 10/17/22  Yes Noralee Stain, DO  promethazine (PHENERGAN) 12.5 MG tablet Take 12.5-25 mg by mouth every 6 (six) hours as needed. 12/24/22  Yes [provider]  ciprofloxacin (CIPRO) 500 MG tablet Take 1 tablet (500 mg total) by mouth every 12 (twelve) hours. Patient not taking: Reported on 10/31/2022 10/20/22   Al Decant, PA-C  linaclotide [facility]) 72 MCG capsule Take 1 capsule (72 mcg total) by mouth daily before breakfast. Patient not taking: Reported on 10/31/2022 10/22/22   Tiffany Kocher, PA-C  metroNIDAZOLE (FLAGYL) 500 MG tablet Take 1 tablet (500 mg total) by mouth 3 (three) times daily. Patient not taking: Reported on 10/31/2022 10/20/22   Al Decant, PA-C  midodrine (PROAMATINE) 2.5 MG tablet Take 2.5 mg by mouth 2 (two) times daily with a meal. 01/15/23   [provider]  nebivolol (BYSTOLIC) 10 MG tablet Take 10 mg by mouth at bedtime. Patient not  taking: Reported on 01/16/2023    [provider]  spironolactone (ALDACTONE) 25 MG tablet Take 1 tablet (25 mg total) by mouth daily. Patient not taking: Reported on 01/16/2023 02/05/22 01/31/23  Jodelle Red, MD  sucralfate (CARAFATE) 1 g tablet Take 1 tablet (1 g total) by mouth 4 (four) times daily. Patient not taking: Reported on 01/16/2023 10/17/22 12/16/22  Noralee Stain, DO      Allergies    Bee venom, Motrin [ibuprofen], Codeine, Penicillins, Prilosec [omeprazole], and Zantac [ranitidine]    Review of Systems    Review of Systems  Cardiovascular:  Positive for chest pain and syncope.    Physical Exam Updated Vital Signs BP (!) 131/102 (BP Location: Right Arm)   Pulse 78   Temp 98.6 F (37 C) (Oral)   Resp 18   Ht 1.575 m (5\' 2" )   Wt 57.6 kg   SpO2 97%   BMI 23.23 kg/m  Physical Exam Vitals and nursing note reviewed.  Constitutional:      General: She is not in acute distress.    Appearance: She is well-developed.  HENT:     Head: Normocephalic and atraumatic.     Right Ear: External ear normal.     Left Ear: External ear normal.  Eyes:     General: No scleral icterus.       Right eye: No discharge.        Left eye: No discharge.     Conjunctiva/sclera: Conjunctivae normal.  Neck:     Trachea: No tracheal deviation.  Cardiovascular:     Rate and Rhythm: Normal rate and regular rhythm.  Pulmonary:     Effort: Pulmonary effort is normal. No respiratory distress.     Breath sounds: Normal breath sounds. No stridor. No wheezing or rales.  Abdominal:     General: Bowel sounds are normal. There is no distension.     Palpations: Abdomen is soft.     Tenderness: There is no abdominal tenderness. There is no guarding or rebound.  Musculoskeletal:        General: No tenderness or deformity.     Cervical back: Neck supple.  Skin:    General: Skin is warm and dry.     Findings: No rash.  Neurological:     General: No focal deficit present.     Mental Status: She is alert.     Cranial Nerves: No cranial nerve deficit, dysarthria or facial asymmetry.     Sensory: No sensory deficit.     Motor: No abnormal muscle tone or seizure activity.     Coordination: Coordination normal.  Psychiatric:        Mood and Affect: Mood normal.     ED Results / Procedures / Treatments   Labs (all labs ordered are listed, but only abnormal results are displayed) Labs Reviewed  BASIC METABOLIC PANEL - Abnormal; Notable for the following components:      Result Value   Calcium 8.7 (*)    All  other components within normal limits  CBC  D-DIMER, QUANTITATIVE  TROPONIN I (HIGH SENSITIVITY)  TROPONIN I (HIGH SENSITIVITY)    EKG Normal sinus rhythm rate 92 Rightward axis Normal ST-T waves No significant change since last tracing Radiology DG Chest 2 View  Result Date: 01/16/2023 CLINICAL DATA:  Syncopal episode this morning.  Chest pain. EXAM: CHEST - 2 VIEW COMPARISON:  Radiographs 01/01/2022 and 03/07/2021.  CT 12/13/2022. FINDINGS: The heart size and mediastinal contours are stable. The lungs  are hyperinflated with chronic central airway and interstitial thickening. No superimposed edema, confluent airspace opacity or suspicious nodularity identified. There is no pleural effusion or pneumothorax. The bones appear unchanged. Cholecystectomy clips are noted. IMPRESSION: Chronic obstructive pulmonary disease without evidence of acute cardiopulmonary process. Electronically Signed   By: Carey Bullocks M.D.   On: 01/16/2023 15:05    Procedures Procedures    Medications Ordered in ED Medications  sodium chloride 0.9 % bolus 1,000 mL (0 mLs Intravenous Stopped 01/16/23 1650)    Followed by  0.9 %  sodium chloride infusion (1,000 mLs Intravenous New Bag/Given 01/16/23 1652)    ED Course/ Medical Decision Making/ A&P Clinical Course as of 01/16/23 1815  Thu Jan 16, 2023  1544 Chest x-ray without acute findings. [JK]  1743 Troponin normal.  D-dimer negative.  CBC normal metabolic panel normal [JK]    Clinical Course User Index [JK] Linwood Dibbles, MD                             Medical Decision Making Diagnosis includes but not limited to acute coronary syndrome, pulmonary embolism, pneumonia, pneumothorax, anemia, dehydration  Problems Addressed: Chest pain, unspecified type: acute illness or injury that poses a threat to life or bodily functions Syncope, unspecified syncope type: acute illness or injury that poses a threat to life or bodily functions  Amount and/or  Complexity of Data Reviewed Labs: ordered. Decision-making details documented in ED Course. Radiology: ordered and independent interpretation performed.  Risk Prescription drug management. Decision regarding hospitalization.   Patient presented to the ED for evaluation of chest pain as well as recent syncopal events.  ED workup reassuring.  Patient has low risk heart score.  Serial troponins are normal.  Doubt acute coronary syndrome.  No murmurs noted to suggest aortic stenosis.  Patient's D-dimer is negative.  No findings to suggest PE.  Patient is not anemic or dehydrated.  No clear etiology for her chest pain but no signs of serious etiology at this time.  Patient has not had any syncopal episodes here.  No cardiac dysrhythmia noted.  Patient does have plans to see a cardiologist this coming week.  Considered hospitalization but At this time I think she appears stable for close outpatient follow-up.  Will have her continue on aspirin daily.  Warning signs precautions discussed.        Final Clinical Impression(s) / ED Diagnoses Final diagnoses:  Chest pain, unspecified type  Syncope, unspecified syncope type    Rx / DC Orders ED Discharge Orders     None         Linwood Dibbles, MD 01/16/23 1816

## 2023-01-16 NOTE — Discharge Instructions (Signed)
Follow-up with your cardiologist as planned.  Start taking aspirin daily.  Make sure to drink plenty of fluids.  Return to the ED as needed for worsening or recurrent symptoms

## 2023-01-16 NOTE — ED Triage Notes (Signed)
Pt states she had a syncopal episode while sitting on toilet today and thinks she was out for approx 20 mins. Was seen at PCP yesterday and told she had orthostatic hypotension. States heart is hurting and left arm is hurting.

## 2023-01-16 NOTE — Telephone Encounter (Signed)
Note from [place] copied below. Addition of Midodrine reasonable. Has appt to establish with Dr. Cristal Deer 01/22/23.   Alver Sorrow, NP _________________-  Assessment and Plan  1. Syncope due to orthostatic hypotension (Primary) - midodrine HCl (PROAMATINE) 2.5 mg tablet; Take one tablet (2.5 mg dose) by mouth 2 (two) times daily., Starting Wed 01/15/2023, Normal 2. Encounter for screening mammogram for malignant neoplasm of breast - Mammo 3D Tomo Screening Bilateral; Future 3. Gastroparesis   We did call cardiology while at the appointment, May 1 is the soonest appointment but they were agreeable to put her on a wait list. Appreciate their time. Will follow-up after her exam to see if she can return to work on May 10. Follow up in about 4 weeks (around 02/12/2023) for medication recheck.

## 2023-01-21 ENCOUNTER — Ambulatory Visit (HOSPITAL_BASED_OUTPATIENT_CLINIC_OR_DEPARTMENT_OTHER): Payer: BC Managed Care – PPO | Admitting: Cardiology

## 2023-01-22 ENCOUNTER — Encounter (HOSPITAL_BASED_OUTPATIENT_CLINIC_OR_DEPARTMENT_OTHER): Payer: Self-pay | Admitting: Cardiology

## 2023-01-22 ENCOUNTER — Ambulatory Visit (INDEPENDENT_AMBULATORY_CARE_PROVIDER_SITE_OTHER): Payer: BC Managed Care – PPO | Admitting: Cardiology

## 2023-01-22 VITALS — BP 140/90 | HR 88 | Ht 62.0 in | Wt 128.0 lb

## 2023-01-22 DIAGNOSIS — I950 Idiopathic hypotension: Secondary | ICD-10-CM | POA: Diagnosis not present

## 2023-01-22 DIAGNOSIS — R42 Dizziness and giddiness: Secondary | ICD-10-CM | POA: Diagnosis not present

## 2023-01-22 DIAGNOSIS — I479 Paroxysmal tachycardia, unspecified: Secondary | ICD-10-CM | POA: Diagnosis not present

## 2023-01-22 DIAGNOSIS — R55 Syncope and collapse: Secondary | ICD-10-CM

## 2023-01-22 MED ORDER — FLUDROCORTISONE ACETATE 0.1 MG PO TABS
0.1000 mg | ORAL_TABLET | Freq: Every day | ORAL | 11 refills | Status: DC
Start: 2023-01-22 — End: 2023-03-12

## 2023-01-22 NOTE — Progress Notes (Signed)
Cardiology Office Note:    Date:  01/22/2023   ID:  [name], DOB 1966/02/21, MRN [id number]  PCP:  Roe Rutherford, NP  Cardiologist:  Jodelle Red, MD PhD  Referring MD: Benita Stabile, MD   CC: follow up  History of Present Illness:    [name] is a 57 y.o. female with a hx of tobacco use, hypertension who is seen for follow up today. I initially met her 05/25/2019 as a new consult at the request of Benita Stabile, MD for the evaluation and management of dizziness, palpitations, left arm/chest tightness.  We reviewed the lab results as follows: "The catecholamine test is reassuring. Numbers were very normal and some low. This suggests against pheochromocytoma, though there is a small chance that we missed some spikes. We could consider a 24 hour urine test to make sure we aren't missing anything.   The aldosterone/renin test was mixed. The renin level was very low, suggesting that it is being suppressed, but the aldosterone was normal. Typically when we are thinking about hyperaldosteronism, the aldosterone will be much higher and the renin will be low. However, the edarbi can affect these numbers. Because of this, it is more likely that you DO have elevated aldosterone with these numbers while on edarbi."   At her last visit she continued to struggle with uncontrolled nausea and vomiting intermittently. Had about 3 weeks where the nausea/vomiting slowed down. Unfortunately symptoms returned and she went to the ER 10/10/22. Got meds, IV fluids. Weight down to 117 lbs from prior 164 lbs about 6-7 months. Has had upper endoscopy in 04/2022 and lower GI in 06/2022. From a cardiac perspective she was stable. She has had some pre-syncope when feeling really ill, but no full syncope.   Most recently seen in the ED 01/16/2023 with complaints of syncope with hypotension. Previously checked her BP at 89/60 when she was lightheaded. Also complained of left-sided chest pain radiating to her  arm since one day prior. EKG showed NSR at 92 bpm; chest x-ray without acute findings; serial troponins normal; D-dimer negative; CBC and metabolic panels normal. No arrhythmias noted. Continued on aspirin daily.  Today, she is accompanied by her husband. She reports struggling with hypotensive episodes associated with chest pain and syncope. She is losing consciousness daily. Last Tuesday she passed out while in the yard (bumped her shoulder as she fell). Wednesday she passed out in the restroom and fell onto the hamper. She has also passed out for 35 minutes while lying on the couch watching TV, and one time while going to sit down in a chair. Of note, she has had syncopal episodes in the past but we thought they were attributable to dehydration. Since then her episodes are worsening and more frequent in the past 3 weeks. They state that Midodrine seems to be ineffective.  Lately her blood pressure has been very labile. Randomly her blood pressure bottoms out, as low as 89-90 systolic. She has seen her BP dropping 40-50 points after standing up. When her blood pressure is low, her heart rate is in the 100s.   She is experiencing chest pains every day, usually localized to "the lower part of the left side." Her episodes cause a feeling like the left side of her heart is being squeezed, as well as a squeezing on her left arm as if she is wearing a blood pressure cuff. On the day of her most recent ED visit, her pain was more severe:  her heart felt sore and painful as if it was taken out and used to play basketball. Currently in clinic she is experiencing active squeezing chest pain of her left side.  These episodes cause her to subsequently feel fatigued and exhausted. Therefore she sits and rests until her symptoms resolve spontaneously. There are no known alleviating factors. Generally it takes her a while to recover from the exhaustion, then she is good for a while. Her legs were wobbly walking into the  office today as she had a recent episode.   Additionally she complains of frequent urination. Generally, she is eating and drinking well and notes some weight gain.  She denies any peripheral edema, headaches, orthopnea, or PND.  She follows up with her PCP 5/22.  Past Medical History:  Diagnosis Date   Asthma    COPD (chronic obstructive pulmonary disease) (HCC)    Depression    Hypertension    PONV (postoperative nausea and vomiting)    Seasonal allergies    TIA (transient ischemic attack) 01/2022    Past Surgical History:  Procedure Laterality Date   ABDOMINAL HYSTERECTOMY     BIOPSY  05/06/2022   Procedure: BIOPSY;  Surgeon: Lanelle Bal, DO;  [facility];  Service: Endoscopy;;   CHOLECYSTECTOMY     COLONOSCOPY  05/21/2011   Procedure: COLONOSCOPY;  Surgeon: Malissa Hippo, MD;  [facility];  Service: Endoscopy;  Laterality: N/A;  12:30   COLONOSCOPY WITH PROPOFOL N/A 07/23/2022   Procedure: COLONOSCOPY WITH PROPOFOL;  Surgeon: Lanelle Bal, DO;  [facility];  Service: Endoscopy;  Laterality: N/A;  2:00pm, asa 2, pt knows to arrive at 11:30   ESOPHAGEAL DILATION  05/06/2022   Procedure: ESOPHAGEAL DILATION;  Surgeon: Lanelle Bal, DO;  [facility];  Service: Endoscopy;;   ESOPHAGOGASTRODUODENOSCOPY     ESOPHAGOGASTRODUODENOSCOPY (EGD) WITH PROPOFOL N/A 05/06/2022   Procedure: ESOPHAGOGASTRODUODENOSCOPY (EGD) WITH PROPOFOL;  Surgeon: Lanelle Bal, DO;  [facility];  Service: Endoscopy;  Laterality: N/A;  8:00AM   POLYPECTOMY  07/23/2022   Procedure: POLYPECTOMY;  Surgeon: Lanelle Bal, DO;  [facility];  Service: Endoscopy;;   TUBAL LIGATION      Current Medications: Current Outpatient Medications on File Prior to Visit  Medication Sig   albuterol (PROVENTIL HFA;VENTOLIN HFA) 108 (90 BASE) MCG/ACT inhaler Inhale 2 puffs into the lungs every 6 (six) hours as needed for shortness  of breath.    ALPRAZolam (XANAX) 0.5 MG tablet Take 0.5 mg by mouth 2 (two) times daily.   metoCLOPramide (REGLAN) 10 MG tablet Take 1 tablet (10 mg total) by mouth every 6 (six) hours. (Patient taking differently: Take 5 mg by mouth every 6 (six) hours.)   No current facility-administered medications on file prior to visit.     Allergies:   Bee venom, Motrin [ibuprofen], Codeine, Penicillins, Prilosec [omeprazole], and Zantac [ranitidine]   Social History   Tobacco Use   Smoking status: Every Day    Packs/day: 0.50    Years: 23.00    Additional pack years: 0.00    Total pack years: 11.50    Types: Cigarettes    Passive exposure: Current   Smokeless tobacco: Never   Tobacco comments:    Smoking maybe 3 cigarettes per day---01/22/23 TW  Vaping Use   Vaping Use: Every day  Substance Use Topics   Alcohol use: No   Drug use: No  Family History: The patient's family history includes Colon cancer in an other family member; Hyperlipidemia in her mother; Hypertension in her mother; Kidney cancer in her father; Stroke in her mother. mother had a stroke at age 90, Mat Gma had CHF, Griselda Miner had heart and blood pressure problems. Everyone else has cancer.  ROS:   Please see the history of present illness.   (+) Syncope (+) Left sided chest pain/pressure with left arm pressure (+) Fatigue (+) Weakness (+) Urinary frequency Additional pertinent ROS otherwise unremarkable.   EKGs/Labs/Other Studies Reviewed:    The following studies were reviewed today:  CT Chest  12/13/2022: IMPRESSION: Few tiny bilateral lung nodules identified measuring under 5 mm. No follow-up needed if patient is low-risk (and has no known or suspected primary neoplasm). Non-contrast chest CT can be considered in 12 months if patient is high-risk. This recommendation follows the consensus statement: Guidelines for Management of Incidental Pulmonary Nodules Detected on CT Images: From the Fleischner  Society 2017; Radiology 2017; 284:228-243.   No developing other mass lesion, fluid collection or lymph node enlargement in the thorax.   Fatty liver infiltration.   Coronary artery calcifications. Please correlate for other coronary risk factors.  Echo 01/02/22  1. Left ventricular ejection fraction, by estimation, is 50 to 55%. The  left ventricle has low normal function. The left ventricle has no regional  wall motion abnormalities. Left ventricular diastolic parameters were  normal.   2. Right ventricular systolic function is normal. The right ventricular  size is normal. There is normal pulmonary artery systolic pressure.   3. The mitral valve is normal in structure. No evidence of mitral valve  regurgitation. No evidence of mitral stenosis.   4. The aortic valve is tricuspid. Aortic valve regurgitation is not  visualized. No aortic stenosis is present.   5. The inferior vena cava is normal in size with greater than 50%  respiratory variability, suggesting right atrial pressure of 3 mmHg.   Comparison(s): No prior Echocardiogram.   CTA Head/Neck 01/01/22 IMPRESSION: Patent vasculature of the head and neck with no significant stenosis or occlusion.  CT Head 01/01/22 IMPRESSION: 1. No acute intracranial pathology. 2. ASPECTS is 10 3. Empty sella. This finding is nonspecific and could reflect a normal variant common but can also be seen in the setting of intracranial hypertension.  Blood Pressure Monitor 07/09/21 24 ambulatory summary: Mean BP 132/77, HR 81 Awake average 140/84, HR 82 Asleep average 111/62, HR 78   Dip with sleep: systolic 26%, diastolic 20%   Max systolic: 167 Max diastolic: 102   Normal BP variability, average BP near goal.  CT coronary 06/16/2019 FINDINGS: Coronary calcium score: The patient's coronary artery calcium score is 0, which places the patient in the 0 percentile.   Coronary arteries: Normal coronary origins.  Right dominance.    Right Coronary Artery: Medium caliber vessel that give rise to PLB and PDA. No significant plaque or stenosis.   Left Main Coronary Artery: No significant plaque or stenosis.   Left Anterior Descending Coronary Artery: Gives rise to two medium sized diagonals. No significant plaque or stenosis in LAD or branch vessels.   Left Circumflex Artery: Gives rise to two large OM branches. No significant plaque or stenosis in LCx or branch vessels.   Aorta: Normal size, 27 mm at the mid ascending aorta (level of the PA bifurcation) measured double oblique. No calcifications. No dissection.   Aortic Valve: No calcifications. Trileaflet.   Other findings:  Normal pulmonary  vein drainage into the left atrium.  Normal left atrial appendage without a thrombus.  Normal size of the pulmonary artery.   IMPRESSION: 1. No evidence of CAD, CADRADS = 0.  2. Coronary calcium score of 0. This was 0 percentile for age and sex matched control.  3. Normal coronary origin with right dominance.  Event monitor 08/30/2019 12 days of data recorded on Zio monitor. Patient had a min HR of 58 bpm, max HR of 184 bpm, and avg HR of 82 bpm. Predominant underlying rhythm was Sinus Rhythm. No VT, atrial fibrillation, high degree block, or pauses noted. Isolated atrial and ventricular ectopy was rare (<1%). There were 19 triggered events. There were 6 SVT events noted, all very brief (longest 5 beats). These were detected near patient triggered event markers.  Carotid u/s 06/07/2019 RIGHT CAROTID ARTERY: No significant calcifications of the right common carotid artery. Intermediate waveform maintained. Heterogeneous and partially calcified plaque at the right carotid bifurcation. No significant lumen shadowing. Low resistance waveform of the right ICA. No significant tortuosity.   RIGHT VERTEBRAL ARTERY: Antegrade flow with low resistance waveform.   LEFT CAROTID ARTERY: No significant calcifications of the  left common carotid artery. Intermediate waveform maintained. Heterogeneous and partially calcified plaque at the left carotid bifurcation without significant lumen shadowing. Low resistance waveform of the left ICA. No significant tortuosity.   LEFT VERTEBRAL ARTERY:  Antegrade flow with low resistance waveform.   IMPRESSION: Color duplex indicates minimal heterogeneous and calcified plaque, with no hemodynamically significant stenosis by duplex criteria in the extracranial cerebrovascular circulation.  EKG:  EKG was personally reviewed today. 01/22/2023:  NSR at 88 bpm 10/02/22: not ordered 09/06/2022: EKG was not ordered.  04/15/2022: EKG was not ordered. 01/10/2022: not ordered 03/07/21 NSR 81 bpm  Recent Labs: 10/14/2022: TSH 1.106 11/13/2022: Magnesium 2.0 12/26/2022: ALT 24 01/16/2023: BUN 10; Creatinine, Ser 0.66; Hemoglobin 15.0; Platelets 214; Potassium 3.7; Sodium 139   Recent Lipid Panel    Component Value Date/Time   CHOL 144 01/02/2022 0447   TRIG 86 01/02/2022 0447   HDL 48 01/02/2022 0447   CHOLHDL 3.0 01/02/2022 0447   VLDL 17 01/02/2022 0447   LDLCALC 79 01/02/2022 0447    Physical Exam:    VS:  BP (!) 140/90   Pulse 88   Ht 5\' 2"  (1.575 m)   Wt 128 lb (58.1 kg)   BMI 23.41 kg/m     Wt Readings from Last 3 Encounters:  01/22/23 128 lb (58.1 kg)  01/16/23 127 lb (57.6 kg)  12/26/22 120 lb (54.4 kg)    GEN: Well nourished, well developed in no acute distress HEENT: Normal, moist mucous membranes NECK: No JVD CARDIAC: regular rhythm, normal S1 and S2, no rubs or gallops. No murmur. VASCULAR: Radial and DP pulses 2+ bilaterally. No carotid bruits RESPIRATORY:  Largely clear, slight wheezing bilaterally ABDOMEN: Soft, non-tender, non-distended MUSCULOSKELETAL:  Ambulates independently SKIN: Warm and dry, no edema NEUROLOGIC:  Alert and oriented x 3. No focal neuro deficits noted. PSYCHIATRIC:  Normal affect    ASSESSMENT:    1. Syncope and collapse    2. Idiopathic hypotension   3. Orthostatic lightheadedness   4. Tachycardia, paroxysmal (HCC)    PLAN:    Transient neuro symptoms with elevated BP Labile hypertension with more hypotension recently Syncope -off all antihypertensives -this is a stark change--initially presented with very elevated BP -I am unclear as to the etiology of this, but given that no evaluation thus far has found  a cause, need to consider rare syndromes (adrenal insufficiency, hormonal issues, autoimmune, paraneoplastic, etc) -will repeat echo to make sure no structural changes -midodrine not helping symptoms. Will stop this, trial florinef.   Unintentional weight loss--slowly stabilizing -unclear etiology of this, being evaluated by GI -poor oral nutrition may also be affecting her intermittent hypotension, but overall concern is for an unclear systemic process  Palpitations Intermittent tachycardia -with worsening clinical status, will repeat monitor  Tobacco use counseling: working to cut back  CV prevention: -calcium score 0, no CAD on CT 2020 -she has stopped aspirin, atorvastatin. Ok to remain off of these, can reassess in the future  Plan for follow up: 5-6 weeks, or sooner as needed.  Total time of encounter: 46 minutes total time of encounter, including 41 minutes spent in face-to-face patient care. This time includes coordination of care and counseling regarding above conditions. Remainder of non-face-to-face time involved reviewing chart documents/testing relevant to the patient encounter and documentation in the medical record.  Jodelle Red, MD, PhD, [facility]   [facility]    Medication Adjustments/Labs and Tests Ordered: Current medicines are reviewed at length with the patient today.  Concerns regarding medicines are outlined above.   Orders Placed This Encounter  Procedures   ECHOCARDIOGRAM COMPLETE   Meds ordered this encounter  Medications   fludrocortisone  (FLORINEF) 0.1 MG tablet    Sig: Take 1 tablet (0.1 mg total) by mouth daily.    Dispense:  30 tablet    Refill:  11   Patient Instructions  Stop midodrine for now as it is not helping. We will try florinef to see if that helps. I think you need a workup for hormonal changes (adrenals, etc) and maybe autoimmune. We will also repeat the ultrasound of the heart and the heart monitor to make sure these are still normal.    I,Mathew Stumpf,acting as a scribe for [employer], MD.,have documented all relevant documentation on the behalf of Jodelle Red, MD,as directed by  Jodelle Red, MD while in the presence of Jodelle Red, MD.  I, Jodelle Red, MD, have reviewed all documentation for this visit. The documentation on 03/13/23 for the exam, diagnosis, procedures, and orders are all accurate and complete.   Signed, Jodelle Red, MD PhD 01/22/2023     [facility]

## 2023-01-22 NOTE — Patient Instructions (Addendum)
Medication Instructions:  Stop midodrine for now as it is not helping. We will try florinef to see if that helps.   *If you need a refill on your cardiac medications before your next appointment, please call your pharmacy*   Lab Work: None   Testing/Procedures: Your physician has requested that you have an echocardiogram. Echocardiography is a painless test that uses sound waves to create images of your heart. It provides your doctor with information about the size and shape of your heart and how well your heart's chambers and valves are working. This procedure takes approximately one hour. There are no restrictions for this procedure. Please do NOT wear cologne, perfume, aftershave, or lotions (deodorant is allowed). Please arrive 15 minutes prior to your appointment time.   ZIO AT Long term monitor-Live Telemetry  Your physician has requested you wear a ZIO patch monitor for 14 days.  This is a single patch monitor. [employer] supplies one patch monitor per enrollment. Additional  stickers are not available.  Please do not apply patch if you will be having a Nuclear Stress Test, Echocardiogram, Cardiac CT, MRI,  or Chest Xray during the period you would be wearing the monitor. The patch cannot be worn during  these tests. You cannot remove and re-apply the ZIO AT patch monitor.  Your ZIO patch monitor will be mailed 3 day USPS to your address on file. It may take 3-5 days to  receive your monitor after you have been enrolled.  Once you have received your monitor, please review the enclosed instructions. Your monitor has  already been registered assigning a specific monitor serial # to you.   Billing and Patient Assistance Program information  Meredeth Ide has been supplied with any insurance information on record for billing. [employer] offers a sliding scale Patient Assistance Program for patients without insurance, or whose  insurance does not completely cover the cost of the ZIO patch  monitor. You must apply for the  Patient Assistance Program to qualify for the discounted rate. To apply, call [employer] at [phone],  select option 4, select option 2 , ask to apply for the Patient Assistance Program, (you can request an  interpreter if needed). [employer] will ask your household income and how many people are in your  household. [employer] will quote your out-of-pocket cost based on this information. They will also be able  to set up a 12 month interest free payment plan if needed.  Applying the monitor   Shave hair from upper left chest.  Hold the abrader disc by orange tab. Rub the abrader in 40 strokes over left upper chest as indicated in  your monitor instructions.  Clean area with 4 enclosed alcohol pads. Use all pads to ensure the area is cleaned thoroughly. Let  dry.  Apply patch as indicated in monitor instructions. Patch will be placed under collarbone on left side of  chest with arrow pointing upward.  Rub patch adhesive wings for 2 minutes. Remove the white label marked "1". Remove the white label  marked "2". Rub patch adhesive wings for 2 additional minutes.  While looking in a mirror, press and release button in center of patch. A small green light will flash 3-4  times. This will be your only indicator that the monitor has been turned on.  Do not shower for the first 24 hours. You may shower after the first 24 hours.  Press the button if you feel a symptom. You will hear a small click. Record Date, Time  and Symptom in  the Patient Log.   Starting the Gateway  In your kit there is a Audiological scientist box the size of a cellphone. This is Buyer, retail. It transmits all your  recorded data to [facility]. This box must always stay within 10 feet of you. Open the box and push the *  button. There will be a light that blinks orange and then green a few times. When the light stops  blinking, the Gateway is connected to the ZIO patch. Call [employer] at [phone] to  confirm your monitor is transmitting.  Returning your monitor  Remove your patch and place it inside the Gateway. In the lower half of the Gateway there is a white  bag with prepaid postage on it. Place Gateway in bag and seal. Mail package back to [place] as soon as  possible. Your physician should have your final report approximately 7 days after you have mailed back  your monitor. Call [facility] Customer Care at [phone] if you have questions regarding your ZIO AT  patch monitor. Call them immediately if you see an orange light blinking on your monitor.  If your monitor falls off in less than 4 days, contact our Monitor department at [phone]. If your  monitor becomes loose or falls off after 4 days call [employer] at [phone] for suggestions on  securing your monitor    Follow-Up: At [facility], you and your health needs are our priority.  As part of our continuing mission to provide you with exceptional heart care, we have created designated Provider Care Teams.  These Care Teams include your primary Cardiologist (physician) and Advanced Practice Providers (APPs -  Physician Assistants and Nurse Practitioners) who all work together to provide you with the care you need, when you need it.  We recommend signing up for the patient portal called "MyChart".  Sign up information is provided on this After Visit Summary.  MyChart is used to connect with patients for Virtual Visits (Telemedicine).  Patients are able to view lab/test results, encounter notes, upcoming appointments, etc.  Non-urgent messages can be sent to your provider as well.   To learn more about what you can do with MyChart, go to [email or web address].    Your next appointment:   6 week(s)  Provider:   Jodelle Red, MD    Other Instructions  I think you need a workup for hormonal changes (adrenals, etc) and maybe autoimmune. We will also repeat the ultrasound of the heart and  the heart monitor to make sure these are still normal.

## 2023-01-27 ENCOUNTER — Telehealth: Payer: Self-pay | Admitting: Cardiology

## 2023-01-27 DIAGNOSIS — I951 Orthostatic hypotension: Secondary | ICD-10-CM | POA: Diagnosis not present

## 2023-01-27 DIAGNOSIS — E2740 Unspecified adrenocortical insufficiency: Secondary | ICD-10-CM | POA: Diagnosis not present

## 2023-01-27 DIAGNOSIS — F411 Generalized anxiety disorder: Secondary | ICD-10-CM | POA: Diagnosis not present

## 2023-01-27 NOTE — Telephone Encounter (Signed)
Transferred call from call center.   PCP office calling, we saw her 5/1, pcp on 5/24 ED on 5/25. Was started on florinef. In PCP office today due to syncopal episode in the bank. [employer] in office today are 140/82, 100/72. They are extending her work leave. Other than fluids they are at a loss of what else to do for the patient. She states the patient already comes into the office about every other week for fluids. She is aware of the plans for cardiac workup, but just wanted to know if there was anything else that needs to be done. Can Call her back on her cell at [phone].   Routing and printing for Dr. Cristal Deer for review.

## 2023-01-27 NOTE — Telephone Encounter (Signed)
Arturo Morton, NP asking to speak to Dr. Cristal Deer about pt medications.

## 2023-01-27 NOTE — Telephone Encounter (Signed)
Called and spoke to [facility], discussed.

## 2023-01-27 NOTE — Telephone Encounter (Signed)
Also printed.

## 2023-01-29 ENCOUNTER — Ambulatory Visit: Payer: BC Managed Care – PPO | Admitting: Internal Medicine

## 2023-02-04 ENCOUNTER — Ambulatory Visit (HOSPITAL_COMMUNITY)
Admission: RE | Admit: 2023-02-04 | Discharge: 2023-02-04 | Disposition: A | Discharge status: Home / Self Care | Payer: BC Managed Care – PPO | Source: Ambulatory Visit | Attending: Cardiology | Admitting: Cardiology

## 2023-02-04 DIAGNOSIS — R55 Syncope and collapse: Secondary | ICD-10-CM | POA: Insufficient documentation

## 2023-02-04 LAB — ECHOCARDIOGRAM COMPLETE
Area-P 1/2: 3.1 cm2
Calc EF: 56.9 %
MV M vel: 3.94 m/s
MV Peak grad: 62.1 mmHg
S' Lateral: 3.4 cm
Single Plane A2C EF: 53.6 %
Single Plane A4C EF: 59.9 %

## 2023-02-04 NOTE — Progress Notes (Signed)
  Echocardiogram 2D Echocardiogram has been performed.  [name] 02/04/2023, 9:20 AM

## 2023-02-05 IMAGING — DX DG CHEST 1V PORT
1 series · 1 of 1 positions shown · non-contrast
Comparison: 08/01/2020

CLINICAL DATA: Hypertension and chest pain

EXAM:
PORTABLE CHEST 1 VIEW

[chest ap]
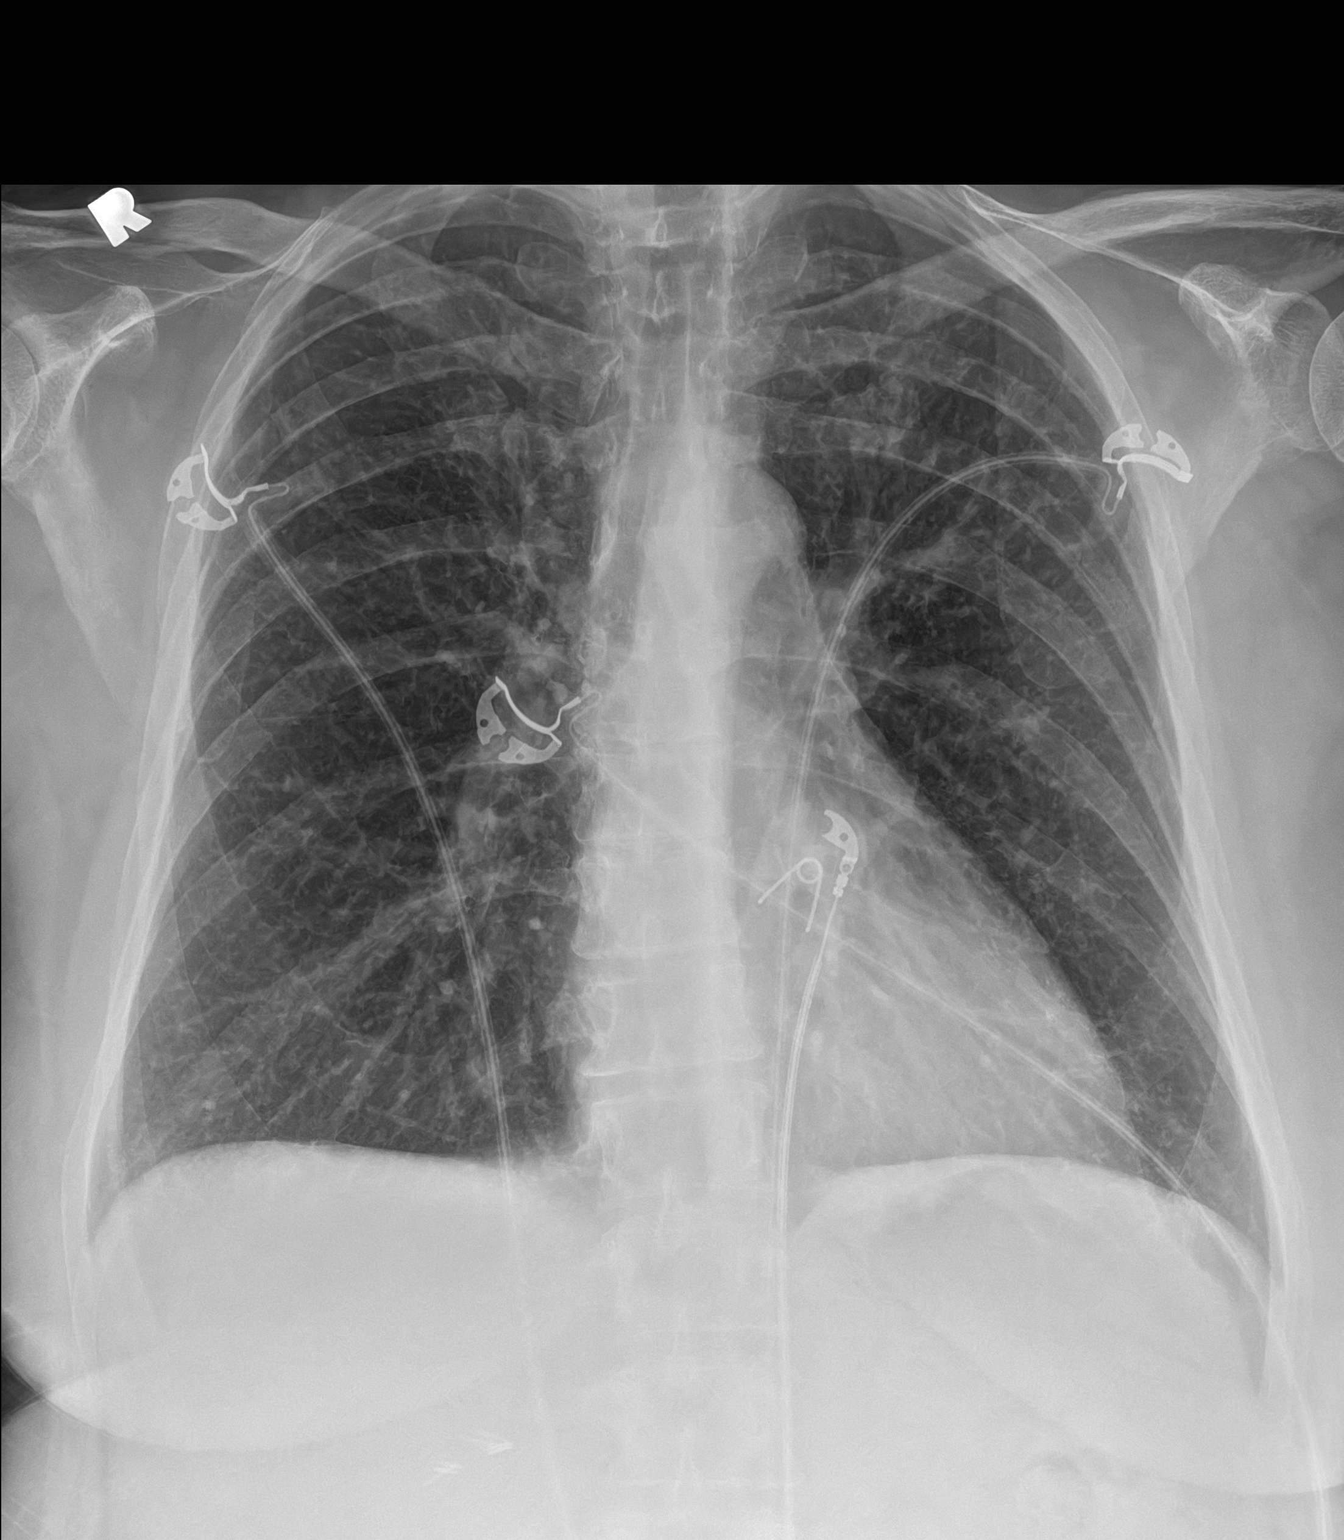

[1 of 1 positions shown; findings below may reference images not displayed]

FINDINGS: Cardiac shadow is within normal limits. The lungs are well aerated
bilaterally. No focal infiltrate or effusion is seen. No acute bony
abnormality is noted.
IMPRESSION: No active disease.

## 2023-02-12 DIAGNOSIS — K3184 Gastroparesis: Secondary | ICD-10-CM | POA: Diagnosis not present

## 2023-02-12 DIAGNOSIS — I951 Orthostatic hypotension: Secondary | ICD-10-CM | POA: Diagnosis not present

## 2023-02-12 DIAGNOSIS — F411 Generalized anxiety disorder: Secondary | ICD-10-CM | POA: Diagnosis not present

## 2023-02-26 ENCOUNTER — Other Ambulatory Visit (HOSPITAL_BASED_OUTPATIENT_CLINIC_OR_DEPARTMENT_OTHER): Payer: BC Managed Care – PPO

## 2023-03-04 ENCOUNTER — Ambulatory Visit (HOSPITAL_BASED_OUTPATIENT_CLINIC_OR_DEPARTMENT_OTHER): Payer: BC Managed Care – PPO | Admitting: Cardiology

## 2023-03-05 ENCOUNTER — Ambulatory Visit
Admission: EM | Admit: 2023-03-05 | Discharge: 2023-03-05 | Disposition: A | Discharge status: Home / Self Care | Payer: BC Managed Care – PPO

## 2023-03-05 DIAGNOSIS — R112 Nausea with vomiting, unspecified: Secondary | ICD-10-CM | POA: Diagnosis not present

## 2023-03-05 DIAGNOSIS — K3184 Gastroparesis: Secondary | ICD-10-CM | POA: Diagnosis not present

## 2023-03-05 LAB — POCT URINALYSIS DIP (MANUAL ENTRY)
Bilirubin, UA: NEGATIVE
Glucose, UA: NEGATIVE mg/dL
Ketones, POC UA: NEGATIVE mg/dL
Leukocytes, UA: NEGATIVE
Nitrite, UA: NEGATIVE
Protein Ur, POC: NEGATIVE mg/dL
Spec Grav, UA: >=1.03 — AB (ref 1.010–1.025)
Urobilinogen, UA: 0.2 E.U./dL
pH, UA: 5.5 (ref 5.0–8.0)

## 2023-03-05 MED ORDER — ONDANSETRON 4 MG PO TBDP: 4.0000 mg | ORAL_TABLET | Freq: Once | ORAL | Status: DC

## 2023-03-05 NOTE — ED Provider Notes (Signed)
[facility]    CSN: [id number] Arrival date & time: 03/05/23  0934      History   Chief Complaint No chief complaint on file.   HPI [name] is a 57 y.o. female.   The history is provided by the patient.   Patient presents for complaints of nausea and vomiting that is been occurring over the past week, along with decreased urinary output and weakness.  Patient reports she is vomiting approximately 5-6 times per day.  She states over the past 2 to 3 days, she is only urinating 1 time per day.  Patient reports a prior history of orthostatic hypotension.  She was on Florinef, but that was stopped by her PCP office approximately 1 month ago.  She states that she takes Reglan 3 times a day, but that is no longer helping her nausea and vomiting.  She reports she has also been taking Phenergan.  Patient also reports history of Alpha gal.  Patient complains of dizziness and lightheadedness.  She denies fever, chills, chest pain, abdominal pain, diarrhea, constipation, or urinary symptoms..  The patient states that she has lost her appetite, and has become unable to eat or drink.  States that she did call her PCP office, but she was on vacation.  Past Medical History:  Diagnosis Date   Asthma    COPD (chronic obstructive pulmonary disease) (HCC)    Depression    Hypertension    PONV (postoperative nausea and vomiting)    Seasonal allergies    TIA (transient ischemic attack) 01/2022    Patient Active Problem List   Diagnosis Date Noted   Diverticulitis of colon without hemorrhage 10/22/2022   Constipation 10/22/2022   Barrett esophagus 10/15/2022   Intractable vomiting with nausea 10/15/2022   Hypokalemia 10/13/2022   Hypocalcemia 10/13/2022   Bipolar disorder (HCC) 10/13/2022   Intractable nausea and vomiting 05/02/2022   Loss of weight 05/02/2022   Abdominal pain, epigastric 05/02/2022   Liver lesion 05/02/2022   Current smoker 01/09/2022   Gastroesophageal  reflux disease    TIA (transient ischemic attack) 01/01/2022   Paroxysmal SVT (supraventricular tachycardia) 08/30/2019   Palpitation 05/25/2019   Precordial pain 05/25/2019   Essential hypertension 05/25/2019   Tobacco abuse counseling 05/25/2019    Past Surgical History:  Procedure Laterality Date   ABDOMINAL HYSTERECTOMY     BIOPSY  05/06/2022   Procedure: BIOPSY;  Surgeon: Lanelle Bal, DO;  [facility]: [facility];  Service: Endoscopy;;   CHOLECYSTECTOMY     COLONOSCOPY  05/21/2011   Procedure: COLONOSCOPY;  Surgeon: Malissa Hippo, MD;  Location: [facility];  Service: Endoscopy;  Laterality: N/A;  12:30   COLONOSCOPY WITH PROPOFOL N/A 07/23/2022   Procedure: COLONOSCOPY WITH PROPOFOL;  Surgeon: Lanelle Bal, DO;  Location: [facility];  Service: Endoscopy;  Laterality: N/A;  2:00pm, asa 2, pt knows to arrive at 11:30   ESOPHAGEAL DILATION  05/06/2022   Procedure: ESOPHAGEAL DILATION;  Surgeon: Lanelle Bal, DO;  Location: [facility];  Service: Endoscopy;;   ESOPHAGOGASTRODUODENOSCOPY     ESOPHAGOGASTRODUODENOSCOPY (EGD) WITH PROPOFOL N/A 05/06/2022   Procedure: ESOPHAGOGASTRODUODENOSCOPY (EGD) WITH PROPOFOL;  Surgeon: Lanelle Bal, DO;  Location: [facility];  Service: Endoscopy;  Laterality: N/A;  8:00AM   POLYPECTOMY  07/23/2022   Procedure: POLYPECTOMY;  Surgeon: Lanelle Bal, DO;  Location: [facility];  Service: Endoscopy;;   TUBAL LIGATION      OB History  No obstetric history on file.      Home Medications    Prior to Admission medications   Medication Sig Start Date End Date Taking? Authorizing Provider  sertraline (ZOLOFT) 50 MG tablet Take by mouth. 02/12/23  Yes [provider]  albuterol (PROVENTIL HFA;VENTOLIN HFA) 108 (90 BASE) MCG/ACT inhaler Inhale 2 puffs into the lungs every 6 (six) hours as needed for shortness of breath.     [provider]  ALPRAZolam Prudy Feeler) 0.5 MG tablet Take 0.5 mg by mouth 2  (two) times daily. 04/13/21   [provider]  fludrocortisone (FLORINEF) 0.1 MG tablet Take 1 tablet (0.1 mg total) by mouth daily. 01/22/23   Jodelle Red, MD  metoCLOPramide (REGLAN) 10 MG tablet Take 1 tablet (10 mg total) by mouth every 6 (six) hours. Patient taking differently: Take 5 mg by mouth every 6 (six) hours. 10/17/22   Noralee Stain, DO    Family History Family History  Problem Relation Age of Onset   Hypertension Mother    Hyperlipidemia Mother    Stroke Mother    Kidney cancer Father    Colon cancer Other     Social History Social History   Tobacco Use   Smoking status: Every Day    Packs/day: 0.50    Years: 23.00    Additional pack years: 0.00    Total pack years: 11.50    Types: Cigarettes    Passive exposure: Current   Smokeless tobacco: Never   Tobacco comments:    Smoking maybe 3 cigarettes per day---01/22/23 TW  Vaping Use   Vaping Use: Every day  Substance Use Topics   Alcohol use: No   Drug use: No     Allergies   Bee venom, Motrin [ibuprofen], Codeine, Penicillins, Prilosec [omeprazole], and Zantac [ranitidine]   Review of Systems Review of Systems Per HPI  Physical Exam Triage Vital Signs ED Triage Vitals [03/05/23 0941]  Enc Vitals Group     BP 129/83     Pulse Rate 75     Resp 20     Temp 98.2 F (36.8 C)     Temp Source Oral     SpO2 93 %     Weight      Height      Head Circumference      Peak Flow      Pain Score 0     Pain Loc      Pain Edu?      Excl. in GC?    Orthostatic VS for the past 24 hrs:  BP- Lying Pulse- Lying BP- Sitting Pulse- Sitting BP- Standing at 0 minutes Pulse- Standing at 0 minutes  03/05/23 0958 146/90 89 132/85 95 139/84 102    Updated Vital Signs BP 129/83 (BP Location: Right Arm)   Pulse 75   Temp 98.2 F (36.8 C) (Oral)   Resp 20   SpO2 93%   Visual Acuity Right Eye Distance:   Left Eye Distance:   Bilateral Distance:    Right Eye Near:   Left Eye Near:     Bilateral Near:     Physical Exam Vitals and nursing note reviewed.  Constitutional:      General: She is not in acute distress.    Appearance: Normal appearance.  HENT:     Head: Normocephalic.  Eyes:     Extraocular Movements: Extraocular movements intact.     Conjunctiva/sclera: Conjunctivae normal.     Pupils: Pupils are equal, round, and  reactive to light.  Cardiovascular:     Rate and Rhythm: Normal rate and regular rhythm.     Pulses: Normal pulses.     Heart sounds: Normal heart sounds.  Pulmonary:     Effort: Pulmonary effort is normal. No respiratory distress.     Breath sounds: Normal breath sounds. No stridor. No wheezing, rhonchi or rales.  Abdominal:     General: Bowel sounds are normal.     Palpations: Abdomen is soft.     Tenderness: There is no abdominal tenderness.  Musculoskeletal:     Cervical back: Normal range of motion.  Lymphadenopathy:     Cervical: No cervical adenopathy.  Skin:    General: Skin is warm and dry.  Neurological:     General: No focal deficit present.     Mental Status: She is alert and oriented to person, place, and time.  Psychiatric:        Mood and Affect: Mood normal.        Behavior: Behavior normal.      UC Treatments / Results  Labs (all labs ordered are listed, but only abnormal results are displayed) Labs Reviewed  POCT URINALYSIS DIP (MANUAL ENTRY) - Abnormal; Notable for the following components:      Result Value   Spec Grav, UA >=1.030 (*)    Blood, UA trace-intact (*)    All other components within normal limits    EKG   Radiology No results found.  Procedures Procedures (including critical care time)  Medications Ordered in UC Medications - No data to display  Initial Impression / Assessment and Plan / UC Course  I have reviewed the triage vital signs and the nursing notes.  Pertinent labs & imaging results that were available during my care of the patient were reviewed by me and considered in  my medical decision making (see chart for details)  The patient's vital signs are stable, she is in no acute distress.  Urinalysis does show patient is dehydrated, ketones are within normal limits, urine is clear.  She is also negative for orthostatic hypotension.  Symptoms do not appear to be consistent with acute abdomen as symptoms have been present for 5 days, patient's vital signs are stable, she is afebrile, and has no abdominal tenderness.  Given these findings, patient was advised to continue the prescriptions that she has at home for nausea and vomiting to include Reglan and Phenergan.  Patient advised to begin a clear liquid diet along with eating ice chips to prevent dehydration.  Discussed use of Pedialyte with the patient.  Patient was given strict ER follow-up precautions.  Patient is in agreement with this plan of care and verbalizes understanding.  All questions were answered.  Patient is stable for discharge.   Final Clinical Impressions(s) / UC Diagnoses   Final diagnoses:  Nausea and vomiting, unspecified vomiting type  Gastroparesis     Discharge Instructions      Continue the Reglan and Phenergan you currently have been prescribed. May take over-the-counter Tylenol as needed for pain, fever, general discomfort. Recommend eating ice chips to help prevent dehydration.  You can also drink small sips of water. Recommend a clear liquid diet while symptoms persist.  This includes broth, popsicles, ginger ale, Sprite, or black coffee. If you develop new symptoms to include fever, chills, with worsening nausea or vomiting, or become unable to keep down any liquids, please go to the emergency department immediately. Keep scheduled appointment with your primary care physician  for next week. Follow-up as needed.     ED Prescriptions   None    PDMP not reviewed this encounter.   Abran Cantor, NP 03/05/23 1114

## 2023-03-05 NOTE — ED Triage Notes (Signed)
Pt reports she has been vomiting x 1 week. Pt states she is weak, urinating less, and fatigue.

## 2023-03-05 NOTE — Discharge Instructions (Addendum)
Continue the Reglan and Phenergan you currently have been prescribed. May take over-the-counter Tylenol as needed for pain, fever, general discomfort. Recommend eating ice chips to help prevent dehydration.  You can also drink small sips of water. Recommend a clear liquid diet while symptoms persist.  This includes broth, popsicles, ginger ale, Sprite, or black coffee. If you develop new symptoms to include fever, chills, with worsening nausea or vomiting, or become unable to keep down any liquids, please go to the emergency department immediately. Keep scheduled appointment with your primary care physician for next week. Follow-up as needed.

## 2023-03-07 DIAGNOSIS — I951 Orthostatic hypotension: Secondary | ICD-10-CM | POA: Diagnosis not present

## 2023-03-07 DIAGNOSIS — E860 Dehydration: Secondary | ICD-10-CM | POA: Diagnosis not present

## 2023-03-07 DIAGNOSIS — K3184 Gastroparesis: Secondary | ICD-10-CM | POA: Diagnosis not present

## 2023-03-12 ENCOUNTER — Ambulatory Visit (INDEPENDENT_AMBULATORY_CARE_PROVIDER_SITE_OTHER): Payer: BC Managed Care – PPO | Admitting: Gastroenterology

## 2023-03-12 ENCOUNTER — Encounter: Payer: Self-pay | Admitting: Gastroenterology

## 2023-03-12 VITALS — BP 136/85 | HR 86 | Temp 97.6°F | Ht 62.0 in | Wt 126.6 lb

## 2023-03-12 DIAGNOSIS — R112 Nausea with vomiting, unspecified: Secondary | ICD-10-CM

## 2023-03-12 DIAGNOSIS — R634 Abnormal weight loss: Secondary | ICD-10-CM

## 2023-03-12 NOTE — Patient Instructions (Signed)
Continue Reglan 5mg  before meals.  Continue promethazine as needed for nausea/vomiting.  Use Linzess daily as needed for constipation. Complete labs, we will be in touch with results and further recommendations. Call with any recurrent/persistent vomiting.

## 2023-03-12 NOTE — Progress Notes (Signed)
GI Office Note    Referring Provider: Roe Rutherford, NP Primary Care Physician:  Roe Rutherford, NP  Primary Gastroenterologist: Hennie Duos. Marletta Lor, DO   Chief Complaint   Chief Complaint  Patient presents with   Nausea    Issues with nausea/vomiting, having to take phenergan about every day    History of Present Illness   [name] is a 57 y.o. female presenting today for urgent follow up due to gastroparesis, at the request of Roe Rutherford, NP. She has history of chronic GERD, recurrent nausea/vomiting, intermittent abdominal pain since July 2023 associated with weight loss. Last seen 09/2022.    Has been sick since 02/2022. Notes one month before she started having symptoms, her Leafy Kindle was doubled from 1.5 to 3mg . During hospitalization earlier this year there was question that her symptoms may be due to this change. When I saw back in 09/2022, her her PCP has started tapering off medication. Numerous allergies to PPI, was on famotidine 40 mg twice daily for gastritis.  Since her last ov, she came off Vraylar. Continued to have daily nausea although vomiting comes in waves. She has had a lot of issues with daily syncope, but none in 2 months per patient. Has been extensively evaluated by cardiology. She re-established care with her old PCP, Roe Rutherford, NP. She went to Dr. Fransico Him, but had no work up and was told she needed to make lifestyle changes. She did not go back for follow up.   Since her last visit here, she lost down to 114 pounds. She went three weeks without vomiting. Gained up to 128 pounds. Her hypotension resolved, syncope seems to have resolved. She still has daily nausea. Worse in the mornings upon waking up. Typically able to eat supper but takes in very little food throughout the day. Has no appetite. No heartburn. No longer on pepcid. No abdominal pain. BMs every 3 days, before she became ill, she was going daily. Likely a reflection of poor oral  intake. Went a week without BM last week when she had intractable vomiting. Had few small stools since then, initially hard and associated with straining. Little brbpr on tissue. No melena.   Notes pork makes her sick. Seems to tolerate beef if limits to twice per week. No other obvious food intolerances. Previously equivocal alpha gal IgE.   For past two weeks she started having intractable vomiting. Could not keep anything down. She would vomit water even if only taking sips or ice chips. Received fluids with PCP. Went to urgent care as well. Finally starting to feel better but having to take promethazine 3-4 times daily on top of reglan. Last episode of vomiting was Saturday.    PCP stopped Florinef about one month ago, was on it for orthostatic hypotension, has been maintaining blood pressures. PCP did extensive labs last month with catecholamines, aldosterone, metanephrines, cortisol, PTH intact.  Vitamin D was slightly low at 29.9.  Plasma renin activity less than 0.167.   Previous workup:  GES: 04/2022 normal   Last EGD:8/23  - 3 cm hiatal hernia. - Esophageal mucosal changes consistent with short-segment Barrett's esophagus, confirmed with biopsy. - Mild Schatzki ring. Dilated. - Gastritis. Biopsied. No h.pylori - Normal duodenal bulb, first portion of the duodenum and second portion of the duodenum.  Last Colonoscopy: 10/23   - Non-bleeding internal hemorrhoids. - Diverticulosis in the sigmoid colon. - One 5 mm polyp in the descending colon, removed with a cold snare. Resected  and retrieved. - The examination was otherwise normal. -tubular adenomas -next colonoscopy five years  CT A/P with contrast 09/2022: IMPRESSION: 1. Acute uncomplicated sigmoid colonic diverticulitis/colitis. 2. Wedge-shaped hypoenhancing areas in the left lower pole kidney with overlying cortical scarring, suggest correlation with laboratory values for pyelonephritis. 3.  Aortic Atherosclerosis  (ICD10-I70.0).  CT chest with contrast 11/2022: IMPRESSION: -Few tiny bilateral lung nodules identified measuring under 5 mm. No follow-up needed if patient is low-risk (and has no known or suspected primary neoplasm). Non-contrast chest CT can be considered in 12 months if patient is high-risk. This recommendation follows the consensus statement: Guidelines for Management of Incidental Pulmonary Nodules Detected on CT Images: From the Fleischner Society 2017; Radiology 2017; 284:228-243.   -No developing other mass lesion, fluid collection or lymph node enlargement in the thorax.   -Fatty liver infiltration.   -Coronary artery calcifications. Please correlate for other coronary risk factors.  Medications   Current Outpatient Medications  Medication Sig Dispense Refill   albuterol (PROVENTIL HFA;VENTOLIN HFA) 108 (90 BASE) MCG/ACT inhaler Inhale 2 puffs into the lungs every 6 (six) hours as needed for shortness of breath.      ALPRAZolam (XANAX) 0.5 MG tablet Take 0.5 mg by mouth 2 (two) times daily.     metoCLOPramide (REGLAN) 10 MG tablet Take 1 tablet (10 mg total) by mouth every 6 (six) hours. (Patient taking differently: Take 5 mg by mouth 3 (three) times daily before meals.) 30 tablet 0   OLANZapine (ZYPREXA) 5 MG tablet Take 1/2 tablet for one week, then increase to one tablet at bedtime     promethazine (PHENERGAN) 12.5 MG tablet Take 12.5 mg by mouth every 6 (six) hours as needed for nausea or vomiting. Takes 1-2 every six hours as needed.     sertraline (ZOLOFT) 50 MG tablet Take by mouth.     No current facility-administered medications for this visit.    Allergies   Allergies as of 03/12/2023 - Review Complete 03/12/2023  Allergen Reaction Noted   Bee venom Anaphylaxis 03/07/2021   Motrin [ibuprofen] Other (See Comments) 07/18/2022   Codeine Nausea And Vomiting and Rash 05/21/2011   Penicillins Nausea And Vomiting and Rash 05/21/2011   Prilosec [omeprazole]  Swelling and Rash 03/15/2013   Zantac [ranitidine] Rash 03/07/2021        Review of Systems   General: Negative for weight loss, fever, chills, fatigue, +weakness. See hpi ENT: Negative for hoarseness, difficulty swallowing , nasal congestion. CV: Negative for chest pain, angina, palpitations, dyspnea on exertion, peripheral edema.  Respiratory: Negative for dyspnea at rest, dyspnea on exertion, cough, sputum, wheezing.  GI: See history of present illness. GU:  Negative for dysuria, hematuria, urinary incontinence, urinary frequency, nocturnal urination.  Endo: Negative for unusual weight change.     Physical Exam   BP 136/85 (BP Location: Right Arm, Patient Position: Sitting, Cuff Size: Normal)   Pulse 86   Temp 97.6 F (36.4 C) (Oral)   Ht 5\' 2"  (1.575 m)   Wt 126 lb 9.6 oz (57.4 kg)   SpO2 96%   BMI 23.16 kg/m    General: in no acute distress.  Eyes: No icterus. Mouth: Oropharyngeal mucosa moist and pink   Abdomen: Bowel sounds are normal, nontender, nondistended, no hepatosplenomegaly or masses,  no abdominal bruits or hernia , no rebound or guarding.  Rectal: not performed Extremities: No lower extremity edema. No clubbing or deformities. Neuro: Alert and oriented x 4   Skin: Warm and dry, no  jaundice.   Psych: Alert and cooperative, normal mood and affect.  Labs   Lab Results  Component Value Date   CREATININE 0.66 01/16/2023   BUN 10 01/16/2023   NA 139 01/16/2023   K 3.7 01/16/2023   CL 103 01/16/2023   CO2 27 01/16/2023   Lab Results  Component Value Date   WBC 10.0 01/16/2023   HGB 15.0 01/16/2023   HCT 43.2 01/16/2023   MCV 95.4 01/16/2023   PLT 214 01/16/2023   Lab Results  Component Value Date   ALT 24 12/26/2022   AST 24 12/26/2022   ALKPHOS 55 12/26/2022   BILITOT 1.4 (H) 12/26/2022   Lab Results  Component Value Date   LIPASE 31 12/26/2022   February 2024: Iron 75, iron saturation 25%, TIBC 304.  B12 458.  Folate 10.2.  Imaging  Studies   No results found.  Assessment   GERD/gastritis: denies typical symptoms. No further abdominal pain. No longer on pepcid. Multiple PPI allergies.   Constipation: uses Linzess prn.  Intractable N/V: etiology unclear. Her GES was normal. Did not improve with weaning off Vraylar. Significant anxiety could be playing a role. Currently on Xanax, Zoloft, and recently added Zyprexa. She has not associated abdominal pain. Wakes up nauseated every day. Symptoms less in the afternoons, able to eat at that time. Extensive evaluation as outlined above. We need to complete work up for Alpha Gal, previously equivocal Alpha Gal IgE. I doubt her hypotension/syncope is due to anaphylaxis but need to rule this out. Fortunately she has been able to gain back some weight and her blood pressures are better.    PLAN   Continue Reglan 5mg  QAC. Continue promethazine 12.5-25mg  every 6 hours as needed. Continue Linzess daily prn. Complete labs.  If symptoms decline again, with more frequent episodes of vomiting, hypotension, would consider repeat AM cortisol level as fasting between 7-9am and consider referral to endocrinology again, patient declined follow up in [place].   Leanna Battles. Melvyn Neth, MHS, PA-C [facility]

## 2023-03-13 ENCOUNTER — Encounter (HOSPITAL_BASED_OUTPATIENT_CLINIC_OR_DEPARTMENT_OTHER): Payer: Self-pay | Admitting: Cardiology

## 2023-03-13 DIAGNOSIS — R112 Nausea with vomiting, unspecified: Secondary | ICD-10-CM | POA: Diagnosis not present

## 2023-03-13 DIAGNOSIS — R634 Abnormal weight loss: Secondary | ICD-10-CM | POA: Diagnosis not present

## 2023-03-14 LAB — ALPHA-GAL PANEL

## 2023-03-14 NOTE — Addendum Note (Signed)
Addended by: Burnell Blanks on: 03/14/2023 08:37 AM   Modules accepted: Orders

## 2023-03-16 LAB — HEPATIC FUNCTION PANEL
ALT: 11 IU/L (ref 0–32)
AST: 15 IU/L (ref 0–40)
Albumin: 4.4 g/dL (ref 3.8–4.9)
Alkaline Phosphatase: 59 IU/L (ref 44–121)
Bilirubin Total: 0.9 mg/dL (ref 0.0–1.2)
Bilirubin, Direct: 0.21 mg/dL (ref 0.00–0.40)
Total Protein: 6.5 g/dL (ref 6.0–8.5)

## 2023-03-16 LAB — IGA: IgA/Immunoglobulin A, Serum: 121 mg/dL (ref 87–352)

## 2023-03-16 LAB — TISSUE TRANSGLUTAMINASE, IGA: Transglutaminase IgA: <2 U/mL (ref 0–3)

## 2023-03-16 LAB — ALPHA-GAL PANEL
Allergen Lamb IgE: <0.1 kU/L
Beef IgE: 0.11 kU/L — AB
IgE (Immunoglobulin E), Serum: 219 IU/mL (ref 6–495)
O215-IgE Alpha-Gal: 0.43 kU/L — AB
Pork IgE: <0.1 kU/L

## 2023-03-17 ENCOUNTER — Other Ambulatory Visit: Payer: Self-pay | Admitting: *Deleted

## 2023-03-17 DIAGNOSIS — F411 Generalized anxiety disorder: Secondary | ICD-10-CM | POA: Diagnosis not present

## 2023-03-17 DIAGNOSIS — K3184 Gastroparesis: Secondary | ICD-10-CM | POA: Diagnosis not present

## 2023-03-17 DIAGNOSIS — F3132 Bipolar disorder, current episode depressed, moderate: Secondary | ICD-10-CM | POA: Diagnosis not present

## 2023-03-17 DIAGNOSIS — Z91018 Allergy to other foods: Secondary | ICD-10-CM

## 2023-03-25 ENCOUNTER — Telehealth: Payer: BC Managed Care – PPO | Admitting: Physician Assistant

## 2023-03-25 DIAGNOSIS — J4489 Other specified chronic obstructive pulmonary disease: Secondary | ICD-10-CM

## 2023-03-25 DIAGNOSIS — B9689 Other specified bacterial agents as the cause of diseases classified elsewhere: Secondary | ICD-10-CM

## 2023-03-25 MED ORDER — ALBUTEROL SULFATE HFA 108 (90 BASE) MCG/ACT IN AERS
2.0000 | INHALATION_SPRAY | Freq: Four times a day (QID) | RESPIRATORY_TRACT | 0 refills | Status: AC | PRN
Start: 2023-03-25 — End: ?

## 2023-03-25 MED ORDER — DOXYCYCLINE HYCLATE 100 MG PO TABS
100.0000 mg | ORAL_TABLET | Freq: Two times a day (BID) | ORAL | 0 refills | Status: AC
Start: 2023-03-25 — End: ?

## 2023-03-25 NOTE — Progress Notes (Signed)
E-Visit for Sinus Problems  We are sorry that you are not feeling well.  Here is how we plan to help!  Based on what you have shared with me it looks like you have sinusitis.  Sinusitis is inflammation and infection in the sinus cavities of the head.  Based on your presentation I believe you most likely have Acute Bacterial Sinusitis.  This is an infection caused by bacteria and is treated with antibiotics. I have prescribed Doxycycline 100mg by mouth twice a day for 10 days. You may use an oral decongestant such as Mucinex D or if you have glaucoma or high blood pressure use plain Mucinex. Saline nasal spray help and can safely be used as often as needed for congestion.  If you develop worsening sinus pain, fever or notice severe headache and vision changes, or if symptoms are not better after completion of antibiotic, please schedule an appointment with a health care provider.    Sinus infections are not as easily transmitted as other respiratory infection, however we still recommend that you avoid close contact with loved ones, especially the very young and elderly.  Remember to wash your hands thoroughly throughout the day as this is the number one way to prevent the spread of infection!  Home Care: Only take medications as instructed by your medical team. Complete the entire course of an antibiotic. Do not take these medications with alcohol. A steam or ultrasonic humidifier can help congestion.  You can place a towel over your head and breathe in the steam from hot water coming from a faucet. Avoid close contacts especially the very young and the elderly. Cover your mouth when you cough or sneeze. Always remember to wash your hands.  Get Help Right Away If: You develop worsening fever or sinus pain. You develop a severe head ache or visual changes. Your symptoms persist after you have completed your treatment plan.  Make sure you Understand these instructions. Will watch your  condition. Will get help right away if you are not doing well or get worse.  Thank you for choosing an e-visit.  Your e-visit answers were reviewed by a board certified advanced clinical practitioner to complete your personal care plan. Depending upon the condition, your plan could have included both over the counter or prescription medications.  Please review your pharmacy choice. Make sure the pharmacy is open so you can pick up prescription now. If there is a problem, you may contact your provider through MyChart messaging and have the prescription routed to another pharmacy.  Your safety is important to us. If you have drug allergies check your prescription carefully.   For the next 24 hours you can use MyChart to ask questions about today's visit, request a non-urgent call back, or ask for a work or school excuse. You will get an email in the next two days asking about your experience. I hope that your e-visit has been valuable and will speed your recovery.  I have spent 5 minutes in review of e-visit questionnaire, review and updating patient chart, medical decision making and response to patient.   Adna Nofziger M Lyanne Kates, PA-C  

## 2023-06-19 ENCOUNTER — Telehealth (HOSPITAL_COMMUNITY): Payer: Self-pay

## 2023-06-19 NOTE — Telephone Encounter (Signed)
Lvm to confirm 06/19/23 appt with Dr Adrian Blackwater

## 2023-06-20 NOTE — Telephone Encounter (Signed)
Called no answer text message confirmed

## 2023-06-23 ENCOUNTER — Encounter (HOSPITAL_COMMUNITY): Payer: Self-pay | Admitting: Psychiatry

## 2023-06-23 ENCOUNTER — Ambulatory Visit (INDEPENDENT_AMBULATORY_CARE_PROVIDER_SITE_OTHER): Payer: Self-pay | Admitting: Psychiatry

## 2023-06-23 DIAGNOSIS — F411 Generalized anxiety disorder: Secondary | ICD-10-CM | POA: Insufficient documentation

## 2023-06-23 DIAGNOSIS — R4589 Other symptoms and signs involving emotional state: Secondary | ICD-10-CM

## 2023-06-23 DIAGNOSIS — F5104 Psychophysiologic insomnia: Secondary | ICD-10-CM

## 2023-06-23 DIAGNOSIS — F4310 Post-traumatic stress disorder, unspecified: Secondary | ICD-10-CM | POA: Insufficient documentation

## 2023-06-23 DIAGNOSIS — F4001 Agoraphobia with panic disorder: Secondary | ICD-10-CM

## 2023-06-23 DIAGNOSIS — F319 Bipolar disorder, unspecified: Secondary | ICD-10-CM

## 2023-06-23 DIAGNOSIS — Z79899 Other long term (current) drug therapy: Secondary | ICD-10-CM

## 2023-06-23 MED ORDER — VENLAFAXINE HCL ER 75 MG PO CP24
75.0000 mg | ORAL_CAPSULE | Freq: Every day | ORAL | 2 refills | Status: DC
Start: 2023-06-23 — End: 2023-07-21

## 2023-06-23 MED ORDER — OLANZAPINE 10 MG PO TABS: 10.0000 mg | ORAL_TABLET | Freq: Every day | ORAL | 2 refills | Status: DC

## 2023-06-23 MED ORDER — DIVALPROEX SODIUM 125 MG PO CSDR: 125.0000 mg | DELAYED_RELEASE_CAPSULE | Freq: Every day | ORAL | 2 refills | Status: DC

## 2023-06-23 NOTE — Patient Instructions (Addendum)
We discontinued the Xanax (alprazolam) today as in PTSD this can keep you stuck in painful memories and overtime can actually worsen anxiety when used chronically.  Your homework assignment is to find things that you enjoy doing and start to do them again as long as they are not in a stressful environment.  Similarly if you are interested in smoking cessation free resources you can call the number on the quit Line website: http://[email or web address]/  Ultimately we will need to get an updated EKG, lipid panel, A1c but if this is because prohibitive right now we will hold off for the time being until you can get insurance again.  Also we will need to get an updated Depakote level at some point in the future.  Lab work is likely because prohibitive right now but at some point it would be good to get a vitamin D, B12, folate, iron panel at your PCP's office.  The only medicine change we made besides the Xanax as above was to increase your Zyprexa (olanzapine) back to 10 mg nightly.  We otherwise kept the venlafaxine and Depakote the same.  I want to start looking through the DBT (dialectical behavioral therapy) workbook which can be found here: [email or web address]://www.mosley.info/.pdf
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
## 2023-07-10 ENCOUNTER — Ambulatory Visit (INDEPENDENT_AMBULATORY_CARE_PROVIDER_SITE_OTHER): Payer: Self-pay

## 2023-07-21 ENCOUNTER — Encounter (HOSPITAL_COMMUNITY): Payer: Self-pay | Admitting: Psychiatry

## 2023-07-21 ENCOUNTER — Telehealth (INDEPENDENT_AMBULATORY_CARE_PROVIDER_SITE_OTHER): Payer: Self-pay | Admitting: Psychiatry

## 2023-07-21 DIAGNOSIS — R4589 Other symptoms and signs involving emotional state: Secondary | ICD-10-CM

## 2023-07-21 DIAGNOSIS — F4310 Post-traumatic stress disorder, unspecified: Secondary | ICD-10-CM

## 2023-07-21 DIAGNOSIS — F319 Bipolar disorder, unspecified: Secondary | ICD-10-CM

## 2023-07-21 DIAGNOSIS — Z5181 Encounter for therapeutic drug level monitoring: Secondary | ICD-10-CM

## 2023-07-21 DIAGNOSIS — Z79899 Other long term (current) drug therapy: Secondary | ICD-10-CM

## 2023-07-21 DIAGNOSIS — F5104 Psychophysiologic insomnia: Secondary | ICD-10-CM

## 2023-07-21 DIAGNOSIS — F4001 Agoraphobia with panic disorder: Secondary | ICD-10-CM

## 2023-07-21 DIAGNOSIS — F17290 Nicotine dependence, other tobacco product, uncomplicated: Secondary | ICD-10-CM

## 2023-07-21 DIAGNOSIS — F411 Generalized anxiety disorder: Secondary | ICD-10-CM

## 2023-07-21 MED ORDER — OLANZAPINE 10 MG PO TABS
10.0000 mg | ORAL_TABLET | Freq: Every day | ORAL | 2 refills | Status: DC
Start: 2023-07-21 — End: 2023-11-28

## 2023-07-21 MED ORDER — DIVALPROEX SODIUM 125 MG PO CSDR
125.0000 mg | DELAYED_RELEASE_CAPSULE | Freq: Every day | ORAL | 2 refills | Status: DC
Start: 2023-07-21 — End: 2024-06-11

## 2023-07-21 MED ORDER — VENLAFAXINE HCL ER 150 MG PO CP24
150.0000 mg | ORAL_CAPSULE | Freq: Every day | ORAL | 2 refills | Status: DC
Start: 2023-07-21 — End: 2023-11-28

## 2023-07-21 NOTE — Patient Instructions (Addendum)
As we discussed, when your insurance kicks in and we will likely plan on getting several necessary pieces of blood work and a nutrition referral but will hold out for now due to cost.  Please be on the lookout for signs of elevated levels of Depakote like yellowing of skin, tremors, facial hair, gum problems.  We increased the venlafaxine XR (Effexor XR) to 150 mg once daily today.  If you have any worsening of sleep with this change please let me know and we will likely increase the Zyprexa.  Do let your PCP know about the further weight loss.  In the meantime, you can go to the quit Line website and they will mail you free resources: [email or web address]-[email or web address]  Just be sure to take off the nicotine patches before bedtime.
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
## 2023-09-29 ENCOUNTER — Telehealth (HOSPITAL_COMMUNITY): Payer: Self-pay | Admitting: Psychiatry

## 2023-10-06 ENCOUNTER — Telehealth: Payer: Self-pay | Admitting: Emergency Medicine

## 2023-10-06 DIAGNOSIS — J40 Bronchitis, not specified as acute or chronic: Secondary | ICD-10-CM

## 2023-10-06 MED ORDER — DOXYCYCLINE HYCLATE 100 MG PO TABS: 100.0000 mg | ORAL_TABLET | Freq: Two times a day (BID) | ORAL | 0 refills | Status: AC

## 2023-10-06 NOTE — Progress Notes (Signed)
 E-Visit for Cough   We are sorry that you are not feeling well.  Here is how we plan to help!  Based on your presentation I believe you most likely have A cough due to bacteria.  When patients have a fever and a productive cough with a change in color or increased sputum production, we are concerned about bacterial bronchitis.  If left untreated it can progress to pneumonia.  If your symptoms do not improve with your treatment plan it is important that you contact your provider.   I have prescribed Doxycycline  100 mg twice a day for 7 days     In addition you may use A non-prescription cough medication called Mucinex DM: take 2 tablets every 12 hours.  Continue using your albuterol  inhaler.   From your responses in the eVisit questionnaire you describe inflammation in the upper respiratory tract which is causing a significant cough.  This is commonly called Bronchitis and has four common causes:   Allergies Viral Infections Acid Reflux Bacterial Infection Allergies, viruses and acid reflux are treated by controlling symptoms or eliminating the cause. An example might be a cough caused by taking certain blood pressure medications. You stop the cough by changing the medication. Another example might be a cough caused by acid reflux. Controlling the reflux helps control the cough.  USE OF BRONCHODILATOR (RESCUE) INHALERS: There is a risk from using your bronchodilator too frequently.  The risk is that over-reliance on a medication which only relaxes the muscles surrounding the breathing tubes can reduce the effectiveness of medications prescribed to reduce swelling and congestion of the tubes themselves.  Although you feel brief relief from the bronchodilator inhaler, your asthma may actually be worsening with the tubes becoming more swollen and filled with mucus.  This can delay other crucial treatments, such as oral steroid medications. If you need to use a bronchodilator inhaler daily, several  times per day, you should discuss this with your provider.  There are probably better treatments that could be used to keep your asthma under control.     HOME CARE Only take medications as instructed by your medical team. Complete the entire course of an antibiotic. Drink plenty of fluids and get plenty of rest. Avoid close contacts especially the very young and the elderly Cover your mouth if you cough or cough into your sleeve. Always remember to wash your hands A steam or ultrasonic humidifier can help congestion.   GET HELP RIGHT AWAY IF: You develop worsening fever. You become short of breath You cough up blood. Your symptoms persist after you have completed your treatment plan MAKE SURE YOU  Understand these instructions. Will watch your condition. Will get help right away if you are not doing well or get worse.    Thank you for choosing an e-visit.  Your e-visit answers were reviewed by a board certified advanced clinical practitioner to complete your personal care plan. Depending upon the condition, your plan could have included both over the counter or prescription medications.  Please review your pharmacy choice. Make sure the pharmacy is open so you can pick up prescription now. If there is a problem, you may contact your provider through [employer] and have the prescription routed to another pharmacy.  Your safety is important to us . If you have drug allergies check your prescription carefully.   For the next 24 hours you can use MyChart to ask questions about today's visit, request a non-urgent call back, or ask for a  work or school excuse. You will get an email in the next two days asking about your experience. I hope that your e-visit has been valuable and will speed your recovery.  I have spent 5 minutes in review of e-visit questionnaire, review and updating patient chart, medical decision making and response to patient.   Jon Belt, PhD, FNP-BC

## 2023-10-12 ENCOUNTER — Other Ambulatory Visit (HOSPITAL_COMMUNITY): Payer: Self-pay | Admitting: Psychiatry

## 2023-10-12 DIAGNOSIS — F411 Generalized anxiety disorder: Secondary | ICD-10-CM

## 2023-10-12 DIAGNOSIS — F4310 Post-traumatic stress disorder, unspecified: Secondary | ICD-10-CM

## 2023-10-12 DIAGNOSIS — R4589 Other symptoms and signs involving emotional state: Secondary | ICD-10-CM

## 2023-10-12 DIAGNOSIS — F4001 Agoraphobia with panic disorder: Secondary | ICD-10-CM

## 2023-10-22 ENCOUNTER — Other Ambulatory Visit (HOSPITAL_COMMUNITY): Payer: Self-pay | Admitting: Psychiatry

## 2023-10-22 DIAGNOSIS — F411 Generalized anxiety disorder: Secondary | ICD-10-CM

## 2023-10-22 DIAGNOSIS — F4310 Post-traumatic stress disorder, unspecified: Secondary | ICD-10-CM

## 2023-10-22 DIAGNOSIS — R4589 Other symptoms and signs involving emotional state: Secondary | ICD-10-CM

## 2023-10-22 DIAGNOSIS — F4001 Agoraphobia with panic disorder: Secondary | ICD-10-CM

## 2023-10-29 ENCOUNTER — Other Ambulatory Visit (HOSPITAL_COMMUNITY): Payer: Self-pay | Admitting: Psychiatry

## 2023-10-29 DIAGNOSIS — F411 Generalized anxiety disorder: Secondary | ICD-10-CM

## 2023-10-29 DIAGNOSIS — F4001 Agoraphobia with panic disorder: Secondary | ICD-10-CM

## 2023-10-29 DIAGNOSIS — F319 Bipolar disorder, unspecified: Secondary | ICD-10-CM

## 2023-10-29 DIAGNOSIS — F5104 Psychophysiologic insomnia: Secondary | ICD-10-CM

## 2023-10-29 DIAGNOSIS — R4589 Other symptoms and signs involving emotional state: Secondary | ICD-10-CM

## 2023-11-28 ENCOUNTER — Telehealth (HOSPITAL_COMMUNITY): Payer: Self-pay | Admitting: Psychiatry

## 2023-11-28 ENCOUNTER — Telehealth (HOSPITAL_COMMUNITY): Payer: Self-pay

## 2023-11-28 ENCOUNTER — Encounter (HOSPITAL_COMMUNITY): Payer: Self-pay | Admitting: Psychiatry

## 2023-11-28 DIAGNOSIS — Z79899 Other long term (current) drug therapy: Secondary | ICD-10-CM

## 2023-11-28 DIAGNOSIS — F4001 Agoraphobia with panic disorder: Secondary | ICD-10-CM

## 2023-11-28 DIAGNOSIS — F17290 Nicotine dependence, other tobacco product, uncomplicated: Secondary | ICD-10-CM

## 2023-11-28 DIAGNOSIS — F4310 Post-traumatic stress disorder, unspecified: Secondary | ICD-10-CM

## 2023-11-28 DIAGNOSIS — R4589 Other symptoms and signs involving emotional state: Secondary | ICD-10-CM

## 2023-11-28 DIAGNOSIS — F319 Bipolar disorder, unspecified: Secondary | ICD-10-CM

## 2023-11-28 DIAGNOSIS — F411 Generalized anxiety disorder: Secondary | ICD-10-CM

## 2023-11-28 DIAGNOSIS — F5104 Psychophysiologic insomnia: Secondary | ICD-10-CM

## 2023-11-28 MED ORDER — VENLAFAXINE HCL ER 150 MG PO CP24
150.0000 mg | ORAL_CAPSULE | Freq: Every day | ORAL | 2 refills | Status: DC
Start: 2023-12-11 — End: 2024-06-11

## 2023-11-28 MED ORDER — VENLAFAXINE HCL ER 75 MG PO CP24
75.0000 mg | ORAL_CAPSULE | Freq: Every day | ORAL | 0 refills | Status: DC
Start: 2023-11-28 — End: 2024-08-02

## 2023-11-28 MED ORDER — OLANZAPINE 10 MG PO TABS: ORAL_TABLET | ORAL | 2 refills | Status: AC
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
## 2023-11-28 NOTE — Patient Instructions (Signed)
 We restarted the venlafaxine XR (Effexor XR) at 75 mg daily with breakfast for 2 weeks.  Then you will increase to the 150 mg daily dose to be available department around that time.  We also restarted Zyprexa (olanzapine) 10 mg tablets.  Take half a tablet nightly for 2 weeks then increase to a full tablet nightly thereafter.  Keep calling the Department of Social Services to try and get on Medicaid while you are out of work.  Once you have insurance it would be a good idea to get a nutrition referral and get several pieces of necessary blood work including an updated valproic acid level, A1c, lipid panel, EKG, CBC, vitamin D, B12, folate, iron panel.

## 2023-11-28 NOTE — Telephone Encounter (Signed)
 Called pt to schedule 3 month f/u with [facility] per Dr Adrian Blackwater, no answer left vm to call us back

## 2023-12-15 ENCOUNTER — Encounter: Payer: Self-pay | Admitting: Psychiatry

## 2024-03-21 NOTE — Telephone Encounter (Signed)
 Erroneous encounter-disregard

## 2024-06-06 NOTE — Progress Notes (Signed)
 [name] Sink, female    DOB: May 03, 1966    MRN: [id number]   Brief patient profile:  38  yowf active vaper  last smoked fall 205  referred to pulmonary clinic in [place]  06/08/2024 by husband  for recurrent pna/ bronchitis   Pt not previously seen by PCCM service.     History of Present Illness  06/08/2024  Pulmonary/ 1st office eval/ [name] / [place] [facility]  Chief Complaint  Patient presents with   Establish Care    Abn ct - shob - coughing - pna  Dyspnea:  improved on levaquin 750 x 6/7  Cough: immediately when head hits pillow > no purulent sputum Sleep: bed is flat and lots of pillows  SABA use: albuterol  seeps to help  02: none     No obvious day to day or daytime pattern/variability or assoc excess/ purulent sputum or mucus plugs or hemoptysis or cp or chest tightness, subjective wheeze or overt sinus or hb symptoms.    Also denies any obvious fluctuation of symptoms with weather or environmental changes or other aggravating or alleviating factors except as outlined above   No unusual exposure hx or h/o childhood pna/ asthma or knowledge of premature birth.  Current Allergies, Complete Past Medical History, Past Surgical History, Family History, and Social History were reviewed in [employer] record.  ROS  The following are not active complaints unless bolded Hoarseness, sore throat, dysphagia, dental problems, itching, sneezing,  nasal congestion or discharge of excess mucus or purulent secretions, ear ache,   fever, chills, sweats, unintended wt loss or wt gain, classically pleuritic or exertional cp,  orthopnea pnd or arm/hand swelling  or leg swelling, presyncope, palpitations, abdominal pain, anorexia, nausea, vomiting, diarrhea  or change in bowel habits or change in bladder habits, change in stools or change in urine, dysuria, hematuria,  rash, arthralgias, visual complaints, headache, numbness, weakness or ataxia or problems with walking or  coordination,  change in mood or  memory.            Outpatient Medications Prior to Visit  Medication Sig Dispense Refill   albuterol  (VENTOLIN  HFA) 108 (90 Base) MCG/ACT inhaler Inhale 2 puffs into the lungs every 6 (six) hours as needed for shortness of breath. 18 g 0   divalproex  (DEPAKOTE  SPRINKLE) 125 MG capsule Take 1 capsule (125 mg total) by mouth daily. 30 capsule 2   OLANZapine  (ZYPREXA ) 10 MG tablet Take half a tablet nightly for 2 weeks. Then increase to full tablet nightly thereafter. 30 tablet 2   venlafaxine  XR (EFFEXOR  XR) 75 MG 24 hr capsule Take 1 capsule (75 mg total) by mouth daily with breakfast. 14 capsule 0   famotidine  (PEPCID ) 40 MG tablet Take 1 tablet by mouth at bedtime. (Patient not taking: Reported on 06/08/2024)     venlafaxine  XR (EFFEXOR -XR) 150 MG 24 hr capsule Take 1 capsule (150 mg total) by mouth daily with breakfast. (Patient not taking: Reported on 06/08/2024) 30 capsule 2   No facility-administered medications prior to visit.    Past Medical History:  Diagnosis Date   Asthma    COPD (chronic obstructive pulmonary disease) (HCC)    Depression    Hypertension    PONV (postoperative nausea and vomiting)    Seasonal allergies    TIA (transient ischemic attack) 01/2022      Objective:     BP (!) 150/98   Pulse 91   Ht 5' 2 (1.575 m)  Wt 179 lb 6.4 oz (81.4 kg)   SpO2 96% Comment: ra  BMI 32.81 kg/m   SpO2: 96 % (ra)  amb  wf nad   Wt Readings from Last 3 Encounters:  06/08/24 179 lb 6.4 oz (81.4 kg)  03/12/23 126 lb 9.6 oz (57.4 kg)  01/22/23 128 lb (58.1 kg)    HEENT : Oropharynx  clear      Nasal turbinates nl    NECK :  without  apparent JVD/ palpable Nodes/TM    LUNGS: no acc muscle use,  Nl contour chest which is clear to A and P bilaterally without cough on insp or exp maneuvers   CV:  RRR  no s3 or murmur or increase in P2, and trace bilateral LE edema   ABD:  soft and nontender   MS:  Gait nl   ext warm without  deformities Or obvious joint restrictions  calf tenderness, cyanosis -  moderate clubbing    SKIN: warm and dry without lesions    NEURO:  alert, approp, nl sensorium with  no motor or cerebellar deficits apparent.     I personally reviewed images and agree with radiology impression as follows:   Chest LDSCT     05/19/24     -Small 2-3 mm left lower upper lobe (series 6, image 76) and right upper lobe nodules (image 92).  -Multiple areas of slight nonsolid nodularity throughout the upper lungs likely reflecting sequela of smoking related respiratory bronchiolitis.  -No suspicious pulmonary nodules.      I personally reviewed images and agree with radiology impression as follows:   Chest CT w/o contrast    06/01/24  Patchy nodular infiltrate within the medial aspect of the right middle lobe, appearing new since prior chest CT 05/19/2024, probably inflammatory/infectious infiltrate. Correlate clinically for pneumonia.       Assessment      Assessment & Plan DOE (dyspnea on exertion) Smoker > vaper x 2024 and breathing/ cough worse since with non-specific changes on CT c/w inflammation or infection 06/01/24  - rx 06/01/24  levaquin 500 mg x 7 d - 06/08/2024   Walked on RA  x  3  lap(s) =  approx 450  ft  @ mod pace, stopped due to end of study  with lowest 02 sats 97% but back to 98% before finishing with only mild sob    The fact she worse off cigs is likely due to contaminant in her VAPES and needs to stop now rather than incur additional w/u or empirical tries of further abx or anti-inflammatories (eg OCS)   F/u in pulmonary clinic can be prn with pfts on return after completes her GI w/u for uneplained nausea and vomiting.  Each maintenance medication was reviewed in detail including emphasizing most importantly the difference between maintenance and prns and under what circumstances the prns are to be triggered using an action plan format where appropriate.  Total time for H and P, chart  review, counseling,  directly observing portions of ambulatory 02 saturation study/ and generating customized AVS unique to this office visit / same day charting = 30 min new pt eval                   AVS  Patient Instructions  Please breathe cleane air, it's the most important aspect of your care   Finish your antibiotics as planned and keep up follow up with GI and I will see you back here as needed  for worse cough or shortness of breath   Ozell America, MD 06/08/2024

## 2024-06-08 ENCOUNTER — Encounter: Payer: Self-pay | Admitting: Internal Medicine

## 2024-06-08 ENCOUNTER — Ambulatory Visit (INDEPENDENT_AMBULATORY_CARE_PROVIDER_SITE_OTHER): Payer: Self-pay | Admitting: Internal Medicine

## 2024-06-08 VITALS — BP 150/98 | HR 91 | Ht 62.0 in | Wt 179.4 lb

## 2024-06-08 DIAGNOSIS — R0609 Other forms of dyspnea: Secondary | ICD-10-CM

## 2024-06-08 DIAGNOSIS — F17200 Nicotine dependence, unspecified, uncomplicated: Secondary | ICD-10-CM

## 2024-06-08 NOTE — Assessment & Plan Note (Addendum)
 Smoker > vaper x 2024 and breathing/ cough worse since with non-specific changes on CT c/w inflammation or infection 06/01/24  - rx 06/01/24  levaquin 500 mg x 7 d - 06/08/2024   Walked on RA  x  3  lap(s) =  approx 450  ft  @ mod pace, stopped due to end of study  with lowest 02 sats 97% but back to 98% before finishing with only mild sob    The fact she worse off cigs is likely due to contaminant in her VAPES and needs to stop now rather than incur additional w/u or empirical tries of further abx or anti-inflammatories (eg OCS)   F/u in pulmonary clinic can be prn with pfts on return after completes her GI w/u for uneplained nausea and vomiting.  Each maintenance medication was reviewed in detail including emphasizing most importantly the difference between maintenance and prns and under what circumstances the prns are to be triggered using an action plan format where appropriate.  Total time for H and P, chart review, counseling,  directly observing portions of ambulatory 02 saturation study/ and generating customized AVS unique to this office visit / same day charting = 30 min new pt eval

## 2024-06-08 NOTE — Patient Instructions (Addendum)
 Please breathe clean air, it's the most important aspect of your care   Finish your antibiotics as planned and keep up follow up with GI and I will see you back here as needed for worse cough or shortness of breath

## 2024-06-11 ENCOUNTER — Encounter: Payer: Self-pay | Admitting: Gastroenterology

## 2024-06-11 ENCOUNTER — Ambulatory Visit (INDEPENDENT_AMBULATORY_CARE_PROVIDER_SITE_OTHER): Payer: Self-pay | Admitting: Gastroenterology

## 2024-06-11 ENCOUNTER — Telehealth: Payer: Self-pay | Admitting: Gastroenterology

## 2024-06-11 VITALS — BP 115/76 | HR 109 | Temp 99.0°F | Ht 64.0 in | Wt 179.2 lb

## 2024-06-11 DIAGNOSIS — R112 Nausea with vomiting, unspecified: Secondary | ICD-10-CM

## 2024-06-11 DIAGNOSIS — R635 Abnormal weight gain: Secondary | ICD-10-CM

## 2024-06-11 DIAGNOSIS — R933 Abnormal findings on diagnostic imaging of other parts of digestive tract: Secondary | ICD-10-CM

## 2024-06-11 DIAGNOSIS — K2270 Barrett's esophagus without dysplasia: Secondary | ICD-10-CM

## 2024-06-11 DIAGNOSIS — K862 Cyst of pancreas: Secondary | ICD-10-CM

## 2024-06-11 DIAGNOSIS — Z860101 Personal history of adenomatous and serrated colon polyps: Secondary | ICD-10-CM

## 2024-06-11 DIAGNOSIS — K7689 Other specified diseases of liver: Secondary | ICD-10-CM

## 2024-06-11 DIAGNOSIS — K219 Gastro-esophageal reflux disease without esophagitis: Secondary | ICD-10-CM

## 2024-06-11 DIAGNOSIS — K769 Liver disease, unspecified: Secondary | ICD-10-CM

## 2024-06-11 MED ORDER — FAMOTIDINE 20 MG PO TABS
20.0000 mg | ORAL_TABLET | Freq: Two times a day (BID) | ORAL | 5 refills | Status: AC
Start: 2024-06-11 — End: ?

## 2024-06-11 NOTE — Telephone Encounter (Signed)
 Please let patient know that we should start with the following to evaluate for adrenal insufficiency.  FASTING AM cortisol level  Fast after midnight Lab at 8am Cannot be done while on steroids.

## 2024-06-11 NOTE — H&P (View-Only) (Signed)
 GI Office Note    Referring Provider: Suanne Pfeiffer, NP Primary Care Physician:  Suanne Pfeiffer, NP  Primary Gastroenterologist: Carlin POUR. Cindie, DO   Chief Complaint   Chief Complaint  Patient presents with   Nausea    States that she has still been throwing up almost every time she eats and is also here for abnormal imaging.    History of Present Illness   KANA REIMANN is a 58 y.o. female presenting today for follow up of intractable N/V and to address abnormal imaging. Patient was last seen June 2024.  She has a history of chronic intermittent N/V dating back to 2023, chronic GERD/Barrett's, intermittent abdominal pain, weight loss. She was lost to follow up last year.    Discussed the use of AI scribe software for clinical note transcription with the patient, who gave verbal consent to proceed.   She continues to have intractable N/V. Symptoms initially started in 02/2022. She reported at that time symptoms started after doubling her dose of Vraylar . However with eventually stopping medication symptoms did not resolved.  States she has vomiting mostly every day. According to PCP note, her symptoms had settled down a bit until about 7 months ago. She estimates that she is retaining about 15% of her food intake. Vomiting occurs 'every day, all day' with occasional breaks of one to two days. Nausea and vomiting are present regardless of food type. Typically tolerates PopTarts but sometimes will vomit crackers, chicken, fish. Sometimes she tolerates fast food hamburger but sometimes she doesn't. No rhyme or reason. She feels nauseated before vomiting. She reports coming off Reglan  previously but PCP restarted in the recent months, and sh did not find it effective after taking it for two months, three times a day before meals, was ineffective. She uses two phenergan  with severe symptoms and sometimes this does not work. She notes that her PCP has worked hard switching out all of  her medications to see if they were associated with her N/V and nothing has helped.  She wants us  to follow up on her abnormal imaging. She had abnormal esophagus, liver, pancreas as outlined below.   She has a history of Barrett's esophagus and is not currently on any medication for reflux. She previously took Pepcid  but is not on it currently. No heartburn or indigestion, but she reports soreness in her abdomen, which she attributes to vomiting and coughing. She had swelling and rash to omeprazole in the past and has avoided the PPI class. She had rash to ranitidine in the past but tolerates famotidine . She denies melena, brbpr, constipation, diarrhea, dysphagia. No hematemesis. Abdominal pain in upper abdomen from vomiting/coughing.  She is concerned about significant weight gain, increasing from 114 pounds to 179 pounds since she was hospitalized in 2024 due to her vomiting. At her last ov here, she weighed 126 pounds. She reports weighing in the 140 range a few months ago. She attributes her weight gain to fluid retention. She takes a diuretic every other day or daily as needed. She does not understand how she could gain weight with daily vomiting. She drinks water, liquid IV, diet Pepsi (caffeine free).  Looking back at her PCP notes, weight trends: 127 lb (01/2023), 157 lb (05/2023), 157 lb (08/2023), 174 lb (01/2024), 170lb (03/2024), 177 lb (05/04/24).   She complains of fatigue. She can walk short distances without significant SOB. She sleeps in a recliner due to breathing issues and coughing. She states she does  not heal well. She has sores on her arms from her dogs nails that are not healing well. She easily bruises.    Her blood pressure fluctuates significantly, with episodes of hypotension lasting a couple of days, during which she has passed out multiple times. Blood pressure readings have been as low as 78/40 mmHg, with associated high heart rates of 129-130 bpm. She has a history of high blood  pressure requiring medication in the past but ultimately was taken off medications. She was extensively evaluation by cardiology in 2023, worked up for possible pheochromocytoma and hyperaldosteronism, lab testing reassuring but was placed on aldactone  at that time for presumed hyperaldosteronism. Last seen by cardiology in 01/2023.  She has been evaluated by multiple specialists, including a pulmonologist, neurologist, cardiologist, endocrinologist and GI for her symptoms. Most recently seeing the pulmonologist and neurologist.  She states the lung specialist is more concerned about having her N/V addressed. She does have COPD.     Wt Readings from Last 25 Encounters:  06/11/24 179 lb 3.2 oz (81.3 kg)  06/08/24 179 lb 6.4 oz (81.4 kg)  03/12/23 126 lb 9.6 oz (57.4 kg)  01/22/23 128 lb (58.1 kg)  01/16/23 127 lb (57.6 kg)  12/26/22 120 lb (54.4 kg)  11/13/22 118 lb (53.5 kg)  10/31/22 117 lb (53.1 kg)  10/22/22 122 lb (55.3 kg)  10/20/22 117 lb (53.1 kg)  10/13/22 125 lb 10.6 oz (57 kg)  10/11/22 121 lb 9.6 oz (55.2 kg)  10/10/22 120 lb 6.4 oz (54.6 kg)  09/06/22 124 lb 4.8 oz (56.4 kg)  07/23/22 129 lb (58.5 kg)  07/17/22 129 lb (58.5 kg)  05/03/22 137 lb 6.4 oz (62.3 kg)  05/02/22 137 lb 6.4 oz (62.3 kg)  04/15/22 136 lb 8 oz (61.9 kg)  04/11/22 137 lb (62.1 kg)  02/19/22 150 lb 6.4 oz (68.2 kg)  02/05/22 153 lb 9.6 oz (69.7 kg)  01/29/22 158 lb 12.8 oz (72 kg)  01/10/22 156 lb 8 oz (71 kg)  01/08/22 156 lb 6.4 oz (70.9 kg)    Prior Data   Labs from August 2025: TIBC 349, iron 53, iron sats 15, B12 847, glucose 88, creatinine 0.71, sodium 141, potassium 3.8, albumin 4, total bilirubin 0.3, alk phos 81, AST 18, ALT 14, CRP 14 H.  Labs from July 2025: White blood cell count 8.7, hemoglobin 12.8, MCV 94, platelets 282. TSH 1.090.  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   CT abdomen with  and without IV contrast pelvis with IV contrast 06/01/2024: IMPRESSION:  1. Patchy nodular infiltrate within the medial aspect of the right middle lobe, appearing new since prior chest CT 05/19/2024, probably inflammatory/infectious infiltrate. Correlate clinically for pneumonia. Suggest short interval follow-up chest CT.   2.  Small to moderate hiatal hernia with thickening of the distal esophagus, and enlarged 12 mm paraesophageal lymph node. A few mildly prominent gastrohepatic ligament nodes. Correlate clinically for possible esophagitis or other etiology. Consider further evaluation with upper endoscopy.   3.  Grossly stable somewhat ill-defined hypodense lesion in the posterior right hepatic lobe, probably a hemangioma. Consider further evaluation with MRI liver without/with contrast.   4.  Grossly stable tiny 6 mm cystic lesion in the posterior pancreatic head. Attention on follow-up imaging.   5.  Chronic appearing mild atrophy and parenchymal scarring of the left kidney. 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  6.  Grossly stable tiny probable bilateral renal cysts.   7.  Other findings as described.   CT chest lung screening 05/19/24: IMPRESSION:  1. No suspicious pulmonary nodules.  2.  Slight prominence of the contour of the left upper pole kidney which may reflect variation of normal renal contour with underlying indeterminate lesion not excluded. Consider correlation with prior outside studies of the abdomen   Previous workup:   GES: 04/2022 normal    Last EGD:8/23  - 3 cm hiatal hernia. - Esophageal mucosal changes consistent with short-segment Barrett's esophagus, confirmed with biopsy. - Mild Schatzki ring. Dilated. - Gastritis. Biopsied. No h.pylori - Normal duodenal bulb, first portion of the duodenum and second portion of the duodenum. -due EGD in 3 years   Last Colonoscopy: 10/23   - Non-bleeding internal hemorrhoids. - Diverticulosis  in the sigmoid colon. - One 5 mm polyp in the descending colon, removed with a cold snare. Resected and retrieved. - The examination was otherwise normal. -tubular adenomas -next colonoscopy five years   CT A/P with contrast 09/2022: IMPRESSION: 1. Acute uncomplicated sigmoid colonic diverticulitis/colitis. 2. Wedge-shaped hypoenhancing areas in the left lower pole kidney with overlying cortical scarring, suggest correlation with laboratory values for pyelonephritis. 3.  Aortic Atherosclerosis (ICD10-I70.0).   CT chest with contrast 11/2022: IMPRESSION: -Few tiny bilateral lung nodules identified measuring under 5 mm. No follow-up needed if patient is low-risk (and has no known or suspected primary neoplasm). Non-contrast chest CT can be considered in 12 months if patient is high-risk. This recommendation follows the consensus statement: Guidelines for Management of Incidental Pulmonary Nodules Detected on CT Images: From the Fleischner Society 2017; Radiology 2017; 284:228-243. -No developing other mass lesion, fluid collection or lymph node enlargement in the thorax. -Fatty liver infiltration. -Coronary artery calcifications. Please correlate for other coronary risk factors.     Medications   Current Outpatient Medications  Medication Sig Dispense Refill   albuterol  (VENTOLIN  HFA) 108 (90 Base) MCG/ACT inhaler Inhale 2 puffs into the lungs every 6 (six) hours as needed for shortness of breath. 18 g 0   ALPRAZolam  (XANAX ) 0.5 MG tablet Take 0.5 mg by mouth 3 (three) times daily as needed.     divalproex  (DEPAKOTE ) 250 MG DR tablet Take 250 mg by mouth 2 (two) times daily.            OLANZapine  (ZYPREXA ) 10 MG tablet Take half a tablet nightly for 2 weeks. Then increase to full tablet nightly thereafter. 30 tablet 2   torsemide (DEMADEX) 20 MG tablet Take 20 mg by mouth daily.     venlafaxine  XR (EFFEXOR  XR) 75 MG 24 hr capsule Take 1 capsule (75 mg total) by mouth daily with  breakfast. 14 capsule 0   No current facility-administered medications for this visit.    Allergies   Allergies as of 06/11/2024 - Review Complete 06/11/2024  Allergen Reaction Noted   Bee venom Anaphylaxis 03/07/2021   Motrin [ibuprofen] Other (See Comments) 07/18/2022   Codeine Nausea And Vomiting and Rash 05/21/2011   Penicillins Nausea And Vomiting and Rash 05/21/2011   Prilosec [omeprazole] Swelling and Rash 03/15/2013   Zantac [ranitidine] Rash 03/07/2021     Past Medical History   Past Medical History:  Diagnosis Date   Asthma    Barrett's esophagus    COPD (chronic obstructive pulmonary disease) (HCC)    Depression    Hypertension    PONV (postoperative nausea and vomiting)  Seasonal allergies    TIA (transient ischemic attack) 01/2022    Past Surgical History   Past Surgical History:  Procedure Laterality Date   ABDOMINAL HYSTERECTOMY     BIOPSY  05/06/2022   Procedure: BIOPSY;  Surgeon: Cindie Carlin POUR, DO;  [facility]: [facility];  Service: Endoscopy;;   CHOLECYSTECTOMY     COLONOSCOPY  05/21/2011   Procedure: COLONOSCOPY;  Surgeon: Claudis RAYMOND Rivet, MD;  Location: [facility];  Service: Endoscopy;  Laterality: N/A;  12:30   COLONOSCOPY WITH PROPOFOL  N/A 07/23/2022   Procedure: COLONOSCOPY WITH PROPOFOL ;  Surgeon: Cindie Carlin POUR, DO;  [facility]: [facility];  Service: Endoscopy;  Laterality: N/A;  2:00pm, asa 2, pt knows to arrive at 11:30   ESOPHAGEAL DILATION  05/06/2022   Procedure: ESOPHAGEAL DILATION;  Surgeon: Cindie Carlin POUR, DO;  [facility]: [facility];  Service: Endoscopy;;   ESOPHAGOGASTRODUODENOSCOPY     ESOPHAGOGASTRODUODENOSCOPY (EGD) WITH PROPOFOL  N/A 05/06/2022   Procedure: ESOPHAGOGASTRODUODENOSCOPY (EGD) WITH PROPOFOL ;  Surgeon: Cindie Carlin POUR, DO;  [facility];  Service: Endoscopy;  Laterality: N/A;  8:00AM   POLYPECTOMY  07/23/2022   Procedure: POLYPECTOMY;  Surgeon: Cindie Carlin POUR, DO;  [facility]: [facility];   Service: Endoscopy;;   TUBAL LIGATION      Past Family History   Family History  Problem Relation Age of Onset   Hypertension Mother    Hyperlipidemia Mother    Stroke Mother    Kidney cancer Father    Colon cancer Other     Past Social History   Social History   Socioeconomic History   Marital status: Married    Spouse name: Not on file   Number of children: 2   Years of education: 12   Highest education level: High school graduate  Occupational History   Occupation: [employer]  Tobacco Use   Smoking status: Every Day    Average packs/day: 0.5 packs/day for 23.0 years (11.5 ttl pk-yrs)    Types: Cigarettes, E-cigarettes    Start date: 03/2023    Passive exposure: Current   Smokeless tobacco: Never   Tobacco comments:    Quit cigarettes in July 2024 and Vape cartridge lasts 1.5 weeks  Vaping Use   Vaping status: Every Day  Substance and Sexual Activity   Alcohol use: No    Comment: Last drink in 2022   Drug use: No    Comment: See psychiatry note from 06/23/2023   Sexual activity: Yes    Birth control/protection: Surgical  Other Topics Concern   Not on file  Social History Narrative   Lives at home with husband.   Right-handed.   2 cups of caffeine per day.   Social Drivers of Corporate investment banker Strain: Low Risk  (10/21/2023)   Received from [facility]   Overall Financial Resource Strain (CARDIA)    Difficulty of Paying Living Expenses: Not very hard  Food Insecurity: No Food Insecurity (10/21/2023)   Received from [facility]   Hunger Vital Sign    Within the past 12 months, you worried that your food would run out before you got the money to buy more.: Never true    Within the past 12 months, the food you bought just didn't last and you didn't have money to get more.: Never true  Transportation Needs: No Transportation Needs (10/21/2023)   Received from [facility] - Transportation    Lack of Transportation (Medical): No  Lack of Transportation (Non-Medical): No  Physical Activity: Not on file  Stress: Not on file  Social Connections: Unknown (11/14/2022)   [facility]   Social Network    Social Network: Not on file  Intimate Partner Violence: Unknown (11/14/2022)   Received from [facility]   HITS    Physically Hurt: Not on file    Insult or Talk Down To: Not on file    Threaten Physical Harm: Not on file    Scream or Curse: Not on file    Review of Systems   General: Negative for anorexia, weight loss, fever, chills, ++fatigue, weakness. ENT: Negative for hoarseness, difficulty swallowing , nasal congestion. CV: Negative for chest pain, angina, palpitations, dyspnea on exertion, ++peripheral edema.  Respiratory: Negative for dyspnea at rest, ++dyspnea on exertion, ++cough, sputum, wheezing.  GI: See history of present illness. GU:  Negative for dysuria, hematuria, urinary incontinence, urinary frequency, nocturnal urination.  Endo: Negative for unusual weight change.     Physical Exam   BP 115/76 (BP Location: Right Arm, Patient Position: Sitting, Cuff Size: Normal)   Pulse (!) 109   Temp 99 F (37.2 C) (Oral)   Ht 5' 4 (1.626 m)   Wt 179 lb 3.2 oz (81.3 kg)   SpO2 95%   BMI 30.76 kg/m    General: Well-nourished, well-developed in no acute distress.  Eyes: No icterus. Mouth: Oropharyngeal mucosa moist and pink   Lungs: Clear to auscultation bilaterally.  Heart: Regular rate and rhythm, no murmurs rubs or gallops.  Abdomen: Bowel sounds are normal,  nondistended, no hepatosplenomegaly or masses,  no abdominal bruits or hernia , no rebound or guarding. Mild upper abdominal tenderness Rectal: not performed Extremities: trace to 1+ PE lower extremity edema. Clubbing of fingers.  Neuro: Alert and oriented x 4   Skin: Warm and dry, no jaundice.   Psych: Alert and cooperative, normal mood and affect.  Labs   See hpi  Imaging Studies   No results  found.  Assessment/Plan:       Chronic nausea/vomiting: -symptoms started in 2023, seemed to settle down last year but present almost daily for past 6-7 months. Etiology has not really been defined. Previous GES was normal. She initially responded to reglan  but states when this was tried over the recent months, she did not see a responds. She is using phenergan  as needed. She vomits daily. Does not seem to matter what she eats. She has gained weight has outlined above. I doubt the liver or pancreatic lesion has anything to do with her N/V. She has history GERD/Barrett's and is not taking any acid suppression which may be exacerbating her symptoms. Numerous studies in the past couple of years as outlined.  -consider fasting am cortisol level to screen for adrenal insufficiency given vomiting, hypotension, although weight gain does not support the diagnosis -start famotidine  20mg  bid for reflux (she has history of swelling/rash with omeprazole and rash with ranitidine but has tolerated famotidine )  GERD/Barrett's: Last EGD in 2023, planned surveillance in 2026 however with abnormal esophagus on CT and refractory N/V, will update EGD now. -EGD with Dr. Cindie.  ASA 3. Room 1,2.  I have discussed the risks, alternatives, benefits with regards to but not limited to the risk of reaction to medication, bleeding, infection, perforation and the patient is agreeable to proceed. Written consent to be obtained. -famotidine  20mg  BID  Right hepatic lobe lesion: 20mm hypodense lesion in posterior right hepatic lobe on CT 06/01/24,  grossly stable compared to 09/2022 but radiologist advising MRI to better define.  -MRI liver with and without contrast, patient prefers [facility] due to financial concerns  Pancreatic head cystic lesion: Grossly stable, advised attention to follow up on MRI imaging -MRI Liver with and without contrast/pancreas  Weight gain: Patient reports unintentional weight gain and fluid retention.  Believes weight cannot be explains by oral intake reported only retaining about 15% of what she eats.   Looking back at her PCP notes, weight trends: 127 lb (01/2023), 157 lb (05/2023), 157 lb (08/2023), 174 lb (01/2024), 170lb (03/2024), 177 lb (05/04/24). She weighed 179 pounds today. Averaging about a 10-15 pound weight gain each month since 08/2023 (around her baseline at that time). Her thyroid  labs are normal. She has some mild lower extremity edema on exam today. She is using diuretics as needed.   Blood pressure instability with syncopal episodes (hypotension and hypertension) She is being followed by neurology and has seen cardiology previously with plans for return visit. Last seen one year ago.  -now with low BP, increased N/V, screen for adrenal insufficiency.  Kidney findings on CT: Patient/husband had several questions regarding kidney findings on CT and were under the impression that we would advise regarding these findings today. I have encouraged them to follow up with PCP for further recommendations.       Sonny RAMAN. Ezzard, MHS, PA-C [facility]

## 2024-06-11 NOTE — Progress Notes (Signed)
 GI Office Note    Referring Provider: Suanne Pfeiffer, NP Primary Care Physician:  Suanne Pfeiffer, NP  Primary Gastroenterologist: Carlin POUR. Cindie, DO   Chief Complaint   Chief Complaint  Patient presents with   Nausea    States that she has still been throwing up almost every time she eats and is also here for abnormal imaging.    History of Present Illness   [name] is a 58 y.o. female presenting today for follow up of intractable N/V and to address abnormal imaging. Patient was last seen June 2024.  She has a history of chronic intermittent N/V dating back to 2023, chronic GERD/Barrett's, intermittent abdominal pain, weight loss. She was lost to follow up last year.    Discussed the use of AI scribe software for clinical note transcription with the patient, who gave verbal consent to proceed.   She continues to have intractable N/V. Symptoms initially started in 02/2022. She reported at that time symptoms started after doubling her dose of Vraylar . However with eventually stopping medication symptoms did not resolved.  States she has vomiting mostly every day. According to PCP note, her symptoms had settled down a bit until about 7 months ago. She estimates that she is retaining about 15% of her food intake. Vomiting occurs 'every day, all day' with occasional breaks of one to two days. Nausea and vomiting are present regardless of food type. Typically tolerates PopTarts but sometimes will vomit crackers, chicken, fish. Sometimes she tolerates fast food hamburger but sometimes she doesn't. No rhyme or reason. She feels nauseated before vomiting. She reports coming off Reglan  previously but PCP restarted in the recent months, and sh did not find it effective after taking it for two months, three times a day before meals, was ineffective. She uses two phenergan  with severe symptoms and sometimes this does not work. She notes that her PCP has worked hard switching out all of  her medications to see if they were associated with her N/V and nothing has helped.  She wants us  to follow up on her abnormal imaging. She had abnormal esophagus, liver, pancreas as outlined below.   She has a history of Barrett's esophagus and is not currently on any medication for reflux. She previously took Pepcid  but is not on it currently. No heartburn or indigestion, but she reports soreness in her abdomen, which she attributes to vomiting and coughing. She had swelling and rash to omeprazole in the past and has avoided the PPI class. She had rash to ranitidine in the past but tolerates famotidine . She denies melena, brbpr, constipation, diarrhea, dysphagia. No hematemesis. Abdominal pain in upper abdomen from vomiting/coughing.  She is concerned about significant weight gain, increasing from 114 pounds to 179 pounds since she was hospitalized in 2024 due to her vomiting. At her last ov here, she weighed 126 pounds. She reports weighing in the 140 range a few months ago. She attributes her weight gain to fluid retention. She takes a diuretic every other day or daily as needed. She does not understand how she could gain weight with daily vomiting. She drinks water, liquid IV, diet Pepsi (caffeine free).  Looking back at her PCP notes, weight trends: 127 lb (01/2023), 157 lb (05/2023), 157 lb (08/2023), 174 lb (01/2024), 170lb (03/2024), 177 lb (05/04/24).   She complains of fatigue. She can walk short distances without significant SOB. She sleeps in a recliner due to breathing issues and coughing. She states she does  not heal well. She has sores on her arms from her dogs nails that are not healing well. She easily bruises.    Her blood pressure fluctuates significantly, with episodes of hypotension lasting a couple of days, during which she has passed out multiple times. Blood pressure readings have been as low as 78/40 mmHg, with associated high heart rates of 129-130 bpm. She has a history of high blood  pressure requiring medication in the past but ultimately was taken off medications. She was extensively evaluation by cardiology in 2023, worked up for possible pheochromocytoma and hyperaldosteronism, lab testing reassuring but was placed on aldactone  at that time for presumed hyperaldosteronism. Last seen by cardiology in 01/2023.  She has been evaluated by multiple specialists, including a pulmonologist, neurologist, cardiologist, endocrinologist and GI for her symptoms. Most recently seeing the pulmonologist and neurologist.  She states the lung specialist is more concerned about having her N/V addressed. She does have COPD.     Wt Readings from Last 25 Encounters:  06/11/24 179 lb 3.2 oz (81.3 kg)  06/08/24 179 lb 6.4 oz (81.4 kg)  03/12/23 126 lb 9.6 oz (57.4 kg)  01/22/23 128 lb (58.1 kg)  01/16/23 127 lb (57.6 kg)  12/26/22 120 lb (54.4 kg)  11/13/22 118 lb (53.5 kg)  10/31/22 117 lb (53.1 kg)  10/22/22 122 lb (55.3 kg)  10/20/22 117 lb (53.1 kg)  10/13/22 125 lb 10.6 oz (57 kg)  10/11/22 121 lb 9.6 oz (55.2 kg)  10/10/22 120 lb 6.4 oz (54.6 kg)  09/06/22 124 lb 4.8 oz (56.4 kg)  07/23/22 129 lb (58.5 kg)  07/17/22 129 lb (58.5 kg)  05/03/22 137 lb 6.4 oz (62.3 kg)  05/02/22 137 lb 6.4 oz (62.3 kg)  04/15/22 136 lb 8 oz (61.9 kg)  04/11/22 137 lb (62.1 kg)  02/19/22 150 lb 6.4 oz (68.2 kg)  02/05/22 153 lb 9.6 oz (69.7 kg)  01/29/22 158 lb 12.8 oz (72 kg)  01/10/22 156 lb 8 oz (71 kg)  01/08/22 156 lb 6.4 oz (70.9 kg)    Prior Data   Labs from August 2025: TIBC 349, iron 53, iron sats 15, B12 847, glucose 88, creatinine 0.71, sodium 141, potassium 3.8, albumin 4, total bilirubin 0.3, alk phos 81, AST 18, ALT 14, CRP 14 H.  Labs from July 2025: White blood cell count 8.7, hemoglobin 12.8, MCV 94, platelets 282. TSH 1.090.  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   CT abdomen with  and without IV contrast pelvis with IV contrast 06/01/2024: IMPRESSION:  1. Patchy nodular infiltrate within the medial aspect of the right middle lobe, appearing new since prior chest CT 05/19/2024, probably inflammatory/infectious infiltrate. Correlate clinically for pneumonia. Suggest short interval follow-up chest CT.   2.  Small to moderate hiatal hernia with thickening of the distal esophagus, and enlarged 12 mm paraesophageal lymph node. A few mildly prominent gastrohepatic ligament nodes. Correlate clinically for possible esophagitis or other etiology. Consider further evaluation with upper endoscopy.   3.  Grossly stable somewhat ill-defined hypodense lesion in the posterior right hepatic lobe, probably a hemangioma. Consider further evaluation with MRI liver without/with contrast.   4.  Grossly stable tiny 6 mm cystic lesion in the posterior pancreatic head. Attention on follow-up imaging.   5.  Chronic appearing mild atrophy and parenchymal scarring of the left kidney. 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  6.  Grossly stable tiny probable bilateral renal cysts.   7.  Other findings as described.   CT chest lung screening 05/19/24: IMPRESSION:  1. No suspicious pulmonary nodules.  2.  Slight prominence of the contour of the left upper pole kidney which may reflect variation of normal renal contour with underlying indeterminate lesion not excluded. Consider correlation with prior outside studies of the abdomen   Previous workup:   GES: 04/2022 normal    Last EGD:8/23  - 3 cm hiatal hernia. - Esophageal mucosal changes consistent with short-segment Barrett's esophagus, confirmed with biopsy. - Mild Schatzki ring. Dilated. - Gastritis. Biopsied. No h.pylori - Normal duodenal bulb, first portion of the duodenum and second portion of the duodenum. -due EGD in 3 years   Last Colonoscopy: 10/23   - Non-bleeding internal hemorrhoids. - Diverticulosis  in the sigmoid colon. - One 5 mm polyp in the descending colon, removed with a cold snare. Resected and retrieved. - The examination was otherwise normal. -tubular adenomas -next colonoscopy five years   CT A/P with contrast 09/2022: IMPRESSION: 1. Acute uncomplicated sigmoid colonic diverticulitis/colitis. 2. Wedge-shaped hypoenhancing areas in the left lower pole kidney with overlying cortical scarring, suggest correlation with laboratory values for pyelonephritis. 3.  Aortic Atherosclerosis (ICD10-I70.0).   CT chest with contrast 11/2022: IMPRESSION: -Few tiny bilateral lung nodules identified measuring under 5 mm. No follow-up needed if patient is low-risk (and has no known or suspected primary neoplasm). Non-contrast chest CT can be considered in 12 months if patient is high-risk. This recommendation follows the consensus statement: Guidelines for Management of Incidental Pulmonary Nodules Detected on CT Images: From the Fleischner Society 2017; Radiology 2017; 284:228-243. -No developing other mass lesion, fluid collection or lymph node enlargement in the thorax. -Fatty liver infiltration. -Coronary artery calcifications. Please correlate for other coronary risk factors.     Medications   Current Outpatient Medications  Medication Sig Dispense Refill   albuterol  (VENTOLIN  HFA) 108 (90 Base) MCG/ACT inhaler Inhale 2 puffs into the lungs every 6 (six) hours as needed for shortness of breath. 18 g 0   ALPRAZolam  (XANAX ) 0.5 MG tablet Take 0.5 mg by mouth 3 (three) times daily as needed.     divalproex  (DEPAKOTE ) 250 MG DR tablet Take 250 mg by mouth 2 (two) times daily.            OLANZapine  (ZYPREXA ) 10 MG tablet Take half a tablet nightly for 2 weeks. Then increase to full tablet nightly thereafter. 30 tablet 2   torsemide (DEMADEX) 20 MG tablet Take 20 mg by mouth daily.     venlafaxine  XR (EFFEXOR  XR) 75 MG 24 hr capsule Take 1 capsule (75 mg total) by mouth daily with  breakfast. 14 capsule 0   No current facility-administered medications for this visit.    Allergies   Allergies as of 06/11/2024 - Review Complete 06/11/2024  Allergen Reaction Noted   Bee venom Anaphylaxis 03/07/2021   Motrin [ibuprofen] Other (See Comments) 07/18/2022   Codeine Nausea And Vomiting and Rash 05/21/2011   Penicillins Nausea And Vomiting and Rash 05/21/2011   Prilosec [omeprazole] Swelling and Rash 03/15/2013   Zantac [ranitidine] Rash 03/07/2021     Past Medical History   Past Medical History:  Diagnosis Date   Asthma    Barrett's esophagus    COPD (chronic obstructive pulmonary disease) (HCC)    Depression    Hypertension    PONV (postoperative nausea and vomiting)  Seasonal allergies    TIA (transient ischemic attack) 01/2022    Past Surgical History   Past Surgical History:  Procedure Laterality Date   ABDOMINAL HYSTERECTOMY     BIOPSY  05/06/2022   Procedure: BIOPSY;  Surgeon: Cindie Carlin POUR, DO;  [facility];  Service: Endoscopy;;   CHOLECYSTECTOMY     COLONOSCOPY  05/21/2011   Procedure: COLONOSCOPY;  Surgeon: Claudis RAYMOND Rivet, MD;  Location: [facility];  Service: Endoscopy;  Laterality: N/A;  12:30   COLONOSCOPY WITH PROPOFOL  N/A 07/23/2022   Procedure: COLONOSCOPY WITH PROPOFOL ;  Surgeon: Cindie Carlin POUR, DO;  [facility]: [facility];  Service: Endoscopy;  Laterality: N/A;  2:00pm, asa 2, pt knows to arrive at 11:30   ESOPHAGEAL DILATION  05/06/2022   Procedure: ESOPHAGEAL DILATION;  Surgeon: Cindie Carlin POUR, DO;  [facility];  Service: Endoscopy;;   ESOPHAGOGASTRODUODENOSCOPY     ESOPHAGOGASTRODUODENOSCOPY (EGD) WITH PROPOFOL  N/A 05/06/2022   Procedure: ESOPHAGOGASTRODUODENOSCOPY (EGD) WITH PROPOFOL ;  Surgeon: Cindie Carlin POUR, DO;  [facility];  Service: Endoscopy;  Laterality: N/A;  8:00AM   POLYPECTOMY  07/23/2022   Procedure: POLYPECTOMY;  Surgeon: Cindie Carlin POUR, DO;  [facility]: [facility];   Service: Endoscopy;;   TUBAL LIGATION      Past Family History   Family History  Problem Relation Age of Onset   Hypertension Mother    Hyperlipidemia Mother    Stroke Mother    Kidney cancer Father    Colon cancer Other     Past Social History   Social History   Socioeconomic History   Marital status: Married    Spouse name: Not on file   Number of children: 2   Years of education: 12   Highest education level: High school graduate  Occupational History   Occupation: [employer]  Tobacco Use   Smoking status: Every Day    Average packs/day: 0.5 packs/day for 23.0 years (11.5 ttl pk-yrs)    Types: Cigarettes, E-cigarettes    Start date: 03/2023    Passive exposure: Current   Smokeless tobacco: Never   Tobacco comments:    Quit cigarettes in July 2024 and Vape cartridge lasts 1.5 weeks  Vaping Use   Vaping status: Every Day  Substance and Sexual Activity   Alcohol use: No    Comment: Last drink in 2022   Drug use: No    Comment: See psychiatry note from 06/23/2023   Sexual activity: Yes    Birth control/protection: Surgical  Other Topics Concern   Not on file  Social History Narrative   Lives at home with husband.   Right-handed.   2 cups of caffeine per day.   Social Drivers of Corporate investment banker Strain: Low Risk  (10/21/2023)   Received from [facility]   Overall Financial Resource Strain (CARDIA)    Difficulty of Paying Living Expenses: Not very hard  Food Insecurity: No Food Insecurity (10/21/2023)   Received from [facility]   Hunger Vital Sign    Within the past 12 months, you worried that your food would run out before you got the money to buy more.: Never true    Within the past 12 months, the food you bought just didn't last and you didn't have money to get more.: Never true  Transportation Needs: No Transportation Needs (10/21/2023)   Received from [facility] - Transportation    Lack of Transportation (Medical): No  Lack of Transportation (Non-Medical): No  Physical Activity: Not on file  Stress: Not on file  Social Connections: Unknown (11/14/2022)   [facility]   Social Network    Social Network: Not on file  Intimate Partner Violence: Unknown (11/14/2022)   Received from [facility]   HITS    Physically Hurt: Not on file    Insult or Talk Down To: Not on file    Threaten Physical Harm: Not on file    Scream or Curse: Not on file    Review of Systems   General: Negative for anorexia, weight loss, fever, chills, ++fatigue, weakness. ENT: Negative for hoarseness, difficulty swallowing , nasal congestion. CV: Negative for chest pain, angina, palpitations, dyspnea on exertion, ++peripheral edema.  Respiratory: Negative for dyspnea at rest, ++dyspnea on exertion, ++cough, sputum, wheezing.  GI: See history of present illness. GU:  Negative for dysuria, hematuria, urinary incontinence, urinary frequency, nocturnal urination.  Endo: Negative for unusual weight change.     Physical Exam   BP 115/76 (BP Location: Right Arm, Patient Position: Sitting, Cuff Size: Normal)   Pulse (!) 109   Temp 99 F (37.2 C) (Oral)   Ht 5' 4 (1.626 m)   Wt 179 lb 3.2 oz (81.3 kg)   SpO2 95%   BMI 30.76 kg/m    General: Well-nourished, well-developed in no acute distress.  Eyes: No icterus. Mouth: Oropharyngeal mucosa moist and pink   Lungs: Clear to auscultation bilaterally.  Heart: Regular rate and rhythm, no murmurs rubs or gallops.  Abdomen: Bowel sounds are normal,  nondistended, no hepatosplenomegaly or masses,  no abdominal bruits or hernia , no rebound or guarding. Mild upper abdominal tenderness Rectal: not performed Extremities: trace to 1+ PE lower extremity edema. Clubbing of fingers.  Neuro: Alert and oriented x 4   Skin: Warm and dry, no jaundice.   Psych: Alert and cooperative, normal mood and affect.  Labs   See hpi  Imaging Studies   No results  found.  Assessment/Plan:       Chronic nausea/vomiting: -symptoms started in 2023, seemed to settle down last year but present almost daily for past 6-7 months. Etiology has not really been defined. Previous GES was normal. She initially responded to reglan  but states when this was tried over the recent months, she did not see a responds. She is using phenergan  as needed. She vomits daily. Does not seem to matter what she eats. She has gained weight has outlined above. I doubt the liver or pancreatic lesion has anything to do with her N/V. She has history GERD/Barrett's and is not taking any acid suppression which may be exacerbating her symptoms. Numerous studies in the past couple of years as outlined.  -consider fasting am cortisol level to screen for adrenal insufficiency given vomiting, hypotension, although weight gain does not support the diagnosis -start famotidine  20mg  bid for reflux (she has history of swelling/rash with omeprazole and rash with ranitidine but has tolerated famotidine )  GERD/Barrett's: Last EGD in 2023, planned surveillance in 2026 however with abnormal esophagus on CT and refractory N/V, will update EGD now. -EGD with Dr. Cindie.  ASA 3. Room 1,2.  I have discussed the risks, alternatives, benefits with regards to but not limited to the risk of reaction to medication, bleeding, infection, perforation and the patient is agreeable to proceed. Written consent to be obtained. -famotidine  20mg  BID  Right hepatic lobe lesion: 20mm hypodense lesion in posterior right hepatic lobe on CT 06/01/24,  grossly stable compared to 09/2022 but radiologist advising MRI to better define.  -MRI liver with and without contrast, patient prefers [facility] due to financial concerns  Pancreatic head cystic lesion: Grossly stable, advised attention to follow up on MRI imaging -MRI Liver with and without contrast/pancreas  Weight gain: Patient reports unintentional weight gain and fluid retention.  Believes weight cannot be explains by oral intake reported only retaining about 15% of what she eats.   Looking back at her PCP notes, weight trends: 127 lb (01/2023), 157 lb (05/2023), 157 lb (08/2023), 174 lb (01/2024), 170lb (03/2024), 177 lb (05/04/24). She weighed 179 pounds today. Averaging about a 10-15 pound weight gain each month since 08/2023 (around her baseline at that time). Her thyroid  labs are normal. She has some mild lower extremity edema on exam today. She is using diuretics as needed.   Blood pressure instability with syncopal episodes (hypotension and hypertension) She is being followed by neurology and has seen cardiology previously with plans for return visit. Last seen one year ago.  -now with low BP, increased N/V, screen for adrenal insufficiency.  Kidney findings on CT: Patient/husband had several questions regarding kidney findings on CT and were under the impression that we would advise regarding these findings today. I have encouraged them to follow up with PCP for further recommendations.       Sonny RAMAN. Ezzard, MHS, PA-C [facility]

## 2024-06-11 NOTE — Patient Instructions (Signed)
 Upper endoscopy to be scheduled with Dr. Cindie.   We will get you scheduled for MRI liver at [facility] as discussed. They will look at your liver lesion and pancreatic cyst.   I will look into possible adrenal work up.  Please start Pepcid  20mg  twice daily.

## 2024-06-14 ENCOUNTER — Encounter: Payer: Self-pay | Admitting: *Deleted

## 2024-06-14 ENCOUNTER — Telehealth: Payer: Self-pay | Admitting: *Deleted

## 2024-06-14 ENCOUNTER — Other Ambulatory Visit: Payer: Self-pay

## 2024-06-14 DIAGNOSIS — E2740 Unspecified adrenocortical insufficiency: Secondary | ICD-10-CM

## 2024-06-14 NOTE — Telephone Encounter (Signed)
 Lab was ordered and pt was made aware and verbalized understanding.

## 2024-06-14 NOTE — Telephone Encounter (Signed)
 Order for MRI faxed to [facility] per pt's request. They will call pt to schedule.

## 2024-06-17 ENCOUNTER — Ambulatory Visit (HOSPITAL_COMMUNITY): Payer: Self-pay | Admitting: Certified Registered"

## 2024-06-17 ENCOUNTER — Ambulatory Visit (HOSPITAL_COMMUNITY)
Admission: RE | Admit: 2024-06-17 | Discharge: 2024-06-17 | Disposition: A | Discharge status: Home / Self Care | Payer: Self-pay | Attending: Internal Medicine | Admitting: Internal Medicine

## 2024-06-17 ENCOUNTER — Other Ambulatory Visit: Payer: Self-pay

## 2024-06-17 ENCOUNTER — Encounter (HOSPITAL_COMMUNITY)
Admission: RE | Disposition: A | Discharge status: Home / Self Care | Payer: Self-pay | Source: Home / Self Care | Attending: Internal Medicine

## 2024-06-17 ENCOUNTER — Encounter (HOSPITAL_COMMUNITY): Payer: Self-pay | Admitting: Internal Medicine

## 2024-06-17 DIAGNOSIS — I10 Essential (primary) hypertension: Secondary | ICD-10-CM

## 2024-06-17 DIAGNOSIS — K2950 Unspecified chronic gastritis without bleeding: Secondary | ICD-10-CM | POA: Insufficient documentation

## 2024-06-17 DIAGNOSIS — K259 Gastric ulcer, unspecified as acute or chronic, without hemorrhage or perforation: Secondary | ICD-10-CM | POA: Insufficient documentation

## 2024-06-17 DIAGNOSIS — K862 Cyst of pancreas: Secondary | ICD-10-CM | POA: Insufficient documentation

## 2024-06-17 DIAGNOSIS — K209 Esophagitis, unspecified without bleeding: Secondary | ICD-10-CM

## 2024-06-17 DIAGNOSIS — F17210 Nicotine dependence, cigarettes, uncomplicated: Secondary | ICD-10-CM

## 2024-06-17 DIAGNOSIS — R933 Abnormal findings on diagnostic imaging of other parts of digestive tract: Secondary | ICD-10-CM | POA: Insufficient documentation

## 2024-06-17 DIAGNOSIS — F419 Anxiety disorder, unspecified: Secondary | ICD-10-CM | POA: Insufficient documentation

## 2024-06-17 DIAGNOSIS — K3189 Other diseases of stomach and duodenum: Secondary | ICD-10-CM

## 2024-06-17 DIAGNOSIS — K2970 Gastritis, unspecified, without bleeding: Secondary | ICD-10-CM

## 2024-06-17 DIAGNOSIS — J4489 Other specified chronic obstructive pulmonary disease: Secondary | ICD-10-CM | POA: Insufficient documentation

## 2024-06-17 DIAGNOSIS — Z79899 Other long term (current) drug therapy: Secondary | ICD-10-CM | POA: Insufficient documentation

## 2024-06-17 DIAGNOSIS — K2270 Barrett's esophagus without dysplasia: Secondary | ICD-10-CM | POA: Insufficient documentation

## 2024-06-17 DIAGNOSIS — F17290 Nicotine dependence, other tobacco product, uncomplicated: Secondary | ICD-10-CM | POA: Insufficient documentation

## 2024-06-17 DIAGNOSIS — R12 Heartburn: Secondary | ICD-10-CM | POA: Insufficient documentation

## 2024-06-17 DIAGNOSIS — K2100 Gastro-esophageal reflux disease with esophagitis, without bleeding: Secondary | ICD-10-CM | POA: Insufficient documentation

## 2024-06-17 DIAGNOSIS — K449 Diaphragmatic hernia without obstruction or gangrene: Secondary | ICD-10-CM | POA: Insufficient documentation

## 2024-06-17 DIAGNOSIS — G459 Transient cerebral ischemic attack, unspecified: Secondary | ICD-10-CM

## 2024-06-17 DIAGNOSIS — K769 Liver disease, unspecified: Secondary | ICD-10-CM | POA: Insufficient documentation

## 2024-06-17 DIAGNOSIS — Z8673 Personal history of transient ischemic attack (TIA), and cerebral infarction without residual deficits: Secondary | ICD-10-CM | POA: Insufficient documentation

## 2024-06-17 DIAGNOSIS — R112 Nausea with vomiting, unspecified: Secondary | ICD-10-CM | POA: Insufficient documentation

## 2024-06-17 DIAGNOSIS — F319 Bipolar disorder, unspecified: Secondary | ICD-10-CM | POA: Insufficient documentation

## 2024-06-17 HISTORY — PX: ESOPHAGOGASTRODUODENOSCOPY: SHX5428

## 2024-06-17 SURGERY — EGD (ESOPHAGOGASTRODUODENOSCOPY)
Anesthesia: General

## 2024-06-17 MED ORDER — LIDOCAINE HCL (CARDIAC) PF 100 MG/5ML IV SOSY
PREFILLED_SYRINGE | INTRAVENOUS | Status: DC | PRN
  Administered 2024-06-17: 50 mg via INTRAVENOUS

## 2024-06-17 MED ORDER — PROPOFOL 10 MG/ML IV BOLUS
INTRAVENOUS | Status: DC | PRN
  Administered 2024-06-17: 100 mg via INTRAVENOUS
  Administered 2024-06-17 (×2): 50 mg via INTRAVENOUS

## 2024-06-17 MED ORDER — VOQUEZNA 20 MG PO TABS: 20.0000 mg | ORAL_TABLET | Freq: Every day | ORAL | 11 refills | Status: DC

## 2024-06-17 MED ORDER — LACTATED RINGERS IV SOLN: INTRAVENOUS | Status: DC | PRN

## 2024-06-17 MED ORDER — VOQUEZNA 20 MG PO TABS: 20.0000 mg | ORAL_TABLET | Freq: Every day | ORAL | Status: DC

## 2024-06-17 NOTE — Op Note (Signed)
 [facility] Patient Name: [name] Procedure Date: 06/17/2024 12:02 PM MRN: [id number] Date of Birth: [birth year] Attending MD: Carlin POUR. Cindie , [place], [id number] CSN: [id number] Age: 58 Admit Type: Outpatient Procedure:                Upper GI endoscopy Indications:              Heartburn, Abnormal CT of the GI tract, Nausea with                            vomiting Providers:                Carlin POUR. Cindie, DO, Leandrew Edelman RN, RN, Jon Loge Referring MD:              Medicines:                See the Anesthesia note for documentation of the                            administered medications Complications:            No immediate complications. Estimated Blood Loss:     Estimated blood loss was minimal. Procedure:                Pre-Anesthesia Assessment:                           - The anesthesia plan was to use monitored                            anesthesia care (MAC).                           After obtaining informed consent, the endoscope was                            passed under direct vision. Throughout the                            procedure, the patient's blood pressure, pulse, and                            oxygen saturations were monitored continuously. The                            [id number] ([id number]) Upper was introduced through                            the mouth, and advanced to the second part of                            duodenum. The upper GI endoscopy was accomplished                            without difficulty. The patient  tolerated the                            procedure well. Scope In: 12:50:42 PM Scope Out: 12:54:46 PM Total Procedure Duration: 0 hours 4 minutes 4 seconds  Findings:      LA Grade D (one or more mucosal breaks involving at least 75% of       esophageal circumference) esophagitis with no bleeding was found 19 to       30 cm from the incisors. Biopsies were taken with a cold forceps for        histology.      A medium-sized hiatal hernia with multiple Cameron ulcers was found.      Patchy mild inflammation characterized by erythema was found in the       entire examined stomach. Biopsies were taken with a cold forceps for       Helicobacter pylori testing.      The duodenal bulb, first portion of the duodenum and second portion of       the duodenum were normal. Impression:               - LA Grade D erosive esophagitis with no bleeding.                            Biopsied.                           - Medium-sized hiatal hernia with multiple Cameron                            ulcers.                           - Gastritis. Biopsied.                           - Normal duodenal bulb, first portion of the                            duodenum and second portion of the duodenum. Moderate Sedation:      Per Anesthesia Care Recommendation:           - Patient has a contact number available for                            emergencies. The signs and symptoms of potential                            delayed complications were discussed with the                            patient. Return to normal activities tomorrow.                            Written discharge instructions were provided to the                            patient.                           -  Resume previous diet.                           - Continue present medications.                           - Await pathology results.                           - Repeat upper endoscopy in 12 weeks to evaluate                            the response to therapy.                           - Return to GI clinic in 8 weeks.                           - Patient with allergy to Omeprazole (hives,                            trouble breathing). Will start on Voquezna  20 mg                            daily. Procedure Code(s):        --- Professional ---                           [phone], Esophagogastroduodenoscopy, flexible,                             transoral; with biopsy, single or multiple Diagnosis Code(s):        --- Professional ---                           K20.80, Other esophagitis without bleeding                           K44.9, Diaphragmatic hernia without obstruction or                            gangrene                           K25.9, Gastric ulcer, unspecified as acute or                            chronic, without hemorrhage or perforation                           K29.70, Gastritis, unspecified, without bleeding                           R12, Heartburn                           R11.2, Nausea with vomiting, unspecified  R93.3, Abnormal findings on diagnostic imaging of                            other parts of digestive tract CPT copyright 2022 American [employer] Association. All rights reserved. The codes documented in this report are preliminary and upon coder review may  be revised to meet current compliance requirements. Carlin POUR. Cindie, DO Carlin POUR. Cindie, DO 06/17/2024 1:15:27 PM This report has been signed electronically. Number of Addenda: 0

## 2024-06-17 NOTE — Discharge Instructions (Addendum)
 EGD Discharge instructions Please read the instructions outlined below and refer to this sheet in the next few weeks. These discharge instructions provide you with general information on caring for yourself after you leave the hospital. Your doctor may also give you specific instructions. While your treatment has been planned according to the most current medical practices available, unavoidable complications occasionally occur. If you have any problems or questions after discharge, please call your doctor. ACTIVITY You may resume your regular activity but move at a slower pace for the next 24 hours.  Take frequent rest periods for the next 24 hours.  Walking will help expel (get rid of) the air and reduce the bloated feeling in your abdomen.  No driving for 24 hours (because of the anesthesia (medicine) used during the test).  You may shower.  Do not sign any important legal documents or operate any machinery for 24 hours (because of the anesthesia used during the test).  NUTRITION Drink plenty of fluids.  You may resume your normal diet.  Begin with a light meal and progress to your normal diet.  Avoid alcoholic beverages for 24 hours or as instructed by your caregiver.  MEDICATIONS You may resume your normal medications unless your caregiver tells you otherwise.  WHAT YOU CAN EXPECT TODAY You may experience abdominal discomfort such as a feeling of fullness or "gas" pains.  FOLLOW-UP Your doctor will discuss the results of your test with you.  SEEK IMMEDIATE MEDICAL ATTENTION IF ANY OF THE FOLLOWING OCCUR: Excessive nausea (feeling sick to your stomach) and/or vomiting.  Severe abdominal pain and distention (swelling).  Trouble swallowing.  Temperature over 101 F (37.8 C).  Rectal bleeding or vomiting of blood.   Your upper endoscopy revealed severe reflux esophagitis.  I took samples of this today.  You also have multiple ulcerations of your stomach which I also took samples of to  rule out infection with a bacteria called H. pylori.  Small bowel appeared normal. Await pathology results. My office will contact you.  I am going to start you on Voquezna  20 mg daily.  You can pick up samples from [employer].  I am also going to send in a formal prescription to their specialty pharmacy called [facility].  They will be reaching out to you about possible patient assistance.  We will need to repeat EGD in approximately 3 months.  Follow-up in GI office in 8 weeks.   I hope you have a great rest of your week!  [name]. [name], D.O. Gastroenterology and Hepatology [facility]

## 2024-06-17 NOTE — Interval H&P Note (Signed)
 History and Physical Interval Note:  06/17/2024 12:41 PM  [name]  has presented today for surgery, with the diagnosis of abnormal esophagus on CT, N/V,Barrett's esophagus.  The various methods of treatment have been discussed with the patient and family. After consideration of risks, benefits and other options for treatment, the patient has consented to  Procedure(s) with comments: EGD (ESOPHAGOGASTRODUODENOSCOPY) (N/A) - 1:30 pm, ok rm 1-2 as a surgical intervention.  The patient's history has been reviewed, patient examined, no change in status, stable for surgery.  I have reviewed the patient's chart and labs.  Questions were answered to the patient's satisfaction.     [name]

## 2024-06-17 NOTE — Transfer of Care (Signed)
 Immediate Anesthesia Transfer of Care Note  Patient: [name]  Procedure(s) Performed: EGD (ESOPHAGOGASTRODUODENOSCOPY)  Patient Location: Endoscopy Unit  Anesthesia Type:General  Level of Consciousness: drowsy, patient cooperative, and responds to stimulation  Airway & Oxygen Therapy: Patient Spontanous Breathing  Post-op Assessment: Report given to RN and Post -op Vital signs reviewed and stable  Post vital signs: Reviewed and stable  Last Vitals:  Vitals Value Taken Time  BP 111/73 06/17/24 12:57  Temp 36.6 C 06/17/24 12:57  Pulse 101 06/17/24 12:57  Resp 21 06/17/24 12:57  SpO2 97 % 06/17/24 12:57    Last Pain:  Vitals:   06/17/24 1257  TempSrc: Oral  PainSc:          Complications: No notable events documented.

## 2024-06-17 NOTE — Progress Notes (Signed)
 Medication Samples have been provided to the patient.  Drug name: Voquezna        Strength: 20mg         Qty: 4  LOT: [id number]  Exp.Date: 07/23/2025  Dosing instructions: Take one tablet daily  The patient has been instructed regarding the correct time, dose, and frequency of taking this medication, including desired effects and most common side effects.   Krystyna Cleckley M Soniya Ashraf 1:32 PM 06/17/2024   Medication Samples have been provided to the patient.  Drug name: voquezna        Strength: 20mg         Qty: 1  LOT: [id number]  Exp.Date: 08/22/2024  Dosing instructions: Take one tablet daily  The patient has been instructed regarding the correct time, dose, and frequency of taking this medication, including desired effects and most common side effects.   Yarden Hillis CHRISTELLA Boom 1:33 PM 06/17/2024   Medication Samples have been provided to the patient.  Drug name: Voquezna        Strength: 20mg         Qty: 1  LOT: [id number]  Exp.Date: 07/23/2025  Dosing instructions: Take one tablet daily  The patient has been instructed regarding the correct time, dose, and frequency of taking this medication, including desired effects and most common side effects.   Madelin CHRISTELLA Boom 1:34 PM 06/17/2024

## 2024-06-18 ENCOUNTER — Encounter (HOSPITAL_COMMUNITY): Payer: Self-pay | Admitting: Internal Medicine

## 2024-06-18 LAB — CORTISOL-AM, BLOOD: Cortisol - AM: 16.4 ug/dL (ref 6.2–19.4)

## 2024-06-18 LAB — SURGICAL PATHOLOGY

## 2024-06-18 NOTE — Anesthesia Preprocedure Evaluation (Signed)
 Anesthesia Evaluation  Patient identified by MRN, date of birth, ID band Patient awake    Reviewed: Allergy & Precautions, H&P , NPO status , Patient's Chart, lab work & pertinent test results, reviewed documented beta blocker date and time   History of Anesthesia Complications (+) PONV and history of anesthetic complications  Airway Mallampati: II  TM Distance: >3 FB Neck ROM: full    Dental no notable dental hx.    Pulmonary neg pulmonary ROS, asthma , COPD, Current Smoker and Patient abstained from smoking.   Pulmonary exam normal breath sounds clear to auscultation       Cardiovascular Exercise Tolerance: Good hypertension, + DOE  negative cardio ROS  Rhythm:regular Rate:Normal     Neuro/Psych  PSYCHIATRIC DISORDERS Anxiety Depression Bipolar Disorder   TIAnegative neurological ROS  negative psych ROS   GI/Hepatic negative GI ROS, Neg liver ROS,GERD  ,,  Endo/Other  negative endocrine ROS    Renal/GU negative Renal ROS  negative genitourinary   Musculoskeletal   Abdominal   Peds  Hematology negative hematology ROS (+)   Anesthesia Other Findings   Reproductive/Obstetrics negative OB ROS                              Anesthesia Physical Anesthesia Plan  ASA: 3  Anesthesia Plan: General   Post-op Pain Management:    Induction:   PONV Risk Score and Plan: Propofol  infusion  Airway Management Planned:   Additional Equipment:   Intra-op Plan:   Post-operative Plan:   Informed Consent: I have reviewed the patients History and Physical, chart, labs and discussed the procedure including the risks, benefits and alternatives for the proposed anesthesia with the patient or authorized representative who has indicated his/her understanding and acceptance.     Dental Advisory Given  Plan Discussed with: CRNA  Anesthesia Plan Comments:         Anesthesia Quick  Evaluation

## 2024-06-18 NOTE — Anesthesia Postprocedure Evaluation (Signed)
 Anesthesia Post Note  Patient: [name]  Procedure(s) Performed: EGD (ESOPHAGOGASTRODUODENOSCOPY)  Patient location during evaluation: Phase II Anesthesia Type: General Level of consciousness: awake Pain management: pain level controlled Vital Signs Assessment: post-procedure vital signs reviewed and stable Respiratory status: spontaneous breathing and respiratory function stable Cardiovascular status: blood pressure returned to baseline and stable Postop Assessment: no headache and no apparent nausea or vomiting Anesthetic complications: no Comments: Late entry   No notable events documented.   Last Vitals:  Vitals:   06/17/24 1257 06/17/24 1426  BP: 111/73 (!) 156/92  Pulse: (!) 101 (!) 102  Resp: (!) 21 (!) 23  Temp: 36.6 C 37 C  SpO2: 97% 95%    Last Pain:  Vitals:   06/17/24 1426  TempSrc: Oral  PainSc:                  Yvonna JINNY Bosworth

## 2024-06-23 ENCOUNTER — Ambulatory Visit: Payer: Self-pay | Admitting: Gastroenterology

## 2024-07-01 ENCOUNTER — Ambulatory Visit: Payer: Self-pay | Admitting: Internal Medicine

## 2024-07-13 ENCOUNTER — Telehealth: Payer: Self-pay

## 2024-07-13 NOTE — Telephone Encounter (Signed)
 Dena, Dr. Cindie wrote the RX.

## 2024-07-13 NOTE — Telephone Encounter (Signed)
 Pt left message on the phone stating to please send her Rx for Voquenza to [facility] instead of [facility].

## 2024-07-26 ENCOUNTER — Other Ambulatory Visit: Payer: Self-pay

## 2024-07-26 MED ORDER — VOQUEZNA 20 MG PO TABS: 20.0000 mg | ORAL_TABLET | Freq: Every day | ORAL | 11 refills | Status: DC

## 2024-07-26 NOTE — Telephone Encounter (Signed)
 Rx was sent to [facility] per pt request.

## 2024-08-01 ENCOUNTER — Emergency Department (HOSPITAL_COMMUNITY): Payer: Self-pay

## 2024-08-01 ENCOUNTER — Other Ambulatory Visit: Payer: Self-pay

## 2024-08-01 ENCOUNTER — Encounter (HOSPITAL_COMMUNITY): Payer: Self-pay

## 2024-08-01 ENCOUNTER — Observation Stay (HOSPITAL_COMMUNITY): Admission: EM | Admit: 2024-08-01 | Discharge: 2024-08-02 | Disposition: A | Payer: Self-pay

## 2024-08-01 DIAGNOSIS — K219 Gastro-esophageal reflux disease without esophagitis: Secondary | ICD-10-CM | POA: Diagnosis present

## 2024-08-01 DIAGNOSIS — R002 Palpitations: Secondary | ICD-10-CM | POA: Diagnosis present

## 2024-08-01 DIAGNOSIS — F17210 Nicotine dependence, cigarettes, uncomplicated: Secondary | ICD-10-CM | POA: Insufficient documentation

## 2024-08-01 DIAGNOSIS — I10 Essential (primary) hypertension: Secondary | ICD-10-CM | POA: Diagnosis present

## 2024-08-01 DIAGNOSIS — E8721 Acute metabolic acidosis: Secondary | ICD-10-CM | POA: Insufficient documentation

## 2024-08-01 DIAGNOSIS — I471 Supraventricular tachycardia, unspecified: Secondary | ICD-10-CM | POA: Diagnosis present

## 2024-08-01 DIAGNOSIS — J449 Chronic obstructive pulmonary disease, unspecified: Secondary | ICD-10-CM | POA: Insufficient documentation

## 2024-08-01 DIAGNOSIS — Z79899 Other long term (current) drug therapy: Secondary | ICD-10-CM | POA: Insufficient documentation

## 2024-08-01 DIAGNOSIS — I479 Paroxysmal tachycardia, unspecified: Secondary | ICD-10-CM | POA: Insufficient documentation

## 2024-08-01 DIAGNOSIS — E872 Acidosis, unspecified: Secondary | ICD-10-CM | POA: Diagnosis present

## 2024-08-01 DIAGNOSIS — F17200 Nicotine dependence, unspecified, uncomplicated: Secondary | ICD-10-CM | POA: Diagnosis present

## 2024-08-01 DIAGNOSIS — J441 Chronic obstructive pulmonary disease with (acute) exacerbation: Principal | ICD-10-CM

## 2024-08-01 DIAGNOSIS — F411 Generalized anxiety disorder: Secondary | ICD-10-CM | POA: Diagnosis present

## 2024-08-01 DIAGNOSIS — F319 Bipolar disorder, unspecified: Secondary | ICD-10-CM | POA: Diagnosis present

## 2024-08-01 DIAGNOSIS — J9601 Acute respiratory failure with hypoxia: Principal | ICD-10-CM | POA: Diagnosis present

## 2024-08-01 DIAGNOSIS — Z8679 Personal history of other diseases of the circulatory system: Secondary | ICD-10-CM | POA: Insufficient documentation

## 2024-08-01 DIAGNOSIS — Z716 Tobacco abuse counseling: Secondary | ICD-10-CM | POA: Insufficient documentation

## 2024-08-01 LAB — CBC
HCT: 36.8 % (ref 36.0–46.0)
Hemoglobin: 11.8 g/dL — ABNORMAL LOW (ref 12.0–15.0)
MCH: 29.1 pg (ref 26.0–34.0)
MCHC: 32.1 g/dL (ref 30.0–36.0)
MCV: 90.6 fL (ref 80.0–100.0)
Platelets: 320 K/uL (ref 150–400)
RBC: 4.06 MIL/uL (ref 3.87–5.11)
RDW: 13.6 % (ref 11.5–15.5)
WBC: 11.4 K/uL — ABNORMAL HIGH (ref 4.0–10.5)
nRBC: 0 % (ref 0.0–0.2)

## 2024-08-01 LAB — BASIC METABOLIC PANEL WITH GFR
Anion gap: 12 (ref 5–15)
BUN: 8 mg/dL (ref 6–20)
CO2: 29 mmol/L (ref 22–32)
Calcium: 9.4 mg/dL (ref 8.9–10.3)
Chloride: 100 mmol/L (ref 98–111)
Creatinine, Ser: 0.84 mg/dL (ref 0.44–1.00)
GFR, Estimated: >60 mL/min (ref >60)
Glucose, Bld: 126 mg/dL — ABNORMAL HIGH (ref 70–99)
Potassium: 4.1 mmol/L (ref 3.5–5.1)
Sodium: 141 mmol/L (ref 135–145)

## 2024-08-01 LAB — LACTIC ACID, PLASMA
Lactic Acid, Venous: 2.3 mmol/L (ref 0.5–1.9)
Lactic Acid, Venous: 2.6 mmol/L (ref 0.5–1.9)

## 2024-08-01 LAB — RESP PANEL BY RT-PCR (RSV, FLU A&B, COVID)  RVPGX2
Influenza A by PCR: NEGATIVE
Influenza B by PCR: NEGATIVE
Resp Syncytial Virus by PCR: NEGATIVE
SARS Coronavirus 2 by RT PCR: NEGATIVE

## 2024-08-01 LAB — PHOSPHORUS: Phosphorus: 3.4 mg/dL (ref 2.5–4.6)

## 2024-08-01 LAB — MAGNESIUM: Magnesium: 2.1 mg/dL (ref 1.7–2.4)

## 2024-08-01 LAB — HIV ANTIBODY (ROUTINE TESTING W REFLEX): HIV Screen 4th Generation wRfx: NONREACTIVE

## 2024-08-01 LAB — PROTIME-INR
INR: 0.9 (ref 0.8–1.2)
Prothrombin Time: 12.8 s (ref 11.4–15.2)

## 2024-08-01 LAB — PROCALCITONIN: Procalcitonin: <0.1 ng/mL

## 2024-08-01 MED ORDER — OLANZAPINE 5 MG PO TABS
10.0000 mg | ORAL_TABLET | Freq: Every day | ORAL | Status: DC
  Administered 2024-08-01: 10 mg via ORAL
  Filled 2024-08-01: qty 2

## 2024-08-01 MED ORDER — ENOXAPARIN SODIUM 40 MG/0.4ML IJ SOSY
40.0000 mg | PREFILLED_SYRINGE | INTRAMUSCULAR | Status: DC
  Administered 2024-08-01: 40 mg via SUBCUTANEOUS
  Filled 2024-08-01: qty 0.4

## 2024-08-01 MED ORDER — ONDANSETRON HCL 4 MG PO TABS: 4.0000 mg | ORAL_TABLET | Freq: Four times a day (QID) | ORAL | Status: DC | PRN

## 2024-08-01 MED ORDER — MAGNESIUM SULFATE 2 GM/50ML IV SOLN
2.0000 g | Freq: Once | INTRAVENOUS | Status: AC
  Administered 2024-08-01: 2 g via INTRAVENOUS
  Filled 2024-08-01: qty 50

## 2024-08-01 MED ORDER — ALPRAZOLAM 0.5 MG PO TABS
0.5000 mg | ORAL_TABLET | Freq: Three times a day (TID) | ORAL | Status: DC | PRN
Start: 2024-08-01 — End: 2024-08-02

## 2024-08-01 MED ORDER — FAMOTIDINE 20 MG PO TABS
20.0000 mg | ORAL_TABLET | Freq: Two times a day (BID) | ORAL | Status: DC
  Administered 2024-08-01 – 2024-08-02 (×2): 20 mg via ORAL
  Filled 2024-08-01 (×2): qty 1

## 2024-08-01 MED ORDER — VONOPRAZAN FUMARATE 20 MG PO TABS: 20.0000 mg | ORAL_TABLET | Freq: Every day | ORAL | Status: DC

## 2024-08-01 MED ORDER — METOPROLOL SUCCINATE ER 25 MG PO TB24
25.0000 mg | ORAL_TABLET | Freq: Every day | ORAL | Status: DC
  Administered 2024-08-01 – 2024-08-02 (×2): 25 mg via ORAL
  Filled 2024-08-01 (×2): qty 1

## 2024-08-01 MED ORDER — PREDNISONE 20 MG PO TABS
40.0000 mg | ORAL_TABLET | Freq: Every day | ORAL | Status: DC
  Administered 2024-08-02: 40 mg via ORAL
  Filled 2024-08-01: qty 2

## 2024-08-01 MED ORDER — AZITHROMYCIN 250 MG PO TABS
500.0000 mg | ORAL_TABLET | Freq: Every day | ORAL | Status: DC
  Administered 2024-08-02: 500 mg via ORAL
  Filled 2024-08-01: qty 2

## 2024-08-01 MED ORDER — OXYCODONE HCL 5 MG PO TABS: 5.0000 mg | ORAL_TABLET | ORAL | Status: DC | PRN

## 2024-08-01 MED ORDER — IPRATROPIUM-ALBUTEROL 0.5-2.5 (3) MG/3ML IN SOLN
3.0000 mL | Freq: Once | RESPIRATORY_TRACT | Status: AC
  Administered 2024-08-01: 3 mL via RESPIRATORY_TRACT
  Filled 2024-08-01 (×2): qty 3

## 2024-08-01 MED ORDER — FLEET ENEMA RE ENEM: 1.0000 | ENEMA | Freq: Once | RECTAL | Status: DC | PRN

## 2024-08-01 MED ORDER — BUDESONIDE 0.5 MG/2ML IN SUSP
2.0000 mg | Freq: Two times a day (BID) | RESPIRATORY_TRACT | Status: DC
  Administered 2024-08-01: 2 mg via RESPIRATORY_TRACT
  Administered 2024-08-02: 0.5 mg via RESPIRATORY_TRACT
  Filled 2024-08-01: qty 8

## 2024-08-01 MED ORDER — SODIUM CHLORIDE 0.9 % IV SOLN
500.0000 mg | Freq: Once | INTRAVENOUS | Status: DC
  Administered 2024-08-01: 500 mg via INTRAVENOUS
  Filled 2024-08-01: qty 5

## 2024-08-01 MED ORDER — HYDRALAZINE HCL 20 MG/ML IJ SOLN: 10.0000 mg | INTRAMUSCULAR | Status: DC | PRN

## 2024-08-01 MED ORDER — SODIUM CHLORIDE 0.9 % IV SOLN: INTRAVENOUS | Status: DC

## 2024-08-01 MED ORDER — SODIUM CHLORIDE 0.9 % IV SOLN
500.0000 mg | INTRAVENOUS | Status: AC
  Administered 2024-08-01: 500 mg via INTRAVENOUS
  Filled 2024-08-01: qty 5

## 2024-08-01 MED ORDER — ALBUTEROL SULFATE (2.5 MG/3ML) 0.083% IN NEBU
10.0000 mg/h | INHALATION_SOLUTION | Freq: Once | RESPIRATORY_TRACT | Status: AC
  Administered 2024-08-01: 10 mg/h via RESPIRATORY_TRACT
  Filled 2024-08-01 (×2): qty 12

## 2024-08-01 MED ORDER — IPRATROPIUM-ALBUTEROL 0.5-2.5 (3) MG/3ML IN SOLN
3.0000 mL | Freq: Four times a day (QID) | RESPIRATORY_TRACT | Status: DC
  Administered 2024-08-01 – 2024-08-02 (×3): 3 mL via RESPIRATORY_TRACT
  Filled 2024-08-01 (×4): qty 3

## 2024-08-01 MED ORDER — SENNOSIDES-DOCUSATE SODIUM 8.6-50 MG PO TABS: 1.0000 | ORAL_TABLET | Freq: Every evening | ORAL | Status: DC | PRN

## 2024-08-01 MED ORDER — LACTATED RINGERS IV BOLUS
500.0000 mL | Freq: Once | INTRAVENOUS | Status: AC
  Administered 2024-08-01: 500 mL via INTRAVENOUS

## 2024-08-01 MED ORDER — LACTATED RINGERS IV BOLUS
1000.0000 mL | Freq: Once | INTRAVENOUS | Status: AC
  Administered 2024-08-01: 1000 mL via INTRAVENOUS

## 2024-08-01 MED ORDER — SODIUM CHLORIDE 0.9% FLUSH
3.0000 mL | Freq: Two times a day (BID) | INTRAVENOUS | Status: DC
  Administered 2024-08-01 – 2024-08-02 (×3): 3 mL via INTRAVENOUS

## 2024-08-01 MED ORDER — SODIUM CHLORIDE 0.9% FLUSH
3.0000 mL | Freq: Two times a day (BID) | INTRAVENOUS | Status: DC
  Administered 2024-08-01 (×2): 3 mL via INTRAVENOUS

## 2024-08-01 MED ORDER — LEVALBUTEROL HCL 1.25 MG/0.5ML IN NEBU: 1.2500 mg | INHALATION_SOLUTION | Freq: Four times a day (QID) | RESPIRATORY_TRACT | Status: DC

## 2024-08-01 MED ORDER — DIVALPROEX SODIUM 250 MG PO DR TAB
250.0000 mg | DELAYED_RELEASE_TABLET | Freq: Two times a day (BID) | ORAL | Status: DC
  Administered 2024-08-01 – 2024-08-02 (×2): 250 mg via ORAL
  Filled 2024-08-01 (×2): qty 1

## 2024-08-01 MED ORDER — TRAZODONE HCL 50 MG PO TABS: 25.0000 mg | ORAL_TABLET | Freq: Every evening | ORAL | Status: DC | PRN

## 2024-08-01 MED ORDER — ONDANSETRON HCL 4 MG/2ML IJ SOLN: 4.0000 mg | Freq: Four times a day (QID) | INTRAMUSCULAR | Status: DC | PRN

## 2024-08-01 MED ORDER — BISACODYL 5 MG PO TBEC: 5.0000 mg | DELAYED_RELEASE_TABLET | Freq: Every day | ORAL | Status: DC | PRN

## 2024-08-01 MED ORDER — METHYLPREDNISOLONE SODIUM SUCC 125 MG IJ SOLR
125.0000 mg | Freq: Once | INTRAMUSCULAR | Status: AC
  Administered 2024-08-01: 125 mg via INTRAVENOUS
  Filled 2024-08-01: qty 2

## 2024-08-01 MED ORDER — METHYLPREDNISOLONE SODIUM SUCC 125 MG IJ SOLR
80.0000 mg | Freq: Two times a day (BID) | INTRAMUSCULAR | Status: AC
  Administered 2024-08-01 – 2024-08-02 (×2): 80 mg via INTRAVENOUS
  Filled 2024-08-01 (×2): qty 2

## 2024-08-01 MED ORDER — SODIUM CHLORIDE 0.9 % IV SOLN
1.0000 g | Freq: Once | INTRAVENOUS | Status: AC
  Administered 2024-08-01: 1 g via INTRAVENOUS
  Filled 2024-08-01: qty 10

## 2024-08-01 MED ORDER — HYDROMORPHONE HCL 1 MG/ML IJ SOLN: 0.5000 mg | INTRAMUSCULAR | Status: DC | PRN

## 2024-08-01 MED ORDER — IPRATROPIUM BROMIDE 0.02 % IN SOLN: 0.5000 mg | Freq: Four times a day (QID) | RESPIRATORY_TRACT | Status: DC

## 2024-08-01 MED ORDER — ACETAMINOPHEN 325 MG PO TABS: 650.0000 mg | ORAL_TABLET | Freq: Four times a day (QID) | ORAL | Status: DC | PRN

## 2024-08-01 MED ORDER — ACETAMINOPHEN 650 MG RE SUPP: 650.0000 mg | Freq: Four times a day (QID) | RECTAL | Status: DC | PRN

## 2024-08-01 NOTE — Assessment & Plan Note (Signed)
 Continue Pepcid

## 2024-08-01 NOTE — ED Provider Notes (Signed)
  EMERGENCY DEPARTMENT AT [facility] Provider Note   CSN: [id number] Arrival date & time: 08/01/24  9150     Patient presents with: Shortness of Breath  HPI [name] is a 58 y.o. female with COPD, hypertension, TIA, Barrett's esophagus presenting for shortness of breath.  Has been going on for 2 days and worse this morning.  Also reporting cough and a fever of 103 yesterday.  She reports that she checked her oxygen level at home and it was 79% last night.  She used her husband's oxygen through the night symptoms persisted this morning prompted her evaluation here today.  She denies chest pain at this time.  States she is wearing a Holter monitor.  Reports that she vapes daily.  Also reports that she used her nebulized albuterol  and inhaler this morning with minimal relief.    Shortness of Breath      Prior to Admission medications   Medication Sig Start Date End Date Taking? Authorizing Provider  albuterol  (VENTOLIN  HFA) 108 (90 Base) MCG/ACT inhaler Inhale 2 puffs into the lungs every 6 (six) hours as needed for shortness of breath. 03/25/23   Vivienne Delon HERO, PA-C  ALPRAZolam  (XANAX ) 0.5 MG tablet Take 0.5 mg by mouth 3 (three) times daily as needed.    [provider]  divalproex  (DEPAKOTE ) 250 MG DR tablet Take 250 mg by mouth 2 (two) times daily. 05/29/24   [provider]  famotidine  (PEPCID ) 20 MG tablet Take 1 tablet (20 mg total) by mouth 2 (two) times daily. 06/11/24   Ezzard Sonny GORMAN, PA-C  OLANZapine  (ZYPREXA ) 10 MG tablet Take half a tablet nightly for 2 weeks. Then increase to full tablet nightly thereafter. 11/28/23   Barbra Jayson LABOR, MD  torsemide (DEMADEX) 20 MG tablet Take 20 mg by mouth daily. 05/04/24   [provider]  venlafaxine  XR (EFFEXOR  XR) 75 MG 24 hr capsule Take 1 capsule (75 mg total) by mouth daily with breakfast. 11/28/23   Barbra Jayson LABOR, MD  Vonoprazan Fumarate  (VOQUEZNA ) 20 MG TABS Take 20 mg by mouth  daily. 06/17/24   Cindie Carlin POUR, DO  Vonoprazan Fumarate  (VOQUEZNA ) 20 MG TABS Take 20 mg by mouth daily. 07/26/24 07/26/25  Cindie Carlin POUR, DO    Allergies: Bee venom, Motrin [ibuprofen], Codeine, Penicillins, Prilosec [omeprazole], and Zantac [ranitidine]    Review of Systems  Respiratory:  Positive for shortness of breath.     Updated Vital Signs BP (!) 157/99   Pulse (!) 119   Temp 98.6 F (37 C) (Oral)   Resp (!) 23   Ht 5' 3 (1.6 m)   Wt 85.3 kg   SpO2 94%   BMI 33.30 kg/m   Physical Exam Vitals and nursing note reviewed.  HENT:     Head: Normocephalic and atraumatic.     Mouth/Throat:     Mouth: Mucous membranes are moist.  Eyes:     General:        Right eye: No discharge.        Left eye: No discharge.     Conjunctiva/sclera: Conjunctivae normal.  Cardiovascular:     Rate and Rhythm: Normal rate and regular rhythm.     Pulses: Normal pulses.     Heart sounds: Normal heart sounds.  Pulmonary:     Effort: Pulmonary effort is normal. Tachypnea present.     Breath sounds: Examination of the right-upper field reveals wheezing and rhonchi. Examination of the left-upper field  reveals wheezing and rhonchi. Examination of the right-middle field reveals wheezing and rhonchi. Examination of the left-middle field reveals wheezing and rhonchi. Examination of the right-lower field reveals wheezing and rhonchi. Examination of the left-lower field reveals wheezing and rhonchi. Wheezing and rhonchi present. No rales.     Comments: 89% on RA Abdominal:     General: Abdomen is flat.     Palpations: Abdomen is soft.  Skin:    General: Skin is warm and dry.  Neurological:     General: No focal deficit present.  Psychiatric:        Mood and Affect: Mood normal.     (all labs ordered are listed, but only abnormal results are displayed) Labs Reviewed  BASIC METABOLIC PANEL WITH GFR - Abnormal; Notable for the following components:      Result Value   Glucose, Bld 126  (*)    All other components within normal limits  CBC - Abnormal; Notable for the following components:   WBC 11.4 (*)    Hemoglobin 11.8 (*)    All other components within normal limits  LACTIC ACID, PLASMA - Abnormal; Notable for the following components:   Lactic Acid, Venous 2.3 (*)    All other components within normal limits  RESP PANEL BY RT-PCR (RSV, FLU A&B, COVID)  RVPGX2  CULTURE, BLOOD (ROUTINE X 2)  CULTURE, BLOOD (ROUTINE X 2)  LACTIC ACID, PLASMA    EKG: EKG Interpretation Date/Time:  Sunday August 01 2024 09:11:19 EST Ventricular Rate:  121 PR Interval:  134 QRS Duration:  91 QT Interval:  329 QTC Calculation: 467 R Axis:   80  Text Interpretation: Sinus tachycardia Ventricular premature complex Compared with prior EKG from 01/16/2023 Confirmed by Gennaro Bouchard ([id number]) on 08/01/2024 9:17:01 AM  Radiology: [facility] Chest Port 1 View Result Date: 08/01/2024 CLINICAL DATA:  Shortness of breath EXAM: PORTABLE CHEST 1 VIEW COMPARISON:  01/16/2023 FINDINGS: The heart size and mediastinal contours are within normal limits. Both lungs are clear. The visualized skeletal structures are unremarkable. Artifact overlies the chest. Trachea midline. Aorta atherosclerotic. Remote cholecystectomy. IMPRESSION: No active disease. Electronically Signed   By: CHRISTELLA.  Shick M.D.   On: 08/01/2024 09:41     .Critical Care  Performed by: Lang Norleen POUR, PA-C Authorized by: Lang Norleen POUR, PA-C   Critical care provider statement:    Critical care time (minutes):  30   Critical care was necessary to treat or prevent imminent or life-threatening deterioration of the following conditions:  Sepsis and respiratory failure (COPD exacerbation and respiratory distress with new oxygen requirement and questionable pneumonia meeting sepsis criteria.)   Critical care was time spent personally by me on the following activities:  Development of treatment plan with patient or surrogate, discussions with  consultants, evaluation of patient's response to treatment, examination of patient, ordering and review of laboratory studies, ordering and review of radiographic studies, ordering and performing treatments and interventions, pulse oximetry, re-evaluation of patient's condition and review of old charts    Medications Ordered in the ED  azithromycin  (ZITHROMAX ) 500 mg in sodium chloride  0.9 % 250 mL IVPB (500 mg Intravenous New Bag/Given 08/01/24 1118)  methylPREDNISolone  sodium succinate (SOLU-MEDROL ) 125 mg/2 mL injection 125 mg (125 mg Intravenous Given 08/01/24 0956)  albuterol  (PROVENTIL ) (2.5 MG/3ML) 0.083% nebulizer solution (10 mg/hr Nebulization Given 08/01/24 1005)  ipratropium-albuterol  (DUONEB) 0.5-2.5 (3) MG/3ML nebulizer solution 3 mL (3 mLs Nebulization Given 08/01/24 1005)  magnesium sulfate IVPB 2 g 50 mL (0 g Intravenous  Stopped 08/01/24 1106)  cefTRIAXone (ROCEPHIN) 1 g in sodium chloride  0.9 % 100 mL IVPB (0 g Intravenous Stopped 08/01/24 1119)  lactated ringers  bolus 1,000 mL (1,000 mLs Intravenous New Bag/Given 08/01/24 1119)                                    Medical Decision Making Amount and/or Complexity of Data Reviewed Labs: ordered. Radiology: ordered.  Risk Prescription drug management. Decision regarding hospitalization.   Discussed smoking cessation with patient and was they were offerred resources to help stop.  Total time was 5 min CPT code 00593.   Initial Impression and Ddx 58 yo female in respiratory distress presenting for shortness of breath. Exam notable for diffuse wheezing and rhonchi, tachypnea, hypoxia, and tachycardia.  DDx includes COPD exacerbation, pneumonia, sepsis, CHF exacerbation, pneumothorax, pleural effusion, other. Patient PMH that increases complexity of ED encounter:  COPD, hypertension, TIA, Barrett's esophagus  Interpretation of Diagnostics - I independent reviewed and interpreted the labs as followed: WBC 11.4, lactic 2.3  - I  independently visualized the following imaging with scope of interpretation limited to determining acute life threatening conditions related to emergency care: CXR, which revealed no acute findings  - I personally reviewed and interpreted EKG which revealed sinus tachycardia  Patient Reassessment and Ultimate Disposition/Management Workup suggestive of COPD exacerbation with new oxygen requirement.  Went ahead and treated her empirically for pneumonia as well given her persistent cough and reported fever yesterday.  Admitted to hospital service with Dr. Willette.  Tachycardic but stable on 2 L of nasal cannula at this time.  Patient management required discussion with the following services or consulting groups:  Hospitalist Service  Complexity of Problems Addressed Acute complicated illness or Injury  Additional Data Reviewed and Analyzed Further history obtained from: Past medical history and medications listed in the EMR and Prior ED visit notes  Patient Encounter Risk Assessment Consideration of hospitalization      Final diagnoses:  COPD exacerbation [facility])    ED Discharge Orders     None          Lang Norleen POUR, PA-C 08/01/24 1210    Francesca Elsie CROME, MD 08/01/24 [phone]

## 2024-08-01 NOTE — Assessment & Plan Note (Signed)
-   Smoking cessation, counseling -Offered NicoDerm patch

## 2024-08-01 NOTE — TOC CM/SW Note (Signed)
 Transition of Care [facility]) - Inpatient Brief Assessment   Patient Details  Name: [name] MRN: [id number] Date of Birth: October 18, 1965  Transition of Care [facility]) CM/SW Contact:    Noreen KATHEE Pinal, LCSWA Phone Number: 08/01/2024, 1:15 PM   Clinical Narrative:  Inpatient Care Management (ICM) has reviewed patient and no other ICM needs have been identified at this time. We will continue to monitor patient advancement through interdisciplinary progression rounds. If new patient transition needs arise, please place a ICM consult.   Transition of Care Asessment: Insurance and Status: Insurance coverage has been reviewed Patient has primary care physician: Yes Home environment has been reviewed: From Home Prior level of function:: Independent Prior/Current Home Services: No current home services Social Drivers of Health Review: SDOH reviewed no interventions necessary Readmission risk has been reviewed: Yes Transition of care needs: transition of care needs identified, TOC will continue to follow

## 2024-08-01 NOTE — Assessment & Plan Note (Signed)
-   Avoiding exacerbating agents such as albuterol

## 2024-08-01 NOTE — Assessment & Plan Note (Signed)
-   May be exacerbated by nebs,-albuterol   - Switching albuterol  to Xopenex -Will monitor closely

## 2024-08-01 NOTE — Assessment & Plan Note (Signed)
-   As needed Xanax -will be continued

## 2024-08-01 NOTE — Assessment & Plan Note (Addendum)
-   Acute hypoxic respiratory failure -Respiratory viral negative - COPD exacerbation-admitted to monitored floor and close observation - Continue DuoNeb bronchodilators, Xopenex and Atrovent  due to tachycardia prefer to use Xopenex - Will treat with systemic and oral steroids, continue on Pulmicort nebs - Encourage incentive spirometer and flutter valve - Continue supplemental oxygen maintaining O2 sat > 92% - Empiric p.o. antibiotics (patient received azithromycin , Rocephin in ED)

## 2024-08-01 NOTE — Hospital Course (Signed)
 [name] is a 58 year old female with extensive history of COPD, bipolar disorder, chronic tobacco use, TIAs, diet esophagus, GERD, presenting with progressive shortness of breath. Reporting her shortness of breath and cough started about 2 days ago and progressively getting worse also reporting of subjectively feeling warm possibly having it temperature yesterday.  Per patient checked her oxygen level on room air last night it was 79%.  Used her husband's oxygen throughout the night. Reports she has a history of tobacco abuse currently vaping.  Use her inhalers with no relief.   ED evaluation:  Blood pressure (!) 157/99, pulse (!) 119, temperature 98.6 F (37 C), RR  (!) 23, height 5' 3 (1.6 m), weight 85.3 kg, SpO2 94%.  Labs: CBC BMP within normal limits exception of lactic acid of 2.3, WBC of 11.4, respiratory panel all negative, Chest x-ray-no active disease Patient received azithromycin  Rocephin labs and IV steroids in ED Request to be admitted for COPD exacerbation

## 2024-08-01 NOTE — Plan of Care (Signed)

## 2024-08-01 NOTE — Assessment & Plan Note (Signed)
-   Reviewing her current medication pending confirmation -Will resume accordingly

## 2024-08-01 NOTE — Assessment & Plan Note (Signed)
-   BP currently stable, only home medication regarding BP is torsemide will hold for now -

## 2024-08-01 NOTE — ED Triage Notes (Signed)
 Patient come in POV for Shortness of breath that has been goin on for a couple of days stated her4 o2 was 79% last night and used husbands o2 through the night. Stated was running a rever yesterday. Wheezing noted. Denises chest pain, states is wearing a heart monitor.

## 2024-08-01 NOTE — Assessment & Plan Note (Addendum)
-   Likely respiratory lactic acidosis -Will continue to monitor -Treating Rester distress, COPD exacerbation as above - Obtaining procalcitonin level -Blood cultures were obtained, obtain sputum culture, -In ED received IV Rocephin, azithromycin , will continue azithromycin  for now

## 2024-08-01 NOTE — H&P (Signed)
 History and Physical   Patient: [name]                            PCP: Suanne Pfeiffer, NP                    DOB: [birth year]            DOA: 08/01/2024 FMW:[id number]             DOS: 08/01/2024, 12:24 PM  Suanne Pfeiffer, NP  Patient coming from:   HOME  I have personally reviewed patient's medical records, in electronic medical records, including:  [facility], and care everywhere.    Chief Complaint:   Chief Complaint  Patient presents with   Shortness of Breath    History of present illness:    [name] is a 58 year old female with extensive history of COPD, bipolar disorder, chronic tobacco use, TIAs, diet esophagus, GERD, presenting with progressive shortness of breath. Reporting her shortness of breath and cough started about 2 days ago and progressively getting worse also reporting of subjectively feeling warm possibly having it temperature yesterday.  Per patient checked her oxygen level on room air last night it was 79%.  Used her husband's oxygen throughout the night. Reports she has a history of tobacco abuse currently vaping.  Use her inhalers with no relief.   ED evaluation:  Blood pressure (!) 157/99, pulse (!) 119, temperature 98.6 F (37 C), RR  (!) 23, height 5' 3 (1.6 m), weight 85.3 kg, SpO2 94%.  Labs: CBC BMP within normal limits exception of lactic acid of 2.3, WBC of 11.4, respiratory panel all negative, Chest x-ray-no active disease Patient received azithromycin  Rocephin labs and IV steroids in ED Request to be admitted for COPD exacerbation    Patient Denies having: Chest Pain, Abd pain, N/V/D, headache, dizziness, lightheadedness,  Dysuria, Joint pain, rash, open wounds   Review of Systems: As per HPI, otherwise 10 point review of systems were negative.   ----------------------------------------------------------------------------------------------------------------------  Allergies  Allergen Reactions   Bee Venom  Anaphylaxis   Motrin [Ibuprofen] Other (See Comments)    Avoid due to hiatal hernia    Codeine Nausea And Vomiting and Rash   Penicillins Nausea And Vomiting and Rash   Prilosec [Omeprazole] Swelling and Rash   Zantac [Ranitidine] Rash    Home MEDs:  Prior to Admission medications   Medication Sig Start Date End Date Taking? Authorizing Provider  albuterol  (VENTOLIN  HFA) 108 (90 Base) MCG/ACT inhaler Inhale 2 puffs into the lungs every 6 (six) hours as needed for shortness of breath. 03/25/23   Vivienne Delon HERO, PA-C  ALPRAZolam  (XANAX ) 0.5 MG tablet Take 0.5 mg by mouth 3 (three) times daily as needed.    [provider]  divalproex  (DEPAKOTE ) 250 MG DR tablet Take 250 mg by mouth 2 (two) times daily. 05/29/24   [provider]  famotidine  (PEPCID ) 20 MG tablet Take 1 tablet (20 mg total) by mouth 2 (two) times daily. 06/11/24   Ezzard Sonny GORMAN, PA-C  OLANZapine  (ZYPREXA ) 10 MG tablet Take half a tablet nightly for 2 weeks. Then increase to full tablet nightly thereafter. 11/28/23   Barbra Jayson LABOR, MD  torsemide (DEMADEX) 20 MG tablet Take 20 mg by mouth daily. 05/04/24   [provider]  venlafaxine  XR (EFFEXOR  XR) 75 MG 24 hr capsule Take 1 capsule (75 mg total) by mouth daily with  breakfast. 11/28/23   Barbra Jayson LABOR, MD  Vonoprazan Fumarate  (VOQUEZNA ) 20 MG TABS Take 20 mg by mouth daily. 06/17/24   Cindie Carlin POUR, DO  Vonoprazan Fumarate  (VOQUEZNA ) 20 MG TABS Take 20 mg by mouth daily. 07/26/24 07/26/25  Cindie Carlin POUR, DO    PRN MEDs: acetaminophen  **OR** acetaminophen , ALPRAZolam , bisacodyl , hydrALAZINE , HYDROmorphone (DILAUDID) injection, ondansetron  **OR** ondansetron  (ZOFRAN ) IV, oxyCODONE, senna-docusate, sodium phosphate , traZODone   Past Medical History:  Diagnosis Date   Asthma    Barrett's esophagus    COPD (chronic obstructive pulmonary disease) (HCC)    Depression    Hypertension    PONV (postoperative nausea and vomiting)    Seasonal  allergies    TIA (transient ischemic attack) 01/2022    Past Surgical History:  Procedure Laterality Date   ABDOMINAL HYSTERECTOMY     BIOPSY  05/06/2022   Procedure: BIOPSY;  Surgeon: Cindie Carlin POUR, DO;  [facility]: [facility];  Service: Endoscopy;;   CHOLECYSTECTOMY     COLONOSCOPY  05/21/2011   Procedure: COLONOSCOPY;  Surgeon: Claudis RAYMOND Rivet, MD;  Location: [facility];  Service: Endoscopy;  Laterality: N/A;  12:30   COLONOSCOPY WITH PROPOFOL  N/A 07/23/2022   Procedure: COLONOSCOPY WITH PROPOFOL ;  Surgeon: Cindie Carlin POUR, DO;  Location: [facility];  Service: Endoscopy;  Laterality: N/A;  2:00pm, asa 2, pt knows to arrive at 11:30   ESOPHAGEAL DILATION  05/06/2022   Procedure: ESOPHAGEAL DILATION;  Surgeon: Cindie Carlin POUR, DO;  [facility]: [facility];  Service: Endoscopy;;   ESOPHAGOGASTRODUODENOSCOPY     ESOPHAGOGASTRODUODENOSCOPY N/A 06/17/2024   Procedure: EGD (ESOPHAGOGASTRODUODENOSCOPY);  Surgeon: Cindie Carlin POUR, DO;  Location: [facility];  Service: Endoscopy;  Laterality: N/A;  1:30 pm, ok rm 1-2   ESOPHAGOGASTRODUODENOSCOPY (EGD) WITH PROPOFOL  N/A 05/06/2022   Procedure: ESOPHAGOGASTRODUODENOSCOPY (EGD) WITH PROPOFOL ;  Surgeon: Cindie Carlin POUR, DO;  [facility]: [facility];  Service: Endoscopy;  Laterality: N/A;  8:00AM   POLYPECTOMY  07/23/2022   Procedure: POLYPECTOMY;  Surgeon: Cindie Carlin POUR, DO;  Location: [facility];  Service: Endoscopy;;   TUBAL LIGATION       reports that she has been smoking cigarettes and e-cigarettes. She started smoking about 16 months ago. She has a 11.5 pack-year smoking history. She has been exposed to tobacco smoke. She has never used smokeless tobacco. She reports that she does not drink alcohol and does not use drugs.   Family History  Problem Relation Age of Onset   Hypertension Mother    Hyperlipidemia Mother    Stroke Mother    Kidney cancer Father    Colon cancer Other     Physical Exam:   Vitals:    08/01/24 0911 08/01/24 0915 08/01/24 0930 08/01/24 1009  BP:  (!) 156/97 (!) 157/99   Pulse: (!) 120 (!) 124 (!) 119   Resp: (!) 22 17 (!) 23   Temp:      TempSrc:      SpO2: 90% 91% 95% 94%  Weight:      Height:       Constitutional: NAD, calm, comfortable Eyes: PERRL, lids and conjunctivae normal ENMT: Mucous membranes are moist. Posterior pharynx clear of any exudate or lesions.Normal dentition.  Neck: normal, supple, no masses, no thyromegaly Respiratory: Diffuse wheezing, rhonchi, negative crackles, mild use of accessories, Cardiovascular: Regular rate and rhythm, no murmurs / rubs / gallops. No extremity edema. 2+ pedal pulses. No carotid bruits.  Abdomen: no tenderness, no masses palpated. No  hepatosplenomegaly. Bowel sounds positive.  Musculoskeletal: no clubbing / cyanosis. No joint deformity upper and lower extremities. Good ROM, no contractures. Normal muscle tone.  Neurologic: CN II-XII grossly intact. Sensation intact, DTR normal. Strength 5/5 in all 4.  Psychiatric: Normal judgment and insight. Alert and oriented x 3. Normal mood.  Skin: no rashes, lesions, ulcers. No induration        Labs on admission:    I have personally reviewed following labs and imaging studies  CBC: Recent Labs  Lab 08/01/24 0939  WBC 11.4*  HGB 11.8*  HCT 36.8  MCV 90.6  PLT 320   Basic Metabolic Panel: Recent Labs  Lab 08/01/24 0939  NA 141  K 4.1  CL 100  CO2 29  GLUCOSE 126*  BUN 8  CREATININE 0.84  CALCIUM  9.4    Urine analysis:    Component Value Date/Time   COLORURINE YELLOW 12/26/2022 1734   APPEARANCEUR CLEAR 12/26/2022 1734   LABSPEC 1.009 12/26/2022 1734   PHURINE 7.0 12/26/2022 1734   GLUCOSEU NEGATIVE 12/26/2022 1734   HGBUR NEGATIVE 12/26/2022 1734   BILIRUBINUR negative 03/05/2023 1013   KETONESUR negative 03/05/2023 1013   KETONESUR NEGATIVE 12/26/2022 1734   PROTEINUR negative 03/05/2023 1013   PROTEINUR NEGATIVE 12/26/2022 1734    UROBILINOGEN 0.2 03/05/2023 1013   NITRITE Negative 03/05/2023 1013   NITRITE NEGATIVE 12/26/2022 1734   LEUKOCYTESUR Negative 03/05/2023 1013   LEUKOCYTESUR SMALL (A) 12/26/2022 1734    Last A1C:  Lab Results  Component Value Date   HGBA1C 5.5 01/01/2022     Radiologic Exams on Admission:   DG Chest Port 1 View Result Date: 08/01/2024 CLINICAL DATA:  Shortness of breath EXAM: PORTABLE CHEST 1 VIEW COMPARISON:  01/16/2023 FINDINGS: The heart size and mediastinal contours are within normal limits. Both lungs are clear. The visualized skeletal structures are unremarkable. Artifact overlies the chest. Trachea midline. Aorta atherosclerotic. Remote cholecystectomy. IMPRESSION: No active disease. Electronically Signed   By: CHRISTELLA.  Shick M.D.   On: 08/01/2024 09:41    EKG:   Independently reviewed.  Orders placed or performed during the hospital encounter of 08/01/24   ED EKG   ED EKG   EKG 12-Lead   EKG 12-Lead   EKG 12-Lead   ---------------------------------------------------------------------------------------------------------------------------------------    Assessment / Plan:   Principal Problem:   Acute hypoxic respiratory failure (HCC) Active Problems:   Paroxysmal SVT (supraventricular tachycardia)   Bipolar 1 disorder, depressed (HCC)   Essential hypertension   Tobacco abuse counseling   Palpitation   Gastroesophageal reflux disease   Current smoker   Generalized anxiety disorder   Lactic acidosis   Assessment and Plan: * Acute hypoxic respiratory failure (HCC) - Acute hypoxic respiratory failure -Respiratory viral negative - COPD exacerbation-admitted to monitored floor and close observation - Continue DuoNeb bronchodilators, Xopenex and Atrovent  due to tachycardia prefer to use Xopenex - Will treat with systemic and oral steroids, continue on Pulmicort nebs - Encourage incentive spirometer and flutter valve - Continue supplemental oxygen maintaining O2  sat > 92% - Empiric p.o. antibiotics (patient received azithromycin , Rocephin in ED)   Bipolar 1 disorder, depressed (HCC) - Reviewing her current medication pending confirmation -Will resume accordingly  Paroxysmal SVT (supraventricular tachycardia) - Avoiding exacerbating agents such as albuterol   Essential hypertension - BP currently stable, only home medication regarding BP is torsemide will hold for now -  Lactic acidosis - Likely respiratory lactic acidosis -Will continue to monitor -Treating Rester distress, COPD exacerbation as above -  Obtaining procalcitonin level -Blood cultures were obtained, obtain sputum culture, -In ED received IV Rocephin, azithromycin , will continue azithromycin  for now  Generalized anxiety disorder - As needed Xanax -will be continued  Current smoker - Smoking cessation, counseling -Offered NicoDerm patch  Gastroesophageal reflux disease - Continue Pepcid   Palpitation - May be exacerbated by nebs,-albuterol   - Switching albuterol  to Xopenex -Will monitor closely    Consults called:  None -------------------------------------------------------------------------------------------------------------------------------------------- DVT prophylaxis:  enoxaparin  (LOVENOX ) injection 40 mg Start: 08/01/24 2200 SCDs Start: 08/01/24 1206   Code Status:   Code Status: Full Code   Admission status: Patient will be admitted as Observation, with a greater than 2 midnight length of stay. Level of care: Telemetry   Family Communication:  none at bedside  (The above findings and plan of care has been discussed with patient in detail, the patient expressed understanding and agreement of above plan)  --------------------------------------------------------------------------------------------------------------------------------------------------  Disposition Plan:  Anticipated 1-2 days Status is: Observation The patient remains OBS appropriate  and will d/c before 2 midnights.     ----------------------------------------------------------------------------------------------------------------------------------------------------  Time spent:  51  Min.  Was spent seeing and evaluating the patient, reviewing all medical records, drawn plan of care.  SIGNED: Adriana DELENA Grams, MD, FHM. FAAFP. [facility], Pager  (Please use [email or web address] to page/ or secure chat through epic) If 7PM-7AM, please contact night-coverage [email or web address],  08/01/2024, 12:24 PM  For on call review [email or web address].

## 2024-08-02 LAB — CBC
HCT: 33.3 % — ABNORMAL LOW (ref 36.0–46.0)
Hemoglobin: 10.4 g/dL — ABNORMAL LOW (ref 12.0–15.0)
MCH: 28.7 pg (ref 26.0–34.0)
MCHC: 31.2 g/dL (ref 30.0–36.0)
MCV: 91.7 fL (ref 80.0–100.0)
Platelets: 288 K/uL (ref 150–400)
RBC: 3.63 MIL/uL — ABNORMAL LOW (ref 3.87–5.11)
RDW: 13.7 % (ref 11.5–15.5)
WBC: 12.2 K/uL — ABNORMAL HIGH (ref 4.0–10.5)
nRBC: 0 % (ref 0.0–0.2)

## 2024-08-02 LAB — BASIC METABOLIC PANEL WITH GFR
Anion gap: 10 (ref 5–15)
BUN: 15 mg/dL (ref 6–20)
CO2: 29 mmol/L (ref 22–32)
Calcium: 9.1 mg/dL (ref 8.9–10.3)
Chloride: 103 mmol/L (ref 98–111)
Creatinine, Ser: 0.72 mg/dL (ref 0.44–1.00)
GFR, Estimated: >60 mL/min (ref >60)
Glucose, Bld: 192 mg/dL — ABNORMAL HIGH (ref 70–99)
Potassium: 4.5 mmol/L (ref 3.5–5.1)
Sodium: 141 mmol/L (ref 135–145)

## 2024-08-02 LAB — GLUCOSE, CAPILLARY: Glucose-Capillary: 178 mg/dL — ABNORMAL HIGH (ref 70–99)

## 2024-08-02 MED ORDER — METOPROLOL SUCCINATE ER 25 MG PO TB24: 25.0000 mg | ORAL_TABLET | Freq: Every day | ORAL | Status: DC

## 2024-08-02 MED ORDER — VENLAFAXINE HCL ER 75 MG PO CP24: 75.0000 mg | ORAL_CAPSULE | Freq: Every day | ORAL | Status: DC

## 2024-08-02 MED ORDER — VENLAFAXINE HCL ER 75 MG PO CP24: 150.0000 mg | ORAL_CAPSULE | Freq: Every day | ORAL | Status: DC

## 2024-08-02 MED ORDER — METHYLPREDNISOLONE 4 MG PO TBPK: ORAL_TABLET | ORAL | 0 refills | Status: DC

## 2024-08-02 MED ORDER — AZITHROMYCIN 500 MG PO TABS: 500.0000 mg | ORAL_TABLET | Freq: Every day | ORAL | 0 refills | Status: AC

## 2024-08-02 NOTE — Progress Notes (Signed)
 Patient has discharge orders, discharge instructions given and no further questions at this time.

## 2024-08-02 NOTE — Progress Notes (Signed)
 SATURATION QUALIFICATIONS: (This note is used to comply with regulatory documentation for home oxygen)  Patient Saturations on Room Air at Rest = 96%  Patient Saturations on Room Air while Ambulating = 94%  Patient Saturations on 0 Liters of oxygen while Ambulating = 93-96%  Please briefly explain why patient needs home oxygen:

## 2024-08-02 NOTE — Progress Notes (Signed)
   08/02/24 1109  Assess: MEWS Score  Level of Consciousness Alert  Assess: MEWS Score  MEWS Temp 0  MEWS Systolic 0  MEWS Pulse 2  MEWS RR 0  MEWS LOC 0  MEWS Score 2  MEWS Score Color Yellow  Assess: if the MEWS score is Yellow or Red  Were vital signs accurate and taken at a resting state? No, vital signs rechecked  Does the patient meet 2 or more of the SIRS criteria? No  MEWS guidelines implemented  Yes, yellow  Treat  MEWS Interventions Considered administering scheduled or prn medications/treatments as ordered  Take Vital Signs  Increase Vital Sign Frequency  Yellow: Q2hr x1, continue Q4hrs until patient remains green for 12hrs  Escalate  MEWS: Escalate Yellow: Discuss with charge nurse and consider notifying provider and/or RRT  Notify: Charge Nurse/RN  Name of Charge Nurse/RN Notified Contractor

## 2024-08-02 NOTE — Progress Notes (Signed)
 Mobility Specialist Progress Note:    08/02/24 1037  Mobility  Activity Ambulated with assistance  Level of Assistance Independent  Assistive Device None  Distance Ambulated (ft) 300 ft  Range of Motion/Exercises Active;All extremities  Activity Response Tolerated well  Mobility Referral Yes  Mobility visit 1 Mobility  Mobility Specialist Start Time (ACUTE ONLY) 1037  Mobility Specialist Stop Time (ACUTE ONLY) 1054  Mobility Specialist Time Calculation (min) (ACUTE ONLY) 17 min   Pt received in bed, RN requesting O2 test. Independently able to stand and ambulate with no AD. Tolerated well, SpO2 96% on RA at rest, HR 117. SpO2 93-96% on RA during ambulation, HR 138. Left sitting EOB, all needs met.  Antoine Vandermeulen Mobility Specialist Please contact via Special Educational Needs Teacher or  Rehab office at [phone]

## 2024-08-02 NOTE — Discharge Summary (Signed)
 Physician Discharge Summary   Patient: [name] MRN: [id number] DOB: 1965-12-21  Admit date:     08/01/2024  Discharge date: 08/02/24  Discharge Physician: Adriana DELENA Grams   PCP: Suanne Pfeiffer, NP   Recommendations at discharge:   Follow with the pulmonologist in 1-2 weeks Follow the PCP in 1-2 weeks Continue the use of his inspirometer, flutter valve, continue rescue inhaler Q 4-6 hours Continue current course of antibiotics, taper off steroids  Discharge Diagnoses: Principal Problem:   Acute hypoxic respiratory failure (HCC) Active Problems:   Paroxysmal SVT (supraventricular tachycardia)   Bipolar 1 disorder, depressed (HCC)   Essential hypertension   Tobacco abuse counseling   Palpitation   Gastroesophageal reflux disease   Current smoker   Generalized anxiety disorder   Lactic acidosis  Resolved Problems:   * No resolved hospital problems. *  Hospital Course: [name] is a 58 year old female with extensive history of COPD, bipolar disorder, chronic tobacco use, TIAs, diet esophagus, GERD, presenting with progressive shortness of breath. Reporting her shortness of breath and cough started about 2 days ago and progressively getting worse also reporting of subjectively feeling warm possibly having it temperature yesterday.  Per patient checked her oxygen level on room air last night it was 79%.  Used her husband's oxygen throughout the night. Reports she has a history of tobacco abuse currently vaping.  Use her inhalers with no relief.   ED evaluation:  Blood pressure (!) 157/99, pulse (!) 119, temperature 98.6 F (37 C), RR  (!) 23, height 5' 3 (1.6 m), weight 85.3 kg, SpO2 94%.  Labs: CBC BMP within normal limits exception of lactic acid of 2.3, WBC of 11.4, respiratory panel all negative, Chest x-ray-no active disease Patient received azithromycin  Rocephin labs and IV steroids in ED Request to be admitted for COPD exacerbation   * Acute hypoxic  respiratory failure (HCC) - Acute hypoxic respiratory failure--- much improved Much improved -satting 93% on room air -Respiratory viral negative - COPD exacerbation-admitted to monitored floor and close observation - Was treated with DuoNeb bronchodilators, Xopenex and Atrovent  due to tachycardia prefer to use Xopenex - Was treated with systemic and oral steroids, continue on Pulmicort nebs - Courage to continue on spirometer and her flutter valve use --Supplemental oxygen as needed at home - Empiric p.o. antibiotics (patient received azithromycin , Rocephin in ED) azithromycin    Bipolar 1 disorder, depressed (HCC) - Reviewing her current medication pending confirmation -Will resume accordingly  Paroxysmal SVT (supraventricular tachycardia) - Avoiding exacerbating agents such as albuterol  Patient echocardiogram reviewed, EF within normal limits negative any structural heart abnormalities -  on Zio patch, to follow-up with cardiologist Tachycardia, exacerbated by albuterol  - Continue home medication of beta-blockers   Essential hypertension - BP currently stable, only home medication regarding BP is torsemide will hold for now -  Lactic acidosis - Likely respiratory lactic acidosis -due to COPD exacerbation-hypoxia -Resolved -In ED received IV Rocephin, azithromycin , will continue azithromycin    Generalized anxiety disorder - As needed Xanax -will be continued  Current smoker - Smoking cessation, counseling   Gastroesophageal reflux disease - Continue Pepcid     Disposition: Home Diet recommendation:  Discharge Diet Orders (From admission, onward)     Start     Ordered   08/02/24 0000  Diet - low sodium heart healthy        08/02/24 1008           Cardiac diet DISCHARGE MEDICATION: Allergies as of 08/02/2024  Reactions   Bee Venom Anaphylaxis   Motrin [ibuprofen] Other (See Comments)   Avoid due to hiatal hernia    Codeine Nausea And Vomiting, Rash    Penicillins Nausea And Vomiting, Rash   Prilosec [omeprazole] Swelling, Rash   Zantac [ranitidine] Rash        Medication List     STOP taking these medications    Voquezna  20 MG Tabs Generic drug: Vonoprazan Fumarate        TAKE these medications    albuterol  108 (90 Base) MCG/ACT inhaler Commonly known as: VENTOLIN  HFA Inhale 2 puffs into the lungs every 6 (six) hours as needed for shortness of breath.   ALPRAZolam  0.5 MG tablet Commonly known as: XANAX  Take 0.5 mg by mouth 3 (three) times daily as needed for anxiety.   azithromycin  500 MG tablet Commonly known as: Zithromax  Take 1 tablet (500 mg total) by mouth daily for 4 days. Start taking on: August 03, 2024   divalproex  250 MG DR tablet Commonly known as: DEPAKOTE  Take 250 mg by mouth 2 (two) times daily. Take 500 mg in the morning and 250 mg every evening.   famotidine  20 MG tablet Commonly known as: Pepcid  Take 1 tablet (20 mg total) by mouth 2 (two) times daily.   methylPREDNISolone  4 MG Tbpk tablet Commonly known as: MEDROL  DOSEPAK Medrol  Dosepak take as instructed   metoprolol  succinate 25 MG 24 hr tablet Commonly known as: TOPROL -XL Take 25 mg by mouth at bedtime.   OLANZapine  10 MG tablet Commonly known as: ZYPREXA  Take half a tablet nightly for 2 weeks. Then increase to full tablet nightly thereafter. What changed:  how much to take how to take this when to take this   venlafaxine  XR 150 MG 24 hr capsule Commonly known as: EFFEXOR -XR Take 150 mg by mouth at bedtime.        Discharge Exam: Filed Weights   08/01/24 0908 08/01/24 1438 08/02/24 0530  Weight: 85.3 kg 84.6 kg 84.5 kg        General:  AAO x 3,  cooperative, no distress;   HEENT:  Normocephalic, PERRL, otherwise with in Normal limits   Neuro:  CNII-XII intact. , normal motor and sensation, reflexes intact   Lungs:   Diffuse rhonchi, minimal wheezing, improved shortness of breath, negative any crackles  Cardio:     S1/S2, RRR, No murmure, No Rubs or Gallops   Abdomen:  Soft, non-tender, bowel sounds active all four quadrants, no guarding or peritoneal signs.  Muscular  skeletal:  Limited exam -global generalized weaknesses - in bed, able to move all 4 extremities,   2+ pulses,  symmetric, No pitting edema  Skin:  Dry, warm to touch, negative for any Rashes,  Wounds: Please see nursing documentation          Condition at discharge: good  The results of significant diagnostics from this hospitalization (including imaging, microbiology, ancillary and laboratory) are listed below for reference.   Imaging Studies: DG Chest Port 1 View Result Date: 08/01/2024 CLINICAL DATA:  Shortness of breath EXAM: PORTABLE CHEST 1 VIEW COMPARISON:  01/16/2023 FINDINGS: The heart size and mediastinal contours are within normal limits. Both lungs are clear. The visualized skeletal structures are unremarkable. Artifact overlies the chest. Trachea midline. Aorta atherosclerotic. Remote cholecystectomy. IMPRESSION: No active disease. Electronically Signed   By: CHRISTELLA.  Shick M.D.   On: 08/01/2024 09:41    Microbiology: Results for orders placed or performed during the hospital encounter of 08/01/24  Culture,  blood (routine x 2)     Status: None (Preliminary result)   Collection Time: 08/01/24  8:50 AM   Specimen: BLOOD RIGHT FOREARM  Result Value Ref Range Status   Specimen Description BLOOD RIGHT FOREARM  Final   Special Requests Blood Culture adequate volume  Final   Culture   Final    NO GROWTH < 24 HOURS Performed at [facility], [address], [place], [place] [address]    Report Status PENDING  Incomplete  Resp panel by RT-PCR (RSV, Flu A&B, Covid) Anterior Nasal Swab     Status: None   Collection Time: 08/01/24  9:13 AM   Specimen: Anterior Nasal Swab  Result Value Ref Range Status   SARS Coronavirus 2 by RT PCR NEGATIVE NEGATIVE Final    Comment: (NOTE) SARS-CoV-2 target nucleic acids are NOT  DETECTED.  The SARS-CoV-2 RNA is generally detectable in upper respiratory specimens during the acute phase of infection. The lowest concentration of SARS-CoV-2 viral copies this assay can detect is 138 copies/mL. A negative result does not preclude SARS-Cov-2 infection and should not be used as the sole basis for treatment or other patient management decisions. A negative result may occur with  improper specimen collection/handling, submission of specimen other than nasopharyngeal swab, presence of viral mutation(s) within the areas targeted by this assay, and inadequate number of viral copies(<138 copies/mL). A negative result must be combined with clinical observations, patient history, and epidemiological information. The expected result is Negative.  Fact Sheet for Patients:  bloggercourse.com  Fact Sheet for Healthcare Providers:  seriousbroker.it  This test is no t yet approved or cleared by the [place]  FDA and  has been authorized for detection and/or diagnosis of SARS-CoV-2 by FDA under an Emergency Use Authorization (EUA). This EUA will remain  in effect (meaning this test can be used) for the duration of the COVID-19 declaration under Section 564(b)(1) of the Act, 21 U.S.C.section 360bbb-3(b)(1), unless the authorization is terminated  or revoked sooner.       Influenza A by PCR NEGATIVE NEGATIVE Final   Influenza B by PCR NEGATIVE NEGATIVE Final    Comment: (NOTE) The Xpert Xpress SARS-CoV-2/FLU/RSV plus assay is intended as an aid in the diagnosis of influenza from Nasopharyngeal swab specimens and should not be used as a sole basis for treatment. Nasal washings and aspirates are unacceptable for Xpert Xpress SARS-CoV-2/FLU/RSV testing.  Fact Sheet for Patients: bloggercourse.com  Fact Sheet for Healthcare Providers: seriousbroker.it  This test is not yet  approved or cleared by the [place]  FDA and has been authorized for detection and/or diagnosis of SARS-CoV-2 by FDA under an Emergency Use Authorization (EUA). This EUA will remain in effect (meaning this test can be used) for the duration of the COVID-19 declaration under Section 564(b)(1) of the Act, 21 U.S.C. section 360bbb-3(b)(1), unless the authorization is terminated or revoked.     Resp Syncytial Virus by PCR NEGATIVE NEGATIVE Final    Comment: (NOTE) Fact Sheet for Patients: bloggercourse.com  Fact Sheet for Healthcare Providers: seriousbroker.it  This test is not yet approved or cleared by the [place]  FDA and has been authorized for detection and/or diagnosis of SARS-CoV-2 by FDA under an Emergency Use Authorization (EUA). This EUA will remain in effect (meaning this test can be used) for the duration of the COVID-19 declaration under Section 564(b)(1) of the Act, 21 U.S.C. section 360bbb-3(b)(1), unless the authorization is terminated or revoked.  Performed at [facility], [address], [place],  [place] [address]   Culture, blood (routine x 2)     Status: None (Preliminary result)   Collection Time: 08/01/24  9:39 AM   Specimen: BLOOD  Result Value Ref Range Status   Specimen Description BLOOD RIGHT ANTECUBITAL  Final   Special Requests   Final    BOTTLES DRAWN AEROBIC AND ANAEROBIC Blood Culture adequate volume   Culture   Final    NO GROWTH < 24 HOURS Performed at [facility], [address] [place], [place] [address]    Report Status PENDING  Incomplete    Labs: CBC: Recent Labs  Lab 08/01/24 0939 08/02/24 0405  WBC 11.4* 12.2*  HGB 11.8* 10.4*  HCT 36.8 33.3*  MCV 90.6 91.7  PLT 320 288   Basic Metabolic Panel: Recent Labs  Lab 08/01/24 0939 08/02/24 0405  NA 141 141  K 4.1 4.5  CL 100 103  CO2 29 29  GLUCOSE 126* 192*  BUN 8 15  CREATININE 0.84 0.72  CALCIUM  9.4 9.1  MG 2.1   --   PHOS 3.4  --    Liver Function Tests: No results for input(s): AST, ALT, ALKPHOS, BILITOT, PROT, ALBUMIN in the last 168 hours. CBG: Recent Labs  Lab 08/02/24 0729  GLUCAP 178*    Discharge time spent: greater than 30 minutes.  Signed: Adriana DELENA Grams, MD [facility] 08/02/2024

## 2024-08-06 LAB — CULTURE, BLOOD (ROUTINE X 2)
Culture: NO GROWTH
Culture: NO GROWTH
Special Requests: ADEQUATE
Special Requests: ADEQUATE

## 2024-08-12 ENCOUNTER — Encounter: Payer: Self-pay | Admitting: *Deleted

## 2024-08-12 ENCOUNTER — Ambulatory Visit (INDEPENDENT_AMBULATORY_CARE_PROVIDER_SITE_OTHER): Payer: Self-pay | Admitting: Gastroenterology

## 2024-08-12 ENCOUNTER — Encounter: Payer: Self-pay | Admitting: Gastroenterology

## 2024-08-12 VITALS — BP 126/83 | HR 96 | Temp 97.9°F | Ht 63.0 in | Wt 186.0 lb

## 2024-08-12 DIAGNOSIS — K2270 Barrett's esophagus without dysplasia: Secondary | ICD-10-CM

## 2024-08-12 DIAGNOSIS — K449 Diaphragmatic hernia without obstruction or gangrene: Secondary | ICD-10-CM

## 2024-08-12 DIAGNOSIS — R1310 Dysphagia, unspecified: Secondary | ICD-10-CM

## 2024-08-12 DIAGNOSIS — R112 Nausea with vomiting, unspecified: Secondary | ICD-10-CM

## 2024-08-12 DIAGNOSIS — K219 Gastro-esophageal reflux disease without esophagitis: Secondary | ICD-10-CM

## 2024-08-12 DIAGNOSIS — D508 Other iron deficiency anemias: Secondary | ICD-10-CM

## 2024-08-12 MED ORDER — DEXLANSOPRAZOLE 60 MG PO CPDR: 60.0000 mg | DELAYED_RELEASE_CAPSULE | Freq: Every day | ORAL | 3 refills | Status: AC

## 2024-08-12 MED ORDER — SUCRALFATE 1 G PO TABS: 1.0000 g | ORAL_TABLET | Freq: Three times a day (TID) | ORAL | 3 refills | Status: AC

## 2024-08-12 MED ORDER — VOQUEZNA 20 MG PO TABS: 1.0000 | ORAL_TABLET | Freq: Every day | ORAL | 3 refills | Status: DC

## 2024-08-12 NOTE — Progress Notes (Signed)
 Gastroenterology Office Note     Primary Care Physician:  Suanne Pfeiffer, NP  Primary Gastroenterologist: Dr. Cindie   Chief Complaint   Chief Complaint  Patient presents with   Follow-up    Still having issues with reflux     History of Present Illness   [name] is a 58 y.o. female presenting today with a history of chronic intermittent N/V dating back to 2023, chronic GERD/Barrett's, intermittent abdominal pain, right hepatic lobe lesion,  last seen in Sept 2025 and undergoing EGD in interim from when last seen.    EGD Sept 2025: LA grade D erosive esophagitis without bleeding s/p biopsy, medium-sized hiatal hernia with multiple Cmaeron ulcers, gastritis s/p biopsy, normal duodenum. Repeat in 12 weeks to evaluate response. Path: reactive gastropathy, negative H.pylori, chronic active gastritis with ulcer and ulcer slough on distal esophageal biopsies, interstinal metaplasia,.  She notes the back of her throat is burning. Taking mylanta. No help with this. On prednisone  taper for recent COPD exacerbation. Difficult to swallow at times.   Taking Pepcid . Voquezna  worked well but not able to pay out of pocket. She was unable to obtain patient assistance as this is not available if no insurance coverage.   Allergic reactions: omeprazole, pantoprazole, Zantac, breaks out in a rash.   MRI/MRCP with without contrast  06/25/24 at [facility]: right hepatic lobe lesion nonspecific and question sclerosing hemangioma or other benign lesion and has been present on multiple prior imaging modalities. Also noted small cyst of pancreas, 5 mm, no main duct dilatation, normal liver, recommending MRI in 1 year. ,     Prior evaluations:   Cortisol level normal Sept 2025  Alpha gal panel with low positive, borderline reaction to beef Celiac serologies normal  EGD Sept 2025: LA grade D erosive esophagitis without bleeding s/p biopsy, medium-sized hiatal hernia with multiple Cmaeron ulcers,  gastritis s/p biopsy, normal duodenum. Repeat in 12 weeks to evaluate response. Path: reactive gastropathy, negative H.pylori, chronic active gastritis with ulcer and ulcer slough on distal esophageal biopsies, interstinal metaplasia,.  GES: 04/2022 normal      EGD:8/23  - 3 cm hiatal hernia. - Esophageal mucosal changes consistent with short-segment Barrett's esophagus, confirmed with biopsy. - Mild Schatzki ring. Dilated. - Gastritis. Biopsied. No h.pylori - Normal duodenal bulb, first portion of the duodenum and second portion of the duodenum. -due EGD in 3 years   Last Colonoscopy: 10/23   - Non-bleeding internal hemorrhoids. - Diverticulosis in the sigmoid colon. - One 5 mm polyp in the descending colon, removed with a cold snare. Resected and retrieved. - The examination was otherwise normal. -tubular adenomas -next colonoscopy five years   CT A/P with contrast 09/2022: IMPRESSION: 1. Acute uncomplicated sigmoid colonic diverticulitis/colitis. 2. Wedge-shaped hypoenhancing areas in the left lower pole kidney with overlying cortical scarring, suggest correlation with laboratory values for pyelonephritis. 3.  Aortic Atherosclerosis (ICD10-I70.0).   CT chest with contrast 11/2022: IMPRESSION: -Few tiny bilateral lung nodules identified measuring under 5 mm. No follow-up needed if patient is low-risk (and has no known or suspected primary neoplasm). Non-contrast chest CT can be considered in 12 months if patient is high-risk. This recommendation follows the consensus statement: Guidelines for Management of Incidental Pulmonary Nodules Detected on CT Images: From the Fleischner Society 2017; Radiology 2017; 284:228-243. -No developing other mass lesion, fluid collection or lymph node enlargement in the thorax. -Fatty liver infiltration. -Coronary artery calcifications. Please correlate for other coronary risk factors.   Past Medical  History:  Diagnosis Date   Asthma     Barrett's esophagus    COPD (chronic obstructive pulmonary disease) (HCC)    Depression    Hypertension    PONV (postoperative nausea and vomiting)    Seasonal allergies    TIA (transient ischemic attack) 01/2022    Past Surgical History:  Procedure Laterality Date   ABDOMINAL HYSTERECTOMY     BIOPSY  05/06/2022   Procedure: BIOPSY;  Surgeon: Cindie Carlin POUR, DO;  [facility];  Service: Endoscopy;;   CHOLECYSTECTOMY     COLONOSCOPY  05/21/2011   Procedure: COLONOSCOPY;  Surgeon: Claudis RAYMOND Rivet, MD;  Location: [facility];  Service: Endoscopy;  Laterality: N/A;  12:30   COLONOSCOPY WITH PROPOFOL  N/A 07/23/2022   Procedure: COLONOSCOPY WITH PROPOFOL ;  Surgeon: Cindie Carlin POUR, DO;  [facility]: [facility];  Service: Endoscopy;  Laterality: N/A;  2:00pm, asa 2, pt knows to arrive at 11:30   ESOPHAGEAL DILATION  05/06/2022   Procedure: ESOPHAGEAL DILATION;  Surgeon: Cindie Carlin POUR, DO;  [facility];  Service: Endoscopy;;   ESOPHAGOGASTRODUODENOSCOPY     ESOPHAGOGASTRODUODENOSCOPY N/A 06/17/2024   Procedure: EGD (ESOPHAGOGASTRODUODENOSCOPY);  Surgeon: Cindie Carlin POUR, DO;  [facility];  Service: Endoscopy;  Laterality: N/A;  1:30 pm, ok rm 1-2   ESOPHAGOGASTRODUODENOSCOPY (EGD) WITH PROPOFOL  N/A 05/06/2022   Procedure: ESOPHAGOGASTRODUODENOSCOPY (EGD) WITH PROPOFOL ;  Surgeon: Cindie Carlin POUR, DO;  [facility];  Service: Endoscopy;  Laterality: N/A;  8:00AM   POLYPECTOMY  07/23/2022   Procedure: POLYPECTOMY;  Surgeon: Cindie Carlin POUR, DO;  [facility];  Service: Endoscopy;;   TUBAL LIGATION      Current Outpatient Medications  Medication Sig Dispense Refill   albuterol  (VENTOLIN  HFA) 108 (90 Base) MCG/ACT inhaler Inhale 2 puffs into the lungs every 6 (six) hours as needed for shortness of breath. 18 g 0   ALPRAZolam  (XANAX ) 0.5 MG tablet Take 0.5 mg by mouth 3 (three) times daily as needed for anxiety.     divalproex   (DEPAKOTE ) 250 MG DR tablet Take 250 mg by mouth 2 (two) times daily. Take 500 mg in the morning and 250 mg every evening.     famotidine  (PEPCID ) 20 MG tablet Take 1 tablet (20 mg total) by mouth 2 (two) times daily. 60 tablet 5   levofloxacin (LEVAQUIN) 750 MG tablet Take 750 mg by mouth daily.     metoprolol  succinate (TOPROL -XL) 25 MG 24 hr tablet Take 25 mg by mouth at bedtime.     OLANZapine  (ZYPREXA ) 10 MG tablet Take half a tablet nightly for 2 weeks. Then increase to full tablet nightly thereafter. (Patient taking differently: Take 10 mg by mouth at bedtime. Take half a tablet nightly for 2 weeks. Then increase to full tablet nightly thereafter.) 30 tablet 2   predniSONE  (DELTASONE ) 20 MG tablet Take 20 mg by mouth daily with breakfast.     venlafaxine  XR (EFFEXOR -XR) 150 MG 24 hr capsule Take 150 mg by mouth at bedtime.     No current facility-administered medications for this visit.    Allergies as of 08/12/2024 - Review Complete 08/12/2024  Allergen Reaction Noted   Bee venom Anaphylaxis 03/07/2021   Motrin [ibuprofen] Other (See Comments) 07/18/2022   Codeine Nausea And Vomiting and Rash 05/21/2011   Penicillins Nausea And Vomiting and Rash 05/21/2011   Prilosec [omeprazole] Swelling and Rash 03/15/2013   Zantac [ranitidine] Rash 03/07/2021    Family History  Problem Relation Age of Onset   Hypertension Mother    Hyperlipidemia Mother    Stroke Mother    Kidney cancer Father    Colon cancer Other     Social History   Socioeconomic History   Marital status: Married    Spouse name: Not on file   Number of children: 2   Years of education: 12   Highest education level: High school graduate  Occupational History   Occupation: [employer]  Tobacco Use   Smoking status: Every Day    Average packs/day: 0.5 packs/day for 23.0 years (11.5 ttl pk-yrs)    Types: Cigarettes, E-cigarettes    Start date: 03/2023    Passive exposure: Current   Smokeless tobacco: Never    Tobacco comments:    Quit cigarettes in July 2024 and Vape cartridge lasts 1.5 weeks  Vaping Use   Vaping status: Every Day  Substance and Sexual Activity   Alcohol use: No    Comment: Last drink in 2022   Drug use: No    Comment: See psychiatry note from 06/23/2023   Sexual activity: Yes    Birth control/protection: Surgical  Other Topics Concern   Not on file  Social History Narrative   Lives at home with husband.   Right-handed.   2 cups of caffeine per day.   Social Drivers of Corporate Investment Banker Strain: Low Risk  (10/21/2023)   Received from [facility]   Overall Financial Resource Strain (CARDIA)    Difficulty of Paying Living Expenses: Not very hard  Food Insecurity: No Food Insecurity (08/01/2024)   Hunger Vital Sign    Worried About Running Out of Food in the Last Year: Never true    Ran Out of Food in the Last Year: Never true  Transportation Needs: No Transportation Needs (08/01/2024)   PRAPARE - Administrator, Civil Service (Medical): No    Lack of Transportation (Non-Medical): No  Physical Activity: Not on file  Stress: Not on file  Social Connections: Not on file  Intimate Partner Violence: Not At Risk (08/01/2024)   Humiliation, Afraid, Rape, and Kick questionnaire    Fear of Current or Ex-Partner: No    Emotionally Abused: No    Physically Abused: No    Sexually Abused: No     Review of Systems   See HPI   Physical Exam   BP 126/83   Pulse 96   Temp 97.9 F (36.6 C) (Oral)   Ht 5' 3 (1.6 m)   Wt 186 lb (84.4 kg)   SpO2 98%   BMI 32.95 kg/m  General:   Alert and oriented. Pleasant and cooperative. Well-nourished and well-developed.  Head:  Normocephalic and atraumatic. Eyes:  Without icterus Abdomen:  +BS, soft, non-tender and non-distended. No HSM noted. No guarding or rebound. No masses appreciated.  Rectal:  Deferred  Msk:  Symmetrical without gross deformities. Normal posture. Extremities:  Without  edema. Neurologic:  Alert and  oriented x4;  grossly normal neurologically. Skin:  Intact without significant lesions or rashes. Psych:  Alert and cooperative. Normal mood and affect.  Lab Results  Component Value Date   WBC 12.2 (H) 08/02/2024   HGB 10.4 (L) 08/02/2024   HCT 33.3 (L) 08/02/2024   MCV 91.7 08/02/2024   PLT 288 08/02/2024   Aug 2025: iron 53, sats low at 15, ferritin 24, Hgb 12. No recent ferritin.  Hgb down to 10.4 while inpatient in Nov 2025 but no  overt GI bleeding.   Assessment    ** Chronic N/V in setting of uncontrolled GERD, medium-sized hiatal hernia likely contributing to refractory GERD and subsequent  N/V  ** Hx of Barrett's with severe esophagitis on recent EGD Sept 2025 and will need surveillance following PPI therapy  **Dysphagia due to severe esophagitis, may benefit from empiric dilation if esophagitis improved  **anemia while inpatient for COPD exacerbation, no overt GI bleeding, known Cameron ulcers contributing to underlying iron deficiency  **Stable liver lesion dating back many years likely benign  **small pancreatic cyst on MRI Oct 2025 through [facility], follow serially     PLAN   Samples of Voquezna  as this worked best for her. Unfortunately, she will not be a candidate for patient assistance as she has no insurance. Therefore, I provided those samples while we bridge her to Dexilant ; hopefully this is more cost efficient.  EGD to reassess esophagitis, possible dilation in the future Consider referral to [facility] for hiatal hernia repair in light of ongoing Cameron lesions, likely contributing to occult blood loss and exacerbating GERD, etc MRI in Oct 2026 for surveillance Update CBC and iron studies: colonoscopy fairly recent in 2023.  Follow up thereafter   Therisa MICAEL Stager, PhD, ANP-BC [facility]

## 2024-08-12 NOTE — Patient Instructions (Addendum)
 I have sent in Voquezna  again and provided samples. I am sending in Dexilant to see how much this costs without insurance. Reviewed with staff, and Voquezna  will still be high cost without insurance, unfortunately, as no patient assistance available.   We are arranging and upper endoscopy with dilation by Dr. Cindie in the near future!  We will then consider referral to surgery for hiatal hernia repair!  It was a pleasure to see you today. I want to create trusting relationships with patients and provide genuine, compassionate, and quality care. I truly value your feedback, so please be on the lookout for a survey regarding your visit with me today. I appreciate your time in completing this!         Angela MICAEL Stager, PhD, ANP-BC [facility]

## 2024-08-12 NOTE — H&P (View-Only) (Signed)
 Gastroenterology Office Note     Primary Care Physician:  Suanne Pfeiffer, NP  Primary Gastroenterologist: Dr. Cindie   Chief Complaint   Chief Complaint  Patient presents with   Follow-up    Still having issues with reflux     History of Present Illness   [name] is a 58 y.o. female presenting today with a history of chronic intermittent N/V dating back to 2023, chronic GERD/Barrett's, intermittent abdominal pain, right hepatic lobe lesion,  last seen in Sept 2025 and undergoing EGD in interim from when last seen.    EGD Sept 2025: LA grade D erosive esophagitis without bleeding s/p biopsy, medium-sized hiatal hernia with multiple Cmaeron ulcers, gastritis s/p biopsy, normal duodenum. Repeat in 12 weeks to evaluate response. Path: reactive gastropathy, negative H.pylori, chronic active gastritis with ulcer and ulcer slough on distal esophageal biopsies, interstinal metaplasia,.  She notes the back of her throat is burning. Taking mylanta. No help with this. On prednisone  taper for recent COPD exacerbation. Difficult to swallow at times.   Taking Pepcid . Voquezna  worked well but not able to pay out of pocket. She was unable to obtain patient assistance as this is not available if no insurance coverage.   Allergic reactions: omeprazole, pantoprazole, Zantac, breaks out in a rash.   MRI/MRCP with without contrast  06/25/24 at [facility]: right hepatic lobe lesion nonspecific and question sclerosing hemangioma or other benign lesion and has been present on multiple prior imaging modalities. Also noted small cyst of pancreas, 5 mm, no main duct dilatation, normal liver, recommending MRI in 1 year. ,     Prior evaluations:   Cortisol level normal Sept 2025  Alpha gal panel with low positive, borderline reaction to beef Celiac serologies normal  EGD Sept 2025: LA grade D erosive esophagitis without bleeding s/p biopsy, medium-sized hiatal hernia with multiple Cmaeron ulcers,  gastritis s/p biopsy, normal duodenum. Repeat in 12 weeks to evaluate response. Path: reactive gastropathy, negative H.pylori, chronic active gastritis with ulcer and ulcer slough on distal esophageal biopsies, interstinal metaplasia,.  GES: 04/2022 normal      EGD:8/23  - 3 cm hiatal hernia. - Esophageal mucosal changes consistent with short-segment Barrett's esophagus, confirmed with biopsy. - Mild Schatzki ring. Dilated. - Gastritis. Biopsied. No h.pylori - Normal duodenal bulb, first portion of the duodenum and second portion of the duodenum. -due EGD in 3 years   Last Colonoscopy: 10/23   - Non-bleeding internal hemorrhoids. - Diverticulosis in the sigmoid colon. - One 5 mm polyp in the descending colon, removed with a cold snare. Resected and retrieved. - The examination was otherwise normal. -tubular adenomas -next colonoscopy five years   CT A/P with contrast 09/2022: IMPRESSION: 1. Acute uncomplicated sigmoid colonic diverticulitis/colitis. 2. Wedge-shaped hypoenhancing areas in the left lower pole kidney with overlying cortical scarring, suggest correlation with laboratory values for pyelonephritis. 3.  Aortic Atherosclerosis (ICD10-I70.0).   CT chest with contrast 11/2022: IMPRESSION: -Few tiny bilateral lung nodules identified measuring under 5 mm. No follow-up needed if patient is low-risk (and has no known or suspected primary neoplasm). Non-contrast chest CT can be considered in 12 months if patient is high-risk. This recommendation follows the consensus statement: Guidelines for Management of Incidental Pulmonary Nodules Detected on CT Images: From the Fleischner Society 2017; Radiology 2017; 284:228-243. -No developing other mass lesion, fluid collection or lymph node enlargement in the thorax. -Fatty liver infiltration. -Coronary artery calcifications. Please correlate for other coronary risk factors.   Past Medical  History:  Diagnosis Date   Asthma     Barrett's esophagus    COPD (chronic obstructive pulmonary disease) (HCC)    Depression    Hypertension    PONV (postoperative nausea and vomiting)    Seasonal allergies    TIA (transient ischemic attack) 01/2022    Past Surgical History:  Procedure Laterality Date   ABDOMINAL HYSTERECTOMY     BIOPSY  05/06/2022   Procedure: BIOPSY;  Surgeon: Cindie Carlin POUR, DO;  [facility];  Service: Endoscopy;;   CHOLECYSTECTOMY     COLONOSCOPY  05/21/2011   Procedure: COLONOSCOPY;  Surgeon: Claudis RAYMOND Rivet, MD;  [facility];  Service: Endoscopy;  Laterality: N/A;  12:30   COLONOSCOPY WITH PROPOFOL  N/A 07/23/2022   Procedure: COLONOSCOPY WITH PROPOFOL ;  Surgeon: Cindie Carlin POUR, DO;  [facility];  Service: Endoscopy;  Laterality: N/A;  2:00pm, asa 2, pt knows to arrive at 11:30   ESOPHAGEAL DILATION  05/06/2022   Procedure: ESOPHAGEAL DILATION;  Surgeon: Cindie Carlin POUR, DO;  [facility];  Service: Endoscopy;;   ESOPHAGOGASTRODUODENOSCOPY     ESOPHAGOGASTRODUODENOSCOPY N/A 06/17/2024   Procedure: EGD (ESOPHAGOGASTRODUODENOSCOPY);  Surgeon: Cindie Carlin POUR, DO;  [facility];  Service: Endoscopy;  Laterality: N/A;  1:30 pm, ok rm 1-2   ESOPHAGOGASTRODUODENOSCOPY (EGD) WITH PROPOFOL  N/A 05/06/2022   Procedure: ESOPHAGOGASTRODUODENOSCOPY (EGD) WITH PROPOFOL ;  Surgeon: Cindie Carlin POUR, DO;  [facility];  Service: Endoscopy;  Laterality: N/A;  8:00AM   POLYPECTOMY  07/23/2022   Procedure: POLYPECTOMY;  Surgeon: Cindie Carlin POUR, DO;  [facility];  Service: Endoscopy;;   TUBAL LIGATION      Current Outpatient Medications  Medication Sig Dispense Refill   albuterol  (VENTOLIN  HFA) 108 (90 Base) MCG/ACT inhaler Inhale 2 puffs into the lungs every 6 (six) hours as needed for shortness of breath. 18 g 0   ALPRAZolam  (XANAX ) 0.5 MG tablet Take 0.5 mg by mouth 3 (three) times daily as needed for anxiety.     divalproex   (DEPAKOTE ) 250 MG DR tablet Take 250 mg by mouth 2 (two) times daily. Take 500 mg in the morning and 250 mg every evening.     famotidine  (PEPCID ) 20 MG tablet Take 1 tablet (20 mg total) by mouth 2 (two) times daily. 60 tablet 5   levofloxacin (LEVAQUIN) 750 MG tablet Take 750 mg by mouth daily.     metoprolol  succinate (TOPROL -XL) 25 MG 24 hr tablet Take 25 mg by mouth at bedtime.     OLANZapine  (ZYPREXA ) 10 MG tablet Take half a tablet nightly for 2 weeks. Then increase to full tablet nightly thereafter. (Patient taking differently: Take 10 mg by mouth at bedtime. Take half a tablet nightly for 2 weeks. Then increase to full tablet nightly thereafter.) 30 tablet 2   predniSONE  (DELTASONE ) 20 MG tablet Take 20 mg by mouth daily with breakfast.     venlafaxine  XR (EFFEXOR -XR) 150 MG 24 hr capsule Take 150 mg by mouth at bedtime.     No current facility-administered medications for this visit.    Allergies as of 08/12/2024 - Review Complete 08/12/2024  Allergen Reaction Noted   Bee venom Anaphylaxis 03/07/2021   Motrin [ibuprofen] Other (See Comments) 07/18/2022   Codeine Nausea And Vomiting and Rash 05/21/2011   Penicillins Nausea And Vomiting and Rash 05/21/2011   Prilosec [omeprazole] Swelling and Rash 03/15/2013   Zantac [ranitidine] Rash 03/07/2021    Family History  Problem Relation Age of Onset   Hypertension Mother    Hyperlipidemia Mother    Stroke Mother    Kidney cancer Father    Colon cancer Other     Social History   Socioeconomic History   Marital status: Married    Spouse name: Not on file   Number of children: 2   Years of education: 12   Highest education level: High school graduate  Occupational History   Occupation: [employer]  Tobacco Use   Smoking status: Every Day    Average packs/day: 0.5 packs/day for 23.0 years (11.5 ttl pk-yrs)    Types: Cigarettes, E-cigarettes    Start date: 03/2023    Passive exposure: Current   Smokeless tobacco: Never    Tobacco comments:    Quit cigarettes in July 2024 and Vape cartridge lasts 1.5 weeks  Vaping Use   Vaping status: Every Day  Substance and Sexual Activity   Alcohol use: No    Comment: Last drink in 2022   Drug use: No    Comment: See psychiatry note from 06/23/2023   Sexual activity: Yes    Birth control/protection: Surgical  Other Topics Concern   Not on file  Social History Narrative   Lives at home with husband.   Right-handed.   2 cups of caffeine per day.   Social Drivers of Corporate Investment Banker Strain: Low Risk  (10/21/2023)   Received from [facility]   Overall Financial Resource Strain (CARDIA)    Difficulty of Paying Living Expenses: Not very hard  Food Insecurity: No Food Insecurity (08/01/2024)   Hunger Vital Sign    Worried About Running Out of Food in the Last Year: Never true    Ran Out of Food in the Last Year: Never true  Transportation Needs: No Transportation Needs (08/01/2024)   PRAPARE - Administrator, Civil Service (Medical): No    Lack of Transportation (Non-Medical): No  Physical Activity: Not on file  Stress: Not on file  Social Connections: Not on file  Intimate Partner Violence: Not At Risk (08/01/2024)   Humiliation, Afraid, Rape, and Kick questionnaire    Fear of Current or Ex-Partner: No    Emotionally Abused: No    Physically Abused: No    Sexually Abused: No     Review of Systems   See HPI   Physical Exam   BP 126/83   Pulse 96   Temp 97.9 F (36.6 C) (Oral)   Ht 5' 3 (1.6 m)   Wt 186 lb (84.4 kg)   SpO2 98%   BMI 32.95 kg/m  General:   Alert and oriented. Pleasant and cooperative. Well-nourished and well-developed.  Head:  Normocephalic and atraumatic. Eyes:  Without icterus Abdomen:  +BS, soft, non-tender and non-distended. No HSM noted. No guarding or rebound. No masses appreciated.  Rectal:  Deferred  Msk:  Symmetrical without gross deformities. Normal posture. Extremities:  Without  edema. Neurologic:  Alert and  oriented x4;  grossly normal neurologically. Skin:  Intact without significant lesions or rashes. Psych:  Alert and cooperative. Normal mood and affect.  Lab Results  Component Value Date   WBC 12.2 (H) 08/02/2024   HGB 10.4 (L) 08/02/2024   HCT 33.3 (L) 08/02/2024   MCV 91.7 08/02/2024   PLT 288 08/02/2024   Aug 2025: iron 53, sats low at 15, ferritin 24, Hgb 12. No recent ferritin.  Hgb down to 10.4 while inpatient in Nov 2025 but no  overt GI bleeding.   Assessment    ** Chronic N/V in setting of uncontrolled GERD, medium-sized hiatal hernia likely contributing to refractory GERD and subsequent  N/V  ** Hx of Barrett's with severe esophagitis on recent EGD Sept 2025 and will need surveillance following PPI therapy  **Dysphagia due to severe esophagitis, may benefit from empiric dilation if esophagitis improved  **anemia while inpatient for COPD exacerbation, no overt GI bleeding, known Cameron ulcers contributing to underlying iron deficiency  **Stable liver lesion dating back many years likely benign  **small pancreatic cyst on MRI Oct 2025 through [facility], follow serially     PLAN   Samples of Voquezna  as this worked best for her. Unfortunately, she will not be a candidate for patient assistance as she has no insurance. Therefore, I provided those samples while we bridge her to Dexilant ; hopefully this is more cost efficient.  EGD to reassess esophagitis, possible dilation in the future Consider referral to [facility] for hiatal hernia repair in light of ongoing Cameron lesions, likely contributing to occult blood loss and exacerbating GERD, etc MRI in Oct 2026 for surveillance Update CBC and iron studies: colonoscopy fairly recent in 2023.  Follow up thereafter   Therisa MICAEL Stager, PhD, ANP-BC [facility]

## 2024-08-24 ENCOUNTER — Telehealth: Payer: Self-pay | Admitting: Gastroenterology

## 2024-08-24 NOTE — Telephone Encounter (Signed)
 Dena:  I have ordered a CBC and iron studies. I would like patient to complete this if possible in the near future. Thanks!

## 2024-08-25 ENCOUNTER — Telehealth: Payer: Self-pay

## 2024-08-25 NOTE — Telephone Encounter (Signed)
 Phoned and advised the pt of her needing labs drawn and where at. The pt expressed understanding and will go this week

## 2024-08-25 NOTE — Telephone Encounter (Signed)
 Copied from CRM #[id number]. Topic: General - Call Back - No Documentation >> Aug 23, 2024 11:36 AM [name] wrote: Reason for CRM: Patient is returning a missed call from the clinic, unable to find documentation of the call - advised patient that someone will be calling her back if needed.  Noted. No record of call, NFN.

## 2024-09-01 LAB — CBC WITH DIFFERENTIAL/PLATELET
Basophils Absolute: 0.1 x10E3/uL (ref 0.0–0.2)
Basos: 1 %
EOS (ABSOLUTE): 0.8 x10E3/uL — ABNORMAL HIGH (ref 0.0–0.4)
Eos: 7 %
Hematocrit: 37.4 % (ref 34.0–46.6)
Hemoglobin: 12.1 g/dL (ref 11.1–15.9)
Immature Grans (Abs): 0.1 x10E3/uL (ref 0.0–0.1)
Immature Granulocytes: 1 %
Lymphocytes Absolute: 2.1 x10E3/uL (ref 0.7–3.1)
Lymphs: 19 %
MCH: 28.5 pg (ref 26.6–33.0)
MCHC: 32.4 g/dL (ref 31.5–35.7)
MCV: 88 fL (ref 79–97)
Monocytes Absolute: 0.8 x10E3/uL (ref 0.1–0.9)
Monocytes: 7 %
Neutrophils Absolute: 7.1 x10E3/uL — ABNORMAL HIGH (ref 1.4–7.0)
Neutrophils: 65 %
Platelets: 379 x10E3/uL (ref 150–450)
RBC: 4.25 x10E6/uL (ref 3.77–5.28)
RDW: 14.4 % (ref 11.7–15.4)
WBC: 10.9 x10E3/uL — ABNORMAL HIGH (ref 3.4–10.8)

## 2024-09-01 LAB — IRON,TIBC AND FERRITIN PANEL
Ferritin: 26 ng/mL (ref 15–150)
Iron Saturation: 14 % — ABNORMAL LOW (ref 15–55)
Iron: 67 ug/dL (ref 27–159)
Total Iron Binding Capacity: 481 ug/dL — ABNORMAL HIGH (ref 250–450)
UIBC: 414 ug/dL (ref 131–425)

## 2024-09-09 ENCOUNTER — Ambulatory Visit (HOSPITAL_COMMUNITY): Payer: Self-pay | Admitting: Anesthesiology

## 2024-09-09 ENCOUNTER — Other Ambulatory Visit: Payer: Self-pay

## 2024-09-09 ENCOUNTER — Ambulatory Visit (HOSPITAL_COMMUNITY)
Admission: RE | Admit: 2024-09-09 | Discharge: 2024-09-09 | Disposition: A | Discharge status: Home / Self Care | Payer: Self-pay | Attending: Internal Medicine | Admitting: Internal Medicine

## 2024-09-09 ENCOUNTER — Encounter (HOSPITAL_COMMUNITY): Admission: RE | Disposition: A | Payer: Self-pay | Source: Home / Self Care | Attending: Internal Medicine

## 2024-09-09 DIAGNOSIS — Z09 Encounter for follow-up examination after completed treatment for conditions other than malignant neoplasm: Secondary | ICD-10-CM | POA: Insufficient documentation

## 2024-09-09 DIAGNOSIS — K449 Diaphragmatic hernia without obstruction or gangrene: Secondary | ICD-10-CM | POA: Insufficient documentation

## 2024-09-09 DIAGNOSIS — F17290 Nicotine dependence, other tobacco product, uncomplicated: Secondary | ICD-10-CM | POA: Insufficient documentation

## 2024-09-09 DIAGNOSIS — F418 Other specified anxiety disorders: Secondary | ICD-10-CM | POA: Insufficient documentation

## 2024-09-09 DIAGNOSIS — K2270 Barrett's esophagus without dysplasia: Secondary | ICD-10-CM | POA: Insufficient documentation

## 2024-09-09 DIAGNOSIS — K7689 Other specified diseases of liver: Secondary | ICD-10-CM | POA: Insufficient documentation

## 2024-09-09 DIAGNOSIS — Z87891 Personal history of nicotine dependence: Secondary | ICD-10-CM

## 2024-09-09 DIAGNOSIS — Z79899 Other long term (current) drug therapy: Secondary | ICD-10-CM | POA: Insufficient documentation

## 2024-09-09 DIAGNOSIS — F319 Bipolar disorder, unspecified: Secondary | ICD-10-CM | POA: Insufficient documentation

## 2024-09-09 DIAGNOSIS — K209 Esophagitis, unspecified without bleeding: Secondary | ICD-10-CM

## 2024-09-09 DIAGNOSIS — J9601 Acute respiratory failure with hypoxia: Secondary | ICD-10-CM

## 2024-09-09 DIAGNOSIS — K2950 Unspecified chronic gastritis without bleeding: Secondary | ICD-10-CM | POA: Insufficient documentation

## 2024-09-09 DIAGNOSIS — F17210 Nicotine dependence, cigarettes, uncomplicated: Secondary | ICD-10-CM | POA: Insufficient documentation

## 2024-09-09 DIAGNOSIS — J4489 Other specified chronic obstructive pulmonary disease: Secondary | ICD-10-CM | POA: Insufficient documentation

## 2024-09-09 DIAGNOSIS — K2100 Gastro-esophageal reflux disease with esophagitis, without bleeding: Secondary | ICD-10-CM | POA: Insufficient documentation

## 2024-09-09 DIAGNOSIS — I10 Essential (primary) hypertension: Secondary | ICD-10-CM | POA: Insufficient documentation

## 2024-09-09 HISTORY — PX: ESOPHAGOGASTRODUODENOSCOPY: SHX5428

## 2024-09-09 SURGERY — EGD (ESOPHAGOGASTRODUODENOSCOPY)
Anesthesia: General

## 2024-09-09 MED ORDER — PROPOFOL 500 MG/50ML IV EMUL
INTRAVENOUS | Status: DC | PRN
  Administered 2024-09-09: 10:00:00 100 mg via INTRAVENOUS
  Administered 2024-09-09: 10:00:00 150 ug/kg/min via INTRAVENOUS

## 2024-09-09 MED ORDER — LIDOCAINE 2% (20 MG/ML) 5 ML SYRINGE
INTRAMUSCULAR | Status: DC | PRN
  Administered 2024-09-09: 10:00:00 60 mg via INTRAVENOUS

## 2024-09-09 MED ORDER — LACTATED RINGERS IV SOLN: INTRAVENOUS | Status: DC

## 2024-09-09 MED ORDER — STERILE WATER FOR IRRIGATION IR SOLN
Status: DC | PRN
  Administered 2024-09-09: 10:00:00 60 mL

## 2024-09-09 NOTE — Discharge Instructions (Addendum)
 EGD Discharge instructions Please read the instructions outlined below and refer to this sheet in the next few weeks. These discharge instructions provide you with general information on caring for yourself after you leave the hospital. Your doctor may also give you specific instructions. While your treatment has been planned according to the most current medical practices available, unavoidable complications occasionally occur. If you have any problems or questions after discharge, please call your doctor. ACTIVITY You may resume your regular activity but move at a slower pace for the next 24 hours.  Take frequent rest periods for the next 24 hours.  Walking will help expel (get rid of) the air and reduce the bloated feeling in your abdomen.  No driving for 24 hours (because of the anesthesia (medicine) used during the test).  You may shower.  Do not sign any important legal documents or operate any machinery for 24 hours (because of the anesthesia used during the test).  NUTRITION Drink plenty of fluids.  You may resume your normal diet.  Begin with a light meal and progress to your normal diet.  Avoid alcoholic beverages for 24 hours or as instructed by your caregiver.  MEDICATIONS You may resume your normal medications unless your caregiver tells you otherwise.  WHAT YOU CAN EXPECT TODAY You may experience abdominal discomfort such as a feeling of fullness or gas pains.  FOLLOW-UP Your doctor will discuss the results of your test with you.  SEEK IMMEDIATE MEDICAL ATTENTION IF ANY OF THE FOLLOWING OCCUR: Excessive nausea (feeling sick to your stomach) and/or vomiting.  Severe abdominal pain and distention (swelling).  Trouble swallowing.  Temperature over 101 F (37.8 C).  Rectal bleeding or vomiting of blood.   Your upper endoscopy revealed medium size hiatal hernia.  Previously noted Cameron ulcers have healed.  Esophagus overall looks improved as well.  Esophagitis much better.   I did rebiopsy your lower esophagus to recheck on Barrett's.  Stomach and small bowel otherwise normal.  Continue on dexlansoprazole .  Follow-up in GI office in 3 months.  Message sent to office.    I hope you have a great rest of your week!  Carlin POUR. Cindie, D.O. Gastroenterology and Hepatology [facility]

## 2024-09-09 NOTE — Interval H&P Note (Signed)
 History and Physical Interval Note:  09/09/2024 9:47 AM  [name]  has presented today for surgery, with the diagnosis of dysphagia,esophagitis.  The various methods of treatment have been discussed with the patient and family. After consideration of risks, benefits and other options for treatment, the patient has consented to  Procedures with comments: EGD (ESOPHAGOGASTRODUODENOSCOPY) (N/A) - 9:30 am, asa 2 DILATION, ESOPHAGUS (N/A) as a surgical intervention.  The patient's history has been reviewed, patient examined, no change in status, stable for surgery.  I have reviewed the patient's chart and labs.  Questions were answered to the patient's satisfaction.     [name]

## 2024-09-09 NOTE — Op Note (Signed)
 [facility] Patient Name: [name] Procedure Date: 09/09/2024 9:44 AM MRN: [id number] Date of Birth: May 14, 1966 Attending MD: Carlin POUR. Cindie , [place], [id number] CSN: [id number] Age: 58 Admit Type: Outpatient Procedure:                Upper GI endoscopy Indications:              Follow-up of reflux esophagitis Providers:                Carlin POUR. Cindie, DO, Devere Lodge, Dorcas Lenis,                            Technician Referring MD:              Medicines:                See the Anesthesia note for documentation of the                            administered medications Complications:            No immediate complications. Estimated Blood Loss:     Estimated blood loss was minimal. Procedure:                Pre-Anesthesia Assessment:                           - The anesthesia plan was to use monitored                            anesthesia care (MAC).                           After obtaining informed consent, the endoscope was                            passed under direct vision. Throughout the                            procedure, the patient's blood pressure, pulse, and                            oxygen saturations were monitored continuously. The                            [id number] ([id number]) Upper was introduced through                            the mouth, and advanced to the second part of                            duodenum. The upper GI endoscopy was accomplished                            without difficulty. The patient tolerated the                            procedure well. Scope In: 9:54:48  AM Scope Out: 10:03:01 AM Total Procedure Duration: 0 hours 8 minutes 13 seconds  Findings:      A medium-sized hiatal hernia was present. Previously noted Cameron       erosions have healed      The esophagus and gastroesophageal junction were examined with white       light and narrow band imaging (NBI) from a forward view and retroflexed       position. There were  esophageal mucosal changes secondary to established       short-segment Barrett's disease. These changes involved the mucosa at       the upper extent of the gastric folds (30 cm from the incisors)       extending to the Z-line (27 cm from the incisors). Salmon-colored mucosa       was present. The maximum longitudinal extent of these esophageal mucosal       changes was 3 cm in length. Mucosa was biopsied with a cold forceps for       histology in 4 quadrants at intervals of 2 cm. The following biopsy       specimens were sent to pathology: targeted biopsy at 30 cm from the       incisors (1st Bottle) and targeted biopsy at 28 cm from the incisors       (2nd Bottle). A total of 2 specimen bottles were sent to pathology.      The entire examined stomach was normal.      The duodenal bulb, first portion of the duodenum and second portion of       the duodenum were normal. Impression:               - Medium-sized hiatal hernia.                           - Esophageal mucosal changes secondary to                            established short-segment Barrett's disease.                            Biopsied.                           - Normal stomach.                           - Normal duodenal bulb, first portion of the                            duodenum and second portion of the duodenum. Moderate Sedation:      Per Anesthesia Care Recommendation:           - Patient has a contact number available for                            emergencies. The signs and symptoms of potential                            delayed complications were discussed with the  patient. Return to normal activities tomorrow.                            Written discharge instructions were provided to the                            patient.                           - Resume previous diet.                           - Continue present medications.                           - Await pathology results.                            - Repeat upper endoscopy in 3 years for                            surveillance based on pathology results.                           - Return to GI clinic in 3 months.                           - Use Dexilant  (dexlansoprazole ) 60 mg PO daily.                           - Overall appears improved compared to prior.                            Cameron ulcers have healed. Esophagitis improved. Procedure Code(s):        --- Professional ---                           [phone], Esophagogastroduodenoscopy, flexible,                            transoral; with biopsy, single or multiple Diagnosis Code(s):        --- Professional ---                           K44.9, Diaphragmatic hernia without obstruction or                            gangrene                           K22.70, Barrett's esophagus without dysplasia                           K21.00, Gastro-esophageal reflux disease with                            esophagitis, without bleeding CPT copyright 2022  American Medical Association. All rights reserved. The codes documented in this report are preliminary and upon coder review may  be revised to meet current compliance requirements. Carlin POUR. Cindie, DO Carlin POUR. Nihaal Friesen, DO 09/09/2024 10:08:45 AM This report has been signed electronically. Number of Addenda: 0

## 2024-09-09 NOTE — Anesthesia Preprocedure Evaluation (Signed)
 Anesthesia Evaluation  Patient identified by MRN, date of birth, ID band Patient awake    Reviewed: Allergy & Precautions, H&P , NPO status , Patient's Chart, lab work & pertinent test results, reviewed documented beta blocker date and time   History of Anesthesia Complications (+) PONV and history of anesthetic complications  Airway Mallampati: II  TM Distance: >3 FB Neck ROM: full    Dental no notable dental hx.    Pulmonary neg pulmonary ROS, asthma , COPD, Patient abstained from smoking., former smoker   Pulmonary exam normal breath sounds clear to auscultation       Cardiovascular Exercise Tolerance: Good hypertension, + DOE  negative cardio ROS  Rhythm:regular Rate:Normal     Neuro/Psych  PSYCHIATRIC DISORDERS Anxiety Depression Bipolar Disorder   TIAnegative neurological ROS  negative psych ROS   GI/Hepatic negative GI ROS, Neg liver ROS,GERD  ,,  Endo/Other  negative endocrine ROS    Renal/GU negative Renal ROS  negative genitourinary   Musculoskeletal   Abdominal   Peds  Hematology negative hematology ROS (+)   Anesthesia Other Findings   Reproductive/Obstetrics negative OB ROS                              Anesthesia Physical Anesthesia Plan  ASA: 3  Anesthesia Plan: General   Post-op Pain Management:    Induction:   PONV Risk Score and Plan: Propofol  infusion  Airway Management Planned:   Additional Equipment:   Intra-op Plan:   Post-operative Plan:   Informed Consent: I have reviewed the patients History and Physical, chart, labs and discussed the procedure including the risks, benefits and alternatives for the proposed anesthesia with the patient or authorized representative who has indicated his/her understanding and acceptance.     Dental Advisory Given  Plan Discussed with: CRNA  Anesthesia Plan Comments:          Anesthesia Quick  Evaluation

## 2024-09-09 NOTE — Transfer of Care (Signed)
 Immediate Anesthesia Transfer of Care Note  Patient: [name]  Procedure(s) Performed: EGD (ESOPHAGOGASTRODUODENOSCOPY)  Patient Location: Short Stay  Anesthesia Type:MAC  Level of Consciousness: awake, alert , oriented, and patient cooperative  Airway & Oxygen Therapy: Patient Spontanous Breathing  Post-op Assessment: Report given to RN and Post -op Vital signs reviewed and stable  Post vital signs: Reviewed and stable  Last Vitals:  Vitals Value Taken Time  BP 140/70 09/09/24 10:11  Temp    Pulse 96 09/09/24 10:11  Resp 17 09/09/24 10:11  SpO2 96% on RA 09/09/24 10:11    Last Pain:  Vitals:   09/09/24 0950  TempSrc:   PainSc: 0-No pain      Patients Stated Pain Goal: 8 (09/09/24 0828)  Complications: No notable events documented.

## 2024-09-10 ENCOUNTER — Encounter (HOSPITAL_COMMUNITY): Payer: Self-pay | Admitting: Internal Medicine

## 2024-09-10 ENCOUNTER — Ambulatory Visit: Payer: Self-pay | Admitting: Gastroenterology

## 2024-09-10 LAB — SURGICAL PATHOLOGY

## 2024-09-12 NOTE — Anesthesia Postprocedure Evaluation (Signed)
"   Anesthesia Post Note  Patient: [name]  Procedure(s) Performed: EGD (ESOPHAGOGASTRODUODENOSCOPY)  Patient location during evaluation: Phase II Anesthesia Type: General Level of consciousness: awake Pain management: pain level controlled Vital Signs Assessment: post-procedure vital signs reviewed and stable Respiratory status: spontaneous breathing and respiratory function stable Cardiovascular status: blood pressure returned to baseline and stable Postop Assessment: no headache and no apparent nausea or vomiting Anesthetic complications: no Comments: Late entry   No notable events documented.   Last Vitals:  Vitals:   09/09/24 0828 09/09/24 1010  BP: 132/75 (!) 140/74  Pulse: 85 91  Resp: 16 13  Temp: 36.5 C 36.4 C  SpO2: 99% 100%    Last Pain:  Vitals:   09/09/24 1010  TempSrc: Oral  PainSc: 0-No pain                 Yvonna PARAS Qais Jowers      "

## 2024-09-13 ENCOUNTER — Other Ambulatory Visit: Payer: Self-pay

## 2024-09-13 DIAGNOSIS — D508 Other iron deficiency anemias: Secondary | ICD-10-CM

## 2024-09-15 ENCOUNTER — Emergency Department (HOSPITAL_COMMUNITY): Payer: Self-pay

## 2024-09-15 ENCOUNTER — Encounter (HOSPITAL_COMMUNITY): Payer: Self-pay | Admitting: *Deleted

## 2024-09-15 ENCOUNTER — Other Ambulatory Visit: Payer: Self-pay

## 2024-09-15 ENCOUNTER — Emergency Department (HOSPITAL_COMMUNITY)
Admission: EM | Admit: 2024-09-15 | Discharge: 2024-09-15 | Disposition: A | Discharge status: Home / Self Care | Payer: Self-pay | Attending: Emergency Medicine | Admitting: Emergency Medicine

## 2024-09-15 DIAGNOSIS — R600 Localized edema: Secondary | ICD-10-CM | POA: Insufficient documentation

## 2024-09-15 DIAGNOSIS — J449 Chronic obstructive pulmonary disease, unspecified: Secondary | ICD-10-CM | POA: Insufficient documentation

## 2024-09-15 DIAGNOSIS — I10 Essential (primary) hypertension: Secondary | ICD-10-CM | POA: Insufficient documentation

## 2024-09-15 DIAGNOSIS — Z79899 Other long term (current) drug therapy: Secondary | ICD-10-CM | POA: Insufficient documentation

## 2024-09-15 LAB — CBC WITH DIFFERENTIAL/PLATELET
Abs Immature Granulocytes: 0.08 K/uL — ABNORMAL HIGH (ref 0.00–0.07)
Basophils Absolute: 0.1 K/uL (ref 0.0–0.1)
Basophils Relative: 1 %
Eosinophils Absolute: 0.5 K/uL (ref 0.0–0.5)
Eosinophils Relative: 5 %
HCT: 33.7 % — ABNORMAL LOW (ref 36.0–46.0)
Hemoglobin: 10.7 g/dL — ABNORMAL LOW (ref 12.0–15.0)
Immature Granulocytes: 1 %
Lymphocytes Relative: 22 %
Lymphs Abs: 2.3 K/uL (ref 0.7–4.0)
MCH: 28.5 pg (ref 26.0–34.0)
MCHC: 31.8 g/dL (ref 30.0–36.0)
MCV: 89.6 fL (ref 80.0–100.0)
Monocytes Absolute: 1.1 K/uL — ABNORMAL HIGH (ref 0.1–1.0)
Monocytes Relative: 10 %
Neutro Abs: 6.4 K/uL (ref 1.7–7.7)
Neutrophils Relative %: 61 %
Platelets: 291 K/uL (ref 150–400)
RBC: 3.76 MIL/uL — ABNORMAL LOW (ref 3.87–5.11)
RDW: 15.1 % (ref 11.5–15.5)
WBC: 10.4 K/uL (ref 4.0–10.5)
nRBC: 0 % (ref 0.0–0.2)

## 2024-09-15 LAB — BASIC METABOLIC PANEL WITH GFR
Anion gap: 8 (ref 5–15)
BUN: 18 mg/dL (ref 6–20)
CO2: 33 mmol/L — ABNORMAL HIGH (ref 22–32)
Calcium: 9 mg/dL (ref 8.9–10.3)
Chloride: 99 mmol/L (ref 98–111)
Creatinine, Ser: 0.87 mg/dL (ref 0.44–1.00)
GFR, Estimated: >60 mL/min
Glucose, Bld: 191 mg/dL — ABNORMAL HIGH (ref 70–99)
Potassium: 3.6 mmol/L (ref 3.5–5.1)
Sodium: 140 mmol/L (ref 135–145)

## 2024-09-15 LAB — HEPATIC FUNCTION PANEL
ALT: 19 U/L (ref 0–44)
AST: 16 U/L (ref 15–41)
Albumin: 3.7 g/dL (ref 3.5–5.0)
Alkaline Phosphatase: 76 U/L (ref 38–126)
Bilirubin, Direct: 0.1 mg/dL (ref 0.0–0.2)
Total Bilirubin: <0.2 mg/dL (ref 0.0–1.2)
Total Protein: 6 g/dL — ABNORMAL LOW (ref 6.5–8.1)

## 2024-09-15 LAB — PRO BRAIN NATRIURETIC PEPTIDE: Pro Brain Natriuretic Peptide: 97.8 pg/mL

## 2024-09-15 LAB — MAGNESIUM: Magnesium: 2.2 mg/dL (ref 1.7–2.4)

## 2024-09-15 MED ORDER — FUROSEMIDE 10 MG/ML IJ SOLN
40.0000 mg | Freq: Once | INTRAMUSCULAR | Status: AC
  Administered 2024-09-15: 40 mg via INTRAVENOUS
  Filled 2024-09-15: qty 4

## 2024-09-15 MED ORDER — POTASSIUM CHLORIDE CRYS ER 20 MEQ PO TBCR
40.0000 meq | EXTENDED_RELEASE_TABLET | Freq: Once | ORAL | Status: AC
  Administered 2024-09-15: 40 meq via ORAL
  Filled 2024-09-15: qty 2

## 2024-09-15 NOTE — Discharge Instructions (Signed)
Be sure to follow-up with your physician. °

## 2024-09-15 NOTE — ED Provider Notes (Signed)
 10:19 AM Patient in no distress, awake, alert, sitting upright.  DVT study negative.   Garrick Charleston, MD 09/15/24 1019

## 2024-09-15 NOTE — ED Provider Notes (Signed)
 " [place] EMERGENCY DEPARTMENT AT [facility] Provider Note   CSN: [id number] Arrival date & time: 09/15/24  9557     Patient presents with: Leg Swelling   [name] is a 58 y.o. female.   HPI Patient presents for leg swelling.  Medical history includes bipolar disorder, anxiety, HTN, GERD, COPD, depression.  Bilateral leg swelling has worsened over the past several days.  She has been taking 80 mg Lasix  without relief.  She has had a recent cough.  She has had recent increase in leg swelling.  This has been bilateral.  She states that, at baseline, her left leg stays more swollen than the right.  With her leg swelling, she has had cramping pains.  This is primarily in her calf areas.  She has been taking Lasix , elevating her feet, and wearing compression stockings when on her feet.  She feels like the Lasix  increases her urine output for 1 hour and then it stops working.  She states that she called her cardiology office was advised to come to the ED.    Prior to Admission medications  Medication Sig Start Date End Date Taking? Authorizing Provider  albuterol  (VENTOLIN  HFA) 108 (90 Base) MCG/ACT inhaler Inhale 2 puffs into the lungs every 6 (six) hours as needed for shortness of breath. 03/25/23   Vivienne Delon HERO, PA-C  ALPRAZolam  (XANAX ) 0.5 MG tablet Take 0.5 mg by mouth 3 (three) times daily as needed for anxiety.    [provider]  dexlansoprazole  (DEXILANT ) 60 MG capsule Take 1 capsule (60 mg total) by mouth daily. 08/12/24   Shirlean Therisa ORN, NP  divalproex  (DEPAKOTE ) 250 MG DR tablet Take 250 mg by mouth 2 (two) times daily. Take 500 mg in the morning and 250 mg every evening. 05/29/24   [provider]  famotidine  (PEPCID ) 20 MG tablet Take 1 tablet (20 mg total) by mouth 2 (two) times daily. 06/11/24   Ezzard Sonny GORMAN, PA-C  metoprolol  succinate (TOPROL -XL) 25 MG 24 hr tablet Take 25 mg by mouth at bedtime. 07/30/24   [provider]   OLANZapine  (ZYPREXA ) 10 MG tablet Take half a tablet nightly for 2 weeks. Then increase to full tablet nightly thereafter. Patient taking differently: Take 10 mg by mouth at bedtime. Take half a tablet nightly for 2 weeks. Then increase to full tablet nightly thereafter. 11/28/23   Barbra Jayson LABOR, MD  predniSONE  (DELTASONE ) 20 MG tablet Take 20 mg by mouth daily with breakfast. 08/09/24   [provider]  sucralfate  (CARAFATE ) 1 g tablet Take 1 tablet (1 g total) by mouth 3 (three) times daily before meals. As needed 08/12/24   Shirlean Therisa ORN, NP  venlafaxine  XR (EFFEXOR -XR) 150 MG 24 hr capsule Take 150 mg by mouth at bedtime. 07/14/24   [provider]    Allergies: Bee venom, Motrin [ibuprofen], Codeine, Penicillins, Prilosec [omeprazole], and Zantac [ranitidine]    Review of Systems  Respiratory:  Positive for cough (Chronic).   Cardiovascular:  Positive for leg swelling.  Musculoskeletal:  Positive for myalgias.  All other systems reviewed and are negative.   Updated Vital Signs BP 117/85   Pulse 99   Temp 97.7 F (36.5 C) (Oral)   Resp 19   Ht 5' 3 (1.6 m)   Wt 84.4 kg   SpO2 94%   BMI 32.95 kg/m   Physical Exam Vitals and nursing note reviewed.  Constitutional:      General: She is  not in acute distress.    Appearance: Normal appearance. She is well-developed. She is not ill-appearing, toxic-appearing or diaphoretic.  HENT:     Head: Normocephalic and atraumatic.     Right Ear: External ear normal.     Left Ear: External ear normal.     Nose: Nose normal.     Mouth/Throat:     Mouth: Mucous membranes are moist.  Eyes:     Extraocular Movements: Extraocular movements intact.     Conjunctiva/sclera: Conjunctivae normal.  Cardiovascular:     Rate and Rhythm: Normal rate and regular rhythm.  Pulmonary:     Effort: Pulmonary effort is normal. No respiratory distress.  Abdominal:     General: There is no distension.     Palpations: Abdomen is  soft.  Musculoskeletal:        General: Tenderness present. No swelling.     Cervical back: Normal range of motion and neck supple.     Right lower leg: Edema present.     Left lower leg: Edema present.  Skin:    General: Skin is warm and dry.     Coloration: Skin is not jaundiced or pale.  Neurological:     General: No focal deficit present.     Mental Status: She is alert.  Psychiatric:        Mood and Affect: Mood normal.        Behavior: Behavior normal.     (all labs ordered are listed, but only abnormal results are displayed) Labs Reviewed  BASIC METABOLIC PANEL WITH GFR - Abnormal; Notable for the following components:      Result Value   CO2 33 (*)    Glucose, Bld 191 (*)    All other components within normal limits  CBC WITH DIFFERENTIAL/PLATELET - Abnormal; Notable for the following components:   RBC 3.76 (*)    Hemoglobin 10.7 (*)    HCT 33.7 (*)    Monocytes Absolute 1.1 (*)    Abs Immature Granulocytes 0.08 (*)    All other components within normal limits  HEPATIC FUNCTION PANEL - Abnormal; Notable for the following components:   Total Protein 6.0 (*)    All other components within normal limits  MAGNESIUM   PRO BRAIN NATRIURETIC PEPTIDE    EKG: None  Radiology: No results found.   Procedures   Medications Ordered in the ED  potassium chloride  SA (KLOR-CON  M) CR tablet 40 mEq (has no administration in time range)  furosemide  (LASIX ) injection 40 mg (has no administration in time range)                                    Medical Decision Making Amount and/or Complexity of Data Reviewed Labs: ordered.   Patient presenting for bilateral lower extremity swelling.  Vital signs on arrival notable for mild tachycardia.  On exam, she is well-appearing.  She does have bilateral leg swelling.  This is more prominent on the left.  She states that is baseline for her.  She has some mild warmth, primarily in area of left ankle.  She does have calf  tenderness.  She is not on a blood thinner.  Patient's symptoms are likely multifactorial lower extremity edema.  Given her cramping and myalgias, will check electrolytes today.  Plan will be for dose of IV Lasix  and DVT ultrasound.  Workup was initiated.  Patient's lab results were unremarkable.  A  dose of potassium chloride  was ordered for electrolyte optimization.  A dose of Lasix  was ordered for diuresis.  DVT study pending at time of signout.  Care of patient was signed out to oncoming ED provider.     Final diagnoses:  Bilateral lower extremity edema    ED Discharge Orders     None          Melvenia Motto, MD 09/15/24 [phone]  "

## 2024-09-15 NOTE — ED Triage Notes (Signed)
 Pt c/o bilateral leg swelling that has gotten worse over the last couple of days  Pt states she has been taking 80mg  of lasix  with no results of decrease in fluid  Pt states she has been coughing and states she has been treated for pneumonia with steroids and antibiotics for the last month

## 2024-09-25 ENCOUNTER — Emergency Department (HOSPITAL_COMMUNITY)
Admission: EM | Admit: 2024-09-25 | Discharge: 2024-09-25 | Disposition: A | Discharge status: Home / Self Care | Payer: Self-pay | Source: Home / Self Care | Attending: Emergency Medicine | Admitting: Emergency Medicine

## 2024-09-25 ENCOUNTER — Other Ambulatory Visit: Payer: Self-pay

## 2024-09-25 ENCOUNTER — Encounter (HOSPITAL_COMMUNITY): Payer: Self-pay | Admitting: Emergency Medicine

## 2024-09-25 ENCOUNTER — Emergency Department (HOSPITAL_COMMUNITY): Payer: Self-pay

## 2024-09-25 DIAGNOSIS — R600 Localized edema: Secondary | ICD-10-CM

## 2024-09-25 DIAGNOSIS — L03116 Cellulitis of left lower limb: Secondary | ICD-10-CM | POA: Insufficient documentation

## 2024-09-25 DIAGNOSIS — J449 Chronic obstructive pulmonary disease, unspecified: Secondary | ICD-10-CM | POA: Insufficient documentation

## 2024-09-25 LAB — CBC WITH DIFFERENTIAL/PLATELET
Abs Immature Granulocytes: 0.04 K/uL (ref 0.00–0.07)
Basophils Absolute: 0.1 K/uL (ref 0.0–0.1)
Basophils Relative: 1 %
Eosinophils Absolute: 1 K/uL — ABNORMAL HIGH (ref 0.0–0.5)
Eosinophils Relative: 10 %
HCT: 32.6 % — ABNORMAL LOW (ref 36.0–46.0)
Hemoglobin: 10.5 g/dL — ABNORMAL LOW (ref 12.0–15.0)
Immature Granulocytes: 0 %
Lymphocytes Relative: 23 %
Lymphs Abs: 2.1 K/uL (ref 0.7–4.0)
MCH: 28.9 pg (ref 26.0–34.0)
MCHC: 32.2 g/dL (ref 30.0–36.0)
MCV: 89.8 fL (ref 80.0–100.0)
Monocytes Absolute: 0.5 K/uL (ref 0.1–1.0)
Monocytes Relative: 5 %
Neutro Abs: 5.8 K/uL (ref 1.7–7.7)
Neutrophils Relative %: 61 %
Platelets: 235 K/uL (ref 150–400)
RBC: 3.63 MIL/uL — ABNORMAL LOW (ref 3.87–5.11)
RDW: 15.7 % — ABNORMAL HIGH (ref 11.5–15.5)
WBC: 9.5 K/uL (ref 4.0–10.5)
nRBC: 0 % (ref 0.0–0.2)

## 2024-09-25 LAB — COMPREHENSIVE METABOLIC PANEL WITH GFR
ALT: 36 U/L (ref 0–44)
AST: 31 U/L (ref 15–41)
Albumin: 3.9 g/dL (ref 3.5–5.0)
Alkaline Phosphatase: 101 U/L (ref 38–126)
Anion gap: 15 (ref 5–15)
BUN: 11 mg/dL (ref 6–20)
CO2: 22 mmol/L (ref 22–32)
Calcium: 8.6 mg/dL — ABNORMAL LOW (ref 8.9–10.3)
Chloride: 105 mmol/L (ref 98–111)
Creatinine, Ser: 0.72 mg/dL (ref 0.44–1.00)
GFR, Estimated: >60 mL/min
Glucose, Bld: 143 mg/dL — ABNORMAL HIGH (ref 70–99)
Potassium: 3.8 mmol/L (ref 3.5–5.1)
Sodium: 141 mmol/L (ref 135–145)
Total Bilirubin: 0.3 mg/dL (ref 0.0–1.2)
Total Protein: 6.3 g/dL — ABNORMAL LOW (ref 6.5–8.1)

## 2024-09-25 LAB — PRO BRAIN NATRIURETIC PEPTIDE: Pro Brain Natriuretic Peptide: 269 pg/mL

## 2024-09-25 MED ORDER — CEPHALEXIN 500 MG PO CAPS: 500.0000 mg | ORAL_CAPSULE | Freq: Two times a day (BID) | ORAL | 0 refills | Status: AC

## 2024-09-25 MED ORDER — FUROSEMIDE 10 MG/ML IJ SOLN
80.0000 mg | Freq: Once | INTRAMUSCULAR | Status: AC
  Administered 2024-09-25: 80 mg via INTRAVENOUS
  Filled 2024-09-25: qty 8

## 2024-09-25 NOTE — ED Triage Notes (Addendum)
 Pov c/o of bilateral leg swelling that worsened yesterday with left leg bothering pt more. Pt was seen here 12/24 for the same. Pt was given medications for the same. Pt is taking 80mg  fluid pill but pt states it has not been helping. Pt has a scheduled pcp appt for 1/13  Pt does have an open would to left leg that pt states was present last ED visit.

## 2024-09-25 NOTE — ED Provider Notes (Signed)
 " [place] EMERGENCY DEPARTMENT AT [facility] Provider Note   CSN: [id number] Arrival date & time: 09/25/24  8069     Patient presents with: Leg Swelling   [name] is a 59 y.o. female.  Patient is a 59 year old female with a history of COPD who presents to the ED for increasing leg swelling for the past 3 months.  Patient notes she has had intermittent leg swelling for several months that has worsened over the past 2 days.  Notes she was seen in the ED on Christmas Eve.  She states she was negative for blood clots at that time.  She has been taking Lasix  80 mg daily but unfortunately swelling has continued to get worse.  Notes the left leg has significantly worsened in the past couple days and notes redness to the top of the foot.  Denies fall or injury.  States she does not have a history of heart failure.  She states she has an appointment with her PCP on 1/13 but has not been evaluated since her last ED visit.  Notes she has never seen a vascular doctor previously.  No further complaints.   HPI     Prior to Admission medications  Medication Sig Start Date End Date Taking? Authorizing Provider  cephALEXin  (KEFLEX ) 500 MG capsule Take 1 capsule (500 mg total) by mouth 2 (two) times daily for 7 days. 09/25/24 10/02/24 Yes Treyon Wymore, Thersia GORMAN, PA-C  albuterol  (VENTOLIN  HFA) 108 (90 Base) MCG/ACT inhaler Inhale 2 puffs into the lungs every 6 (six) hours as needed for shortness of breath. 03/25/23   Vivienne Delon HERO, PA-C  ALPRAZolam  (XANAX ) 0.5 MG tablet Take 0.5 mg by mouth 3 (three) times daily as needed for anxiety.    [provider]  dexlansoprazole  (DEXILANT ) 60 MG capsule Take 1 capsule (60 mg total) by mouth daily. 08/12/24   Shirlean Therisa ORN, NP  divalproex  (DEPAKOTE ) 250 MG DR tablet Take 250 mg by mouth 2 (two) times daily. Take 500 mg in the morning and 250 mg every evening. 05/29/24   [provider]  famotidine  (PEPCID ) 20 MG tablet Take 1 tablet (20 mg  total) by mouth 2 (two) times daily. 06/11/24   Ezzard Sonny GORMAN, PA-C  metoprolol  succinate (TOPROL -XL) 25 MG 24 hr tablet Take 25 mg by mouth at bedtime. 07/30/24   [provider]  OLANZapine  (ZYPREXA ) 10 MG tablet Take half a tablet nightly for 2 weeks. Then increase to full tablet nightly thereafter. Patient taking differently: Take 10 mg by mouth at bedtime. Take half a tablet nightly for 2 weeks. Then increase to full tablet nightly thereafter. 11/28/23   Barbra Jayson LABOR, MD  predniSONE  (DELTASONE ) 20 MG tablet Take 20 mg by mouth daily with breakfast. 08/09/24   [provider]  sucralfate  (CARAFATE ) 1 g tablet Take 1 tablet (1 g total) by mouth 3 (three) times daily before meals. As needed 08/12/24   Shirlean Therisa ORN, NP  venlafaxine  XR (EFFEXOR -XR) 150 MG 24 hr capsule Take 150 mg by mouth at bedtime. 07/14/24   [provider]    Allergies: Bee venom, Motrin [ibuprofen], Codeine, Penicillins, Prilosec [omeprazole], and Zantac [ranitidine]    Review of Systems  Constitutional:  Negative for fever.  Respiratory:  Negative for shortness of breath.   Cardiovascular:  Positive for leg swelling. Negative for chest pain.  All other systems reviewed and are negative.   Updated Vital Signs BP (!) 147/93 (BP Location: Right Arm)  Pulse 99   Temp 98.3 F (36.8 C) (Oral)   Resp 20   Ht 5' 3 (1.6 m)   Wt 84.4 kg   SpO2 97%   BMI 32.95 kg/m   Physical Exam Constitutional:      Appearance: Normal appearance.  HENT:     Head: Normocephalic and atraumatic.     Nose: Nose normal.     Mouth/Throat:     Mouth: Mucous membranes are moist.     Pharynx: Oropharynx is clear.  Cardiovascular:     Rate and Rhythm: Normal rate.  Pulmonary:     Effort: Pulmonary effort is normal.     Breath sounds: Normal breath sounds.  Musculoskeletal:     Comments: For range of motion of bilateral lower extremities with equal strength.  PT pulses 2+ bilaterally.  2+ nonpitting  edema on the left compared to 1+ nonpitting edema on the right.  The dorsal left foot and lower leg are tender to palpation.  Mild erythema noted to the dorsal left foot.  No wounds noted foot.  She does have a chronic ulcerated wound present to the left lateral leg with no surrounding signs of cellulitis.  Skin:    General: Skin is warm and dry.  Neurological:     Mental Status: She is alert and oriented to person, place, and time.  Psychiatric:        Mood and Affect: Mood normal.        Behavior: Behavior normal.     (all labs ordered are listed, but only abnormal results are displayed) Labs Reviewed  COMPREHENSIVE METABOLIC PANEL WITH GFR - Abnormal; Notable for the following components:      Result Value   Glucose, Bld 143 (*)    Calcium  8.6 (*)    Total Protein 6.3 (*)    All other components within normal limits  CBC WITH DIFFERENTIAL/PLATELET - Abnormal; Notable for the following components:   RBC 3.63 (*)    Hemoglobin 10.5 (*)    HCT 32.6 (*)    RDW 15.7 (*)    Eosinophils Absolute 1.0 (*)    All other components within normal limits  PRO BRAIN NATRIURETIC PEPTIDE    EKG: None  Radiology: DG Chest Portable 1 View Result Date: 09/25/2024 CLINICAL DATA:  Leg edema short of breath EXAM: PORTABLE CHEST 1 VIEW COMPARISON:  09/15/2024, 01/16/2023 FINDINGS: No acute airspace disease or effusion. Normal cardiac size. Chronic interstitial opacities and airways thickening. No pneumothorax IMPRESSION: No active disease. Chronic interstitial opacities and airways thickening. Electronically Signed   By: Luke Bun M.D.   On: 09/25/2024 20:30     Medications Ordered in the ED  furosemide  (LASIX ) injection 80 mg (80 mg Intravenous Given 09/25/24 2018)    Clinical Course as of 09/25/24 2155  Sat Sep 25, 2024  1951 ultrasound for bilateral lower extremities for DVT on 12/24 negative for acute DVT or superficial thrombophlebitis [AY]  2029 Hemoglobin(!): 10.5 Stable compared to  previous [AY]    Clinical Course User Index [AY] Neysa Thersia RAMAN, PA-C                                Medical Decision Making Patient is a 59 year old female with a history of COPD who presents to the ED for increasing leg swelling for the past 3 months.  Notes increasing swelling in the left leg for the past 2 days as well as  increasing pain.  Patient's most recent visit to the ED was 12/24 for similar symptoms.  Please see detailed HPI above.  On exam patient is alert and in no acute distress.  Physical exam as noted above.  No acute respiratory distress noted.  Vital signs stable.  She does have bilateral lower extremity edema noted but good distal pulses.  Differential includes cellulitis, chronic venous insufficiency, DVT, CHF.  Patient was given Lasix  80 mg IV due to concerns for fluid overload.  BNP is 269 today.  This is slightly elevated from previous but no acute signs of fluid overload.  Chest x-ray reviewed and negative for pleural effusions.  Otherwise labs are reassuring with stable hemoglobin and no leukocytosis.  DVT studies from previous ED visit 2 weeks prior reviewed and was negative for acute DVT.  Less concerns for new emergent DVT as swelling has been ongoing for several months.  She is otherwise well-appearing with stable vital signs including oxygen on room air.  Stable for discharge home and outpatient treatment.  Will treat empirically for cellulitis due to redness noted to the left foot.  However patient symptoms most likely secondary to venous insufficiency due to chronicity of symptoms and wound on the left lower extremity.  Will give resources for vascular surgery follow-up as symptoms have been ongoing for 3 months.  Advised to continue Lasix  as prescribed and follow-up with PCP as scheduled next week as well.  She understands plan and is agreeable.  Return precautions provided.  Amount and/or Complexity of Data Reviewed Labs: ordered. Decision-making details documented in  ED Course. Radiology: ordered.  Risk Prescription drug management.       Final diagnoses:  Bilateral lower extremity edema  Cellulitis of left foot    ED Discharge Orders          Ordered    cephALEXin  (KEFLEX ) 500 MG capsule  2 times daily        09/25/24 2148               Neysa Thersia RAMAN, [place] 09/25/24 2155  "

## 2024-09-25 NOTE — Discharge Instructions (Signed)
 Begin Keflex  as prescribed.  Continue to take Lasix .  Elevate legs at nighttime and continue to wear compression stockings to help with leg swelling.  Please call vascular surgery next week to schedule for continued leg swelling that has ongoing for over 3 months.  Follow-up with primary care doctor as scheduled.  Return to ED if any symptoms worsen including new fevers, worsening redness spreading up the leg, difficulty breathing.
